# Patient Record
Sex: Male | Born: 1935 | Race: White | Hispanic: No | Smoking: Former smoker
Health system: Southern US, Community
[De-identification: ages and names within clinical notes are randomized; demographics above are authoritative.]

## PROBLEM LIST (undated history)

## (undated) DIAGNOSIS — K922 Gastrointestinal hemorrhage, unspecified: Secondary | ICD-10-CM

## (undated) DIAGNOSIS — H9190 Unspecified hearing loss, unspecified ear: Secondary | ICD-10-CM

## (undated) DIAGNOSIS — I739 Peripheral vascular disease, unspecified: Secondary | ICD-10-CM

## (undated) DIAGNOSIS — N189 Chronic kidney disease, unspecified: Secondary | ICD-10-CM

## (undated) DIAGNOSIS — E119 Type 2 diabetes mellitus without complications: Secondary | ICD-10-CM

## (undated) DIAGNOSIS — C8590 Non-Hodgkin lymphoma, unspecified, unspecified site: Secondary | ICD-10-CM

## (undated) DIAGNOSIS — I499 Cardiac arrhythmia, unspecified: Secondary | ICD-10-CM

## (undated) DIAGNOSIS — I10 Essential (primary) hypertension: Secondary | ICD-10-CM

## (undated) DIAGNOSIS — E781 Pure hyperglyceridemia: Secondary | ICD-10-CM

## (undated) DIAGNOSIS — I4891 Unspecified atrial fibrillation: Secondary | ICD-10-CM

## (undated) DIAGNOSIS — K5090 Crohn's disease, unspecified, without complications: Secondary | ICD-10-CM

## (undated) HISTORY — PX: APPENDECTOMY: SHX54

## (undated) HISTORY — DX: Pure hyperglyceridemia: E78.1

## (undated) HISTORY — PX: TURP VAPORIZATION: SUR1397

---

## 2010-05-09 ENCOUNTER — Ambulatory Visit: Payer: Self-pay | Admitting: Ophthalmology

## 2010-05-20 ENCOUNTER — Ambulatory Visit: Payer: Self-pay | Admitting: Ophthalmology

## 2010-07-03 ENCOUNTER — Ambulatory Visit: Payer: Self-pay | Admitting: Ophthalmology

## 2010-07-08 ENCOUNTER — Ambulatory Visit: Payer: Self-pay | Admitting: Ophthalmology

## 2011-06-24 ENCOUNTER — Ambulatory Visit: Payer: Self-pay | Admitting: Physician Assistant

## 2013-10-10 ENCOUNTER — Ambulatory Visit: Payer: Self-pay | Admitting: Podiatry

## 2013-10-26 ENCOUNTER — Encounter: Payer: Self-pay | Admitting: *Deleted

## 2013-10-31 ENCOUNTER — Ambulatory Visit (INDEPENDENT_AMBULATORY_CARE_PROVIDER_SITE_OTHER): Payer: Medicare Other | Admitting: Podiatry

## 2013-10-31 ENCOUNTER — Encounter: Payer: Self-pay | Admitting: Podiatry

## 2013-10-31 VITALS — BP 136/83 | HR 75 | Resp 22 | Ht 68.0 in | Wt 180.0 lb

## 2013-10-31 DIAGNOSIS — M79609 Pain in unspecified limb: Secondary | ICD-10-CM

## 2013-10-31 DIAGNOSIS — B351 Tinea unguium: Secondary | ICD-10-CM

## 2013-10-31 NOTE — Progress Notes (Signed)
Spencer Acosta presents today with a chief complaint of painful toenails one through 5 bilateral.  Objective: Pulses are strongly palpable bilateral nails are thick yellow dystrophic clinically mycotic and painful on palpation as well as debridement.  Assessment: Pain in limb secondary to onychomycosis 1 through 5 bilateral.  Plan: Debridement of nails 1 through 5 bilateral covered service secondary to pain.

## 2014-01-30 ENCOUNTER — Ambulatory Visit: Payer: Medicare Other | Admitting: Podiatry

## 2014-02-01 ENCOUNTER — Encounter: Payer: Self-pay | Admitting: Podiatry

## 2014-02-01 ENCOUNTER — Ambulatory Visit (INDEPENDENT_AMBULATORY_CARE_PROVIDER_SITE_OTHER): Payer: Medicare Other | Admitting: Podiatry

## 2014-02-01 VITALS — BP 130/81 | HR 79 | Resp 18

## 2014-02-01 DIAGNOSIS — M79609 Pain in unspecified limb: Secondary | ICD-10-CM

## 2014-02-01 DIAGNOSIS — B351 Tinea unguium: Secondary | ICD-10-CM

## 2014-02-01 NOTE — Progress Notes (Signed)
Trim toenails.  Objective: Vital signs are stable alert and oriented x3. Nails are thick yellow dystrophic with mycotic and painful palpation.  Assessment: Pain in limb secondary to onychomycosis 1 through 5 bilateral.  Plan: Debridement of nails 1 through 5 bilateral covered service secondary to pain.

## 2014-03-11 DIAGNOSIS — E781 Pure hyperglyceridemia: Secondary | ICD-10-CM

## 2014-03-11 DIAGNOSIS — I739 Peripheral vascular disease, unspecified: Secondary | ICD-10-CM | POA: Insufficient documentation

## 2014-03-11 DIAGNOSIS — I10 Essential (primary) hypertension: Secondary | ICD-10-CM | POA: Insufficient documentation

## 2014-03-11 DIAGNOSIS — K5010 Crohn's disease of large intestine without complications: Secondary | ICD-10-CM | POA: Insufficient documentation

## 2014-03-11 DIAGNOSIS — E119 Type 2 diabetes mellitus without complications: Secondary | ICD-10-CM | POA: Insufficient documentation

## 2014-03-11 HISTORY — DX: Pure hyperglyceridemia: E78.1

## 2014-05-10 ENCOUNTER — Ambulatory Visit (INDEPENDENT_AMBULATORY_CARE_PROVIDER_SITE_OTHER): Payer: Medicare Other | Admitting: Podiatry

## 2014-05-10 ENCOUNTER — Encounter: Payer: Self-pay | Admitting: Podiatry

## 2014-05-10 DIAGNOSIS — M79609 Pain in unspecified limb: Secondary | ICD-10-CM

## 2014-05-10 DIAGNOSIS — B351 Tinea unguium: Secondary | ICD-10-CM

## 2014-05-10 NOTE — Progress Notes (Signed)
He presents today chief complaint of painful elongated toenails.  Objective: Nails are thick yellow dystrophic onychomycotic and painful palpation.  Assessment: Pain in limb secondary to onychomycosis 1 through 5 bilateral.  Plan: Debridement of nails in thickness and length as a covered service secondary to pain.

## 2014-08-09 ENCOUNTER — Ambulatory Visit (INDEPENDENT_AMBULATORY_CARE_PROVIDER_SITE_OTHER): Payer: Medicare Other | Admitting: Podiatry

## 2014-08-09 DIAGNOSIS — B351 Tinea unguium: Secondary | ICD-10-CM

## 2014-08-09 DIAGNOSIS — M79676 Pain in unspecified toe(s): Secondary | ICD-10-CM

## 2014-08-09 DIAGNOSIS — M79609 Pain in unspecified limb: Secondary | ICD-10-CM

## 2014-08-09 NOTE — Progress Notes (Signed)
She presents today with a chief complaint of painful elongated toenails one through 5 bilateral. ° °Objective: Nails are thick yellow dystrophic with mycotic and painful palpation. ° °Assessment: Pain in limb secondary to onychomycosis 1 through 5 bilateral. ° °Plan: Debridement of nails 1 through 5 bilateral. °

## 2014-10-01 DIAGNOSIS — I700 Atherosclerosis of aorta: Secondary | ICD-10-CM | POA: Insufficient documentation

## 2014-11-06 ENCOUNTER — Ambulatory Visit (INDEPENDENT_AMBULATORY_CARE_PROVIDER_SITE_OTHER): Payer: Medicare Other | Admitting: Podiatry

## 2014-11-06 ENCOUNTER — Ambulatory Visit: Payer: PRIVATE HEALTH INSURANCE | Admitting: Podiatry

## 2014-11-06 DIAGNOSIS — B351 Tinea unguium: Secondary | ICD-10-CM

## 2014-11-06 DIAGNOSIS — M79676 Pain in unspecified toe(s): Secondary | ICD-10-CM

## 2014-11-06 NOTE — Progress Notes (Signed)
She presents today with a chief complaint of painful elongated toenails one through 5 bilateral. ° °Objective: Nails are thick yellow dystrophic with mycotic and painful palpation. ° °Assessment: Pain in limb secondary to onychomycosis 1 through 5 bilateral. ° °Plan: Debridement of nails 1 through 5 bilateral. °

## 2015-02-05 ENCOUNTER — Ambulatory Visit (INDEPENDENT_AMBULATORY_CARE_PROVIDER_SITE_OTHER): Payer: Medicare Other | Admitting: Podiatry

## 2015-02-05 DIAGNOSIS — B351 Tinea unguium: Secondary | ICD-10-CM | POA: Diagnosis not present

## 2015-02-05 DIAGNOSIS — M79676 Pain in unspecified toe(s): Secondary | ICD-10-CM | POA: Diagnosis not present

## 2015-02-05 NOTE — Progress Notes (Signed)
She presents today with a chief complaint of painful elongated toenails one through 5 bilateral.  Objective: Nails are thick yellow dystrophic with mycotic and painful palpation.  Assessment: Pain in limb secondary to onychomycosis 1 through 5 bilateral.  Plan: Debridement of nails 1 through 5 bilateral.

## 2015-06-11 ENCOUNTER — Ambulatory Visit: Payer: Medicare Other

## 2015-06-25 ENCOUNTER — Ambulatory Visit: Payer: Medicare Other

## 2015-07-12 ENCOUNTER — Ambulatory Visit: Payer: Medicare Other

## 2015-07-16 ENCOUNTER — Ambulatory Visit (INDEPENDENT_AMBULATORY_CARE_PROVIDER_SITE_OTHER): Payer: Medicare Other | Admitting: Podiatry

## 2015-07-16 DIAGNOSIS — M79676 Pain in unspecified toe(s): Secondary | ICD-10-CM | POA: Diagnosis not present

## 2015-07-16 DIAGNOSIS — B351 Tinea unguium: Secondary | ICD-10-CM

## 2015-07-16 NOTE — Progress Notes (Signed)
She presents today with a chief complaint of painful elongated toenails one through 5 bilateral.  Objective: Nails are thick yellow dystrophic with mycotic and painful palpation.  Assessment: Pain in limb secondary to onychomycosis 1 through 5 bilateral.  Plan: Debridement of nails 1 through 5 bilateral foot. 3 month follow-up.

## 2015-07-19 DIAGNOSIS — K649 Unspecified hemorrhoids: Secondary | ICD-10-CM | POA: Insufficient documentation

## 2015-10-15 ENCOUNTER — Ambulatory Visit: Payer: Medicare Other | Admitting: Podiatry

## 2015-10-16 ENCOUNTER — Encounter: Payer: Self-pay | Admitting: Sports Medicine

## 2015-10-16 ENCOUNTER — Ambulatory Visit (INDEPENDENT_AMBULATORY_CARE_PROVIDER_SITE_OTHER): Payer: Medicare Other | Admitting: Sports Medicine

## 2015-10-16 DIAGNOSIS — B351 Tinea unguium: Secondary | ICD-10-CM

## 2015-10-16 DIAGNOSIS — M79676 Pain in unspecified toe(s): Secondary | ICD-10-CM

## 2015-10-16 NOTE — Progress Notes (Signed)
Patient ID: Spencer Acosta, male   DOB: [birth year], 81 y.o.   MRN: [id number] Subjective: Spencer Acosta is a 81 y.o. male patient seen today in office with complaint of painful thickened and elongated toenails; unable to trim. Patient denies history of Diabetes, Neuropathy, or Vascular disease. Patient has no other pedal complaints at this time.   There are no active problems to display for this patient.  Current Outpatient Prescriptions on File Prior to Visit  Medication Sig Dispense Refill  . Cholecalciferol (VITAMIN D-1000 MAX ST) 1000 UNITS tablet Take by mouth.    . hydrochlorothiazide (HYDRODIURIL) 12.5 MG tablet Take by mouth.    . mesalamine (ASACOL) 400 MG EC tablet Take 400 mg by mouth 4 (four) times daily.     No current facility-administered medications on file prior to visit.   Allergies  Allergen Reactions  . Sulfa Antibiotics Nausea And Vomiting    Objective: Physical Exam  General: Well developed, nourished, no acute distress, awake, alert and oriented x 3  Vascular: Dorsalis pedis artery 1/4 bilateral, Posterior tibial artery 1/4 bilateral, skin temperature warm to warm proximal to distal bilateral lower extremities, + varicosities, scant pedal hair present bilateral.  Neurological: Gross sensation present via light touch bilateral.   Dermatological: Skin is warm, dry, and supple bilateral, Nails 1-10 are tender, long, thick, and discolored with mild subungal debris, no webspace macerations present bilateral, no open lesions present bilateral, no callus/corns/hyperkeratotic tissue present bilateral. No signs of infection bilateral.  Musculoskeletal: Asymptomatic hammertoe deformities noted bilateral. Muscular strength within normal limits without pain or limitation on range of motion. No pain with calf compression bilateral.  Assessment and Plan:  Problem List Items Addressed This Visit    None    Visit Diagnoses    Dermatophytosis of nail    -  Primary    Pain of  toe, unspecified laterality          -Examined patient.  -Discussed treatment options for painful mycotic nails. -Mechanically debrided and reduced mycotic nails with sterile nail nipper and dremel nail file without incident. -Recommend continue with good supportive shoes daily.  -Patient to return in 3 months for follow up evaluation or sooner if symptoms worsen.  Landis Martins, DPM

## 2015-10-17 ENCOUNTER — Ambulatory Visit: Payer: Medicare Other

## 2016-01-22 ENCOUNTER — Ambulatory Visit (INDEPENDENT_AMBULATORY_CARE_PROVIDER_SITE_OTHER): Payer: Medicare Other | Admitting: Sports Medicine

## 2016-01-22 ENCOUNTER — Encounter: Payer: Self-pay | Admitting: Sports Medicine

## 2016-01-22 DIAGNOSIS — B351 Tinea unguium: Secondary | ICD-10-CM | POA: Diagnosis not present

## 2016-01-22 DIAGNOSIS — M79676 Pain in unspecified toe(s): Secondary | ICD-10-CM

## 2016-01-22 NOTE — Progress Notes (Signed)
Patient ID: RYLEND BATTENFIELD, male   DOB: 14-Jan-1915, 82 y.o.   MRN: [id number]  Subjective: Spencer Acosta is a 82 y.o. male patient seen today in office with complaint of painful thickened and elongated toenails; unable to trim. Patient denies history of Diabetes, Neuropathy, or Vascular disease. Patient has no other pedal complaints at this time.   There are no active problems to display for this patient.  Current Outpatient Prescriptions on File Prior to Visit  Medication Sig Dispense Refill  . Cholecalciferol (VITAMIN D-1000 MAX ST) 1000 UNITS tablet Take by mouth.    . hydrochlorothiazide (HYDRODIURIL) 12.5 MG tablet Take by mouth.    . mesalamine (ASACOL) 400 MG EC tablet Take 400 mg by mouth 4 (four) times daily.     No current facility-administered medications on file prior to visit.   Allergies  Allergen Reactions  . Sulfa Antibiotics Nausea And Vomiting    Objective: Physical Exam  General: Well developed, nourished, no acute distress, awake, alert and oriented x 3  Vascular: Dorsalis pedis artery 1/4 bilateral, Posterior tibial artery 1/4 bilateral, skin temperature warm to warm proximal to distal bilateral lower extremities, + varicosities, scant pedal hair present bilateral.  Neurological: Gross sensation present via light touch bilateral.   Dermatological: Skin is warm, dry, and supple bilateral, Nails 1-10 are tender, long, thick, and discolored with mild subungal debris, no webspace macerations present bilateral, no open lesions present bilateral, no callus/corns/hyperkeratotic tissue present bilateral. No signs of infection bilateral.  Musculoskeletal: Asymptomatic hammertoe deformities noted bilateral. Muscular strength within normal limits without pain or limitation on range of motion. No pain with calf compression bilateral.  Assessment and Plan:  Problem List Items Addressed This Visit    None    Visit Diagnoses    Dermatophytosis of nail    -  Primary    Pain of  toe, unspecified laterality          -Examined patient.  -Discussed treatment options for painful mycotic nails. -Mechanically debrided and reduced mycotic nails with sterile nail nipper and dremel nail file without incident. -Recommend continue with good supportive shoes daily.  -Patient to return in 3 months for follow up evaluation or sooner if symptoms worsen.  Landis Martins, DPM

## 2016-02-19 ENCOUNTER — Encounter: Payer: Self-pay | Admitting: Emergency Medicine

## 2016-02-19 ENCOUNTER — Inpatient Hospital Stay
Admission: EM | Admit: 2016-02-19 | Discharge: 2016-02-22 | DRG: 372 | Disposition: A | Discharge status: Skilled Nursing Facility | Payer: Medicare Other | Attending: Internal Medicine | Admitting: Internal Medicine

## 2016-02-19 ENCOUNTER — Inpatient Hospital Stay: Payer: Medicare Other

## 2016-02-19 ENCOUNTER — Emergency Department: Payer: Medicare Other

## 2016-02-19 DIAGNOSIS — N183 Chronic kidney disease, stage 3 (moderate): Secondary | ICD-10-CM | POA: Diagnosis present

## 2016-02-19 DIAGNOSIS — Z882 Allergy status to sulfonamides status: Secondary | ICD-10-CM | POA: Diagnosis not present

## 2016-02-19 DIAGNOSIS — E785 Hyperlipidemia, unspecified: Secondary | ICD-10-CM | POA: Diagnosis present

## 2016-02-19 DIAGNOSIS — I129 Hypertensive chronic kidney disease with stage 1 through stage 4 chronic kidney disease, or unspecified chronic kidney disease: Secondary | ICD-10-CM | POA: Diagnosis present

## 2016-02-19 DIAGNOSIS — I482 Chronic atrial fibrillation, unspecified: Secondary | ICD-10-CM

## 2016-02-19 DIAGNOSIS — N179 Acute kidney failure, unspecified: Secondary | ICD-10-CM | POA: Diagnosis present

## 2016-02-19 DIAGNOSIS — E1122 Type 2 diabetes mellitus with diabetic chronic kidney disease: Secondary | ICD-10-CM | POA: Diagnosis present

## 2016-02-19 DIAGNOSIS — A047 Enterocolitis due to Clostridium difficile: Secondary | ICD-10-CM | POA: Diagnosis not present

## 2016-02-19 DIAGNOSIS — Z87891 Personal history of nicotine dependence: Secondary | ICD-10-CM | POA: Diagnosis not present

## 2016-02-19 DIAGNOSIS — E876 Hypokalemia: Secondary | ICD-10-CM | POA: Diagnosis present

## 2016-02-19 DIAGNOSIS — I739 Peripheral vascular disease, unspecified: Secondary | ICD-10-CM | POA: Diagnosis present

## 2016-02-19 DIAGNOSIS — E860 Dehydration: Secondary | ICD-10-CM | POA: Diagnosis present

## 2016-02-19 DIAGNOSIS — K922 Gastrointestinal hemorrhage, unspecified: Secondary | ICD-10-CM

## 2016-02-19 DIAGNOSIS — K5730 Diverticulosis of large intestine without perforation or abscess without bleeding: Secondary | ICD-10-CM | POA: Diagnosis present

## 2016-02-19 DIAGNOSIS — Z79899 Other long term (current) drug therapy: Secondary | ICD-10-CM

## 2016-02-19 DIAGNOSIS — K5010 Crohn's disease of large intestine without complications: Secondary | ICD-10-CM | POA: Diagnosis present

## 2016-02-19 DIAGNOSIS — Z66 Do not resuscitate: Secondary | ICD-10-CM | POA: Diagnosis present

## 2016-02-19 DIAGNOSIS — H409 Unspecified glaucoma: Secondary | ICD-10-CM | POA: Diagnosis present

## 2016-02-19 DIAGNOSIS — K529 Noninfective gastroenteritis and colitis, unspecified: Secondary | ICD-10-CM | POA: Diagnosis present

## 2016-02-19 HISTORY — DX: Essential (primary) hypertension: I10

## 2016-02-19 HISTORY — DX: Peripheral vascular disease, unspecified: I73.9

## 2016-02-19 HISTORY — DX: Crohn's disease, unspecified, without complications: K50.90

## 2016-02-19 HISTORY — DX: Unspecified atrial fibrillation: I48.91

## 2016-02-19 HISTORY — DX: Chronic kidney disease, unspecified: N18.9

## 2016-02-19 HISTORY — DX: Type 2 diabetes mellitus without complications: E11.9

## 2016-02-19 LAB — COMPREHENSIVE METABOLIC PANEL
ALBUMIN: 3.1 g/dL — AB (ref 3.5–5.0)
ALK PHOS: 55 U/L (ref 38–126)
ALT: 12 U/L — AB (ref 17–63)
AST: 18 U/L (ref 15–41)
Anion gap: 9 (ref 5–15)
BUN: 40 mg/dL — AB (ref 6–20)
CALCIUM: 7.7 mg/dL — AB (ref 8.9–10.3)
CHLORIDE: 104 mmol/L (ref 101–111)
CO2: 26 mmol/L (ref 22–32)
CREATININE: 1.61 mg/dL — AB (ref 0.61–1.24)
GFR calc non Af Amer: 34 mL/min — ABNORMAL LOW (ref >60)
GFR, EST AFRICAN AMERICAN: 39 mL/min — AB (ref >60)
GLUCOSE: 155 mg/dL — AB (ref 65–99)
Potassium: 2.5 mmol/L — CL (ref 3.5–5.1)
SODIUM: 139 mmol/L (ref 135–145)
Total Bilirubin: 1 mg/dL (ref 0.3–1.2)
Total Protein: 6 g/dL — ABNORMAL LOW (ref 6.5–8.1)

## 2016-02-19 LAB — LIPASE, BLOOD: Lipase: 16 U/L (ref 11–51)

## 2016-02-19 LAB — CBC WITH DIFFERENTIAL/PLATELET
Basophils Absolute: 0 10*3/uL (ref 0–0.1)
Basophils Relative: 0 %
EOS ABS: 0 10*3/uL (ref 0–0.7)
EOS PCT: 0 %
HCT: 36.4 % — ABNORMAL LOW (ref 40.0–52.0)
HEMOGLOBIN: 12.2 g/dL — AB (ref 13.0–18.0)
LYMPHS ABS: 1.1 10*3/uL (ref 1.0–3.6)
LYMPHS PCT: 9 %
MCH: 31.2 pg (ref 26.0–34.0)
MCHC: 33.4 g/dL (ref 32.0–36.0)
MCV: 93.5 fL (ref 80.0–100.0)
MONOS PCT: 10 %
Monocytes Absolute: 1.2 10*3/uL — ABNORMAL HIGH (ref 0.2–1.0)
Neutro Abs: 9.7 10*3/uL — ABNORMAL HIGH (ref 1.4–6.5)
Neutrophils Relative %: 81 %
PLATELETS: 230 10*3/uL (ref 150–440)
RBC: 3.89 MIL/uL — AB (ref 4.40–5.90)
RDW: 14.7 % — ABNORMAL HIGH (ref 11.5–14.5)
WBC: 12.1 10*3/uL — AB (ref 3.8–10.6)

## 2016-02-19 LAB — PROTIME-INR
INR: 1.38
Prothrombin Time: 17.1 seconds — ABNORMAL HIGH (ref 11.4–15.0)

## 2016-02-19 LAB — ABO/RH: ABO/RH(D): A POS

## 2016-02-19 LAB — TYPE AND SCREEN
ABO/RH(D): A POS
ANTIBODY SCREEN: NEGATIVE

## 2016-02-19 LAB — HEMOGLOBIN: Hemoglobin: 11.5 g/dL — ABNORMAL LOW (ref 13.0–18.0)

## 2016-02-19 MED ORDER — ONDANSETRON HCL 4 MG PO TABS: 4.0000 mg | ORAL_TABLET | Freq: Four times a day (QID) | ORAL | Status: DC | PRN

## 2016-02-19 MED ORDER — ACETAMINOPHEN 650 MG RE SUPP: 650.0000 mg | Freq: Four times a day (QID) | RECTAL | Status: DC | PRN

## 2016-02-19 MED ORDER — IOHEXOL 240 MG/ML SOLN
25.0000 mL | INTRAMUSCULAR | Status: AC
  Administered 2016-02-19: 25 mL via ORAL

## 2016-02-19 MED ORDER — SODIUM CHLORIDE 0.9 % IV BOLUS (SEPSIS)
1000.0000 mL | Freq: Once | INTRAVENOUS | Status: AC
  Administered 2016-02-19: 1000 mL via INTRAVENOUS

## 2016-02-19 MED ORDER — ENOXAPARIN SODIUM 30 MG/0.3ML ~~LOC~~ SOLN: 30.0000 mg | SUBCUTANEOUS | Status: DC

## 2016-02-19 MED ORDER — MESALAMINE 400 MG PO CPDR
400.0000 mg | DELAYED_RELEASE_CAPSULE | Freq: Four times a day (QID) | ORAL | Status: DC
  Administered 2016-02-19 – 2016-02-20 (×3): 400 mg via ORAL
  Filled 2016-02-19 (×6): qty 1

## 2016-02-19 MED ORDER — POTASSIUM CHLORIDE IN NACL 20-0.9 MEQ/L-% IV SOLN
INTRAVENOUS | Status: DC
  Administered 2016-02-19 – 2016-02-22 (×4): via INTRAVENOUS
  Filled 2016-02-19 (×7): qty 1000

## 2016-02-19 MED ORDER — POTASSIUM CHLORIDE 10 MEQ/100ML IV SOLN
10.0000 meq | INTRAVENOUS | Status: AC
  Administered 2016-02-19 (×2): 10 meq via INTRAVENOUS
  Filled 2016-02-19 (×4): qty 100

## 2016-02-19 MED ORDER — METOPROLOL TARTRATE 25 MG PO TABS
25.0000 mg | ORAL_TABLET | Freq: Two times a day (BID) | ORAL | Status: DC
  Administered 2016-02-19 – 2016-02-22 (×5): 25 mg via ORAL
  Filled 2016-02-19 (×6): qty 1

## 2016-02-19 MED ORDER — ENOXAPARIN SODIUM 40 MG/0.4ML ~~LOC~~ SOLN: 40.0000 mg | SUBCUTANEOUS | Status: DC

## 2016-02-19 MED ORDER — ACETAMINOPHEN 325 MG PO TABS: 650.0000 mg | ORAL_TABLET | Freq: Four times a day (QID) | ORAL | Status: DC | PRN

## 2016-02-19 MED ORDER — POTASSIUM CHLORIDE CRYS ER 20 MEQ PO TBCR
60.0000 meq | EXTENDED_RELEASE_TABLET | Freq: Once | ORAL | Status: AC
  Administered 2016-02-19: 60 meq via ORAL
  Filled 2016-02-19: qty 3

## 2016-02-19 MED ORDER — ONDANSETRON HCL 4 MG/2ML IJ SOLN: 4.0000 mg | Freq: Four times a day (QID) | INTRAMUSCULAR | Status: DC | PRN

## 2016-02-19 MED ORDER — VITAMIN D 1000 UNITS PO TABS
1000.0000 [IU] | ORAL_TABLET | Freq: Every day | ORAL | Status: DC
  Administered 2016-02-20 – 2016-02-22 (×3): 1000 [IU] via ORAL
  Filled 2016-02-19 (×3): qty 1

## 2016-02-19 MED ORDER — VANCOMYCIN 50 MG/ML ORAL SOLUTION
125.0000 mg | Freq: Four times a day (QID) | ORAL | Status: DC
  Administered 2016-02-19 – 2016-02-20 (×3): 125 mg via ORAL
  Filled 2016-02-19 (×7): qty 2.5

## 2016-02-19 MED ORDER — SODIUM CHLORIDE 0.9% FLUSH
3.0000 mL | Freq: Two times a day (BID) | INTRAVENOUS | Status: DC
  Administered 2016-02-19 – 2016-02-22 (×4): 3 mL via INTRAVENOUS

## 2016-02-19 NOTE — ED Notes (Signed)
Care was transferred to grace at this time.

## 2016-02-19 NOTE — ED Notes (Signed)
Pt here from [facility]; reports he's been having blood in his diarrhea x1 weeks; has been on flagyl for 3 weeks for C. Diff. EMS reports pt CBG 159.

## 2016-02-19 NOTE — ED Notes (Signed)
Pt's friend requesting to be called when pt receives room. Dwyane Luo [phone]

## 2016-02-19 NOTE — H&P (Signed)
Spencer Acosta at [facility] NAME: Spencer Acosta    MR#:  [id number]  DATE OF BIRTH:  12-03-1914  DATE OF ADMISSION:  02/19/2016  PRIMARY CARE PHYSICIAN: Spencer Acosta., MD   REQUESTING/REFERRING PHYSICIAN: Dr. Carrie Mew  CHIEF COMPLAINT:   Chief Complaint  Patient presents with  . Rectal Bleeding    HISTORY OF PRESENT ILLNESS:  Spencer Acosta  is a 82 y.o. male with a known history of hypertension, history of Crohn's colitis, atrial fibrillation not on anticoagulation due to his age, CKD presents from [facility] secondary to weakness and hypokalemia. Patient had been having diarrhea for 3 weeks now, diagnosed with C.diff and started on oral flagyl, symptoms did not improve after a week, so flagyl was continued for 3 weeks now. Patient says he is still having diarrhea with bloody stools. Stools have become much darker in the last couple of days. Decreased intake, denies any nausea or vomiting. Felt weak and dehydrated and went to the clinic today. Due to his fatigue and weakness and dark stools and ongoing diarrhea, he was sent to the emergency room. Labs indicate dehydration and also decreased potassium at 2.5. Stool for guaiac done in the emergency room is positive. Hemoglobin now is stable at 12.2  PAST MEDICAL HISTORY:   Past Medical History  Diagnosis Date  . Hypertension   . Diabetes mellitus without complication ([place])   . Crohn's disease ([place])   . A-fib ([place])   . Hyperlipidemia   . PVD (peripheral vascular disease) ([place])   . CKD (chronic kidney disease)     PAST SURGICAL HISTORY:   Past Surgical History  Procedure Laterality Date  . Appendectomy    . Turp vaporization      SOCIAL HISTORY:   Social History  Substance Use Topics  . Smoking status: Former Research scientist (life sciences)  . Smokeless tobacco: Never Used     Comment: quit 70 years ago  . Alcohol Use: 0.0 oz/week    0 Standard drinks or equivalent per  week     Comment: occasional    FAMILY HISTORY:  No family history on file.  DRUG ALLERGIES:   Allergies  Allergen Reactions  . Sulfa Antibiotics Nausea And Vomiting    REVIEW OF SYSTEMS:   Review of Systems  Constitutional: Positive for malaise/fatigue. Negative for fever, chills and weight loss.  HENT: Positive for hearing loss. Negative for ear discharge, ear pain, nosebleeds and tinnitus.   Eyes: Negative for blurred vision, double vision and photophobia.  Respiratory: Negative for cough, hemoptysis, shortness of breath and wheezing.   Cardiovascular: Negative for chest pain, palpitations, orthopnea and leg swelling.  Gastrointestinal: Positive for diarrhea and blood in stool. Negative for heartburn, nausea, vomiting, abdominal pain, constipation and melena.  Genitourinary: Negative for dysuria, urgency and frequency.  Musculoskeletal: Negative for myalgias, back pain and neck pain.  Skin: Negative for rash.  Neurological: Positive for weakness. Negative for dizziness, tingling, sensory change, speech change, focal weakness and headaches.  Endo/Heme/Allergies: Does not bruise/bleed easily.  Psychiatric/Behavioral: Negative for depression.    MEDICATIONS AT HOME:   Prior to Admission medications   Medication Sig Start Date End Date Taking? Authorizing Provider  cholecalciferol (VITAMIN D) 1000 units tablet Take 1,000 Units by mouth daily.   Yes Historical Provider, MD  hydrochlorothiazide (MICROZIDE) 12.5 MG capsule Take 12.5 mg by mouth daily.   Yes Historical Provider, MD  mesalamine (ASACOL) 400 MG EC tablet Take 400 mg by  mouth 4 (four) times daily.    Historical Provider, MD      VITAL SIGNS:  Blood pressure 119/90, pulse 94, temperature 97.9 F (36.6 C), temperature source Oral, resp. rate 20, height 5\' 8"  (1.727 m), weight 77.111 kg (170 lb), SpO2 98 %.  PHYSICAL EXAMINATION:   Physical Exam  GENERAL:  82 y.o.-year-old elderly patient lying in the bed with  no acute distress.  EYES: Pupils equal, round, reactive to light and accommodation. No scleral icterus. Extraocular muscles intact.  HEENT: Head atraumatic, normocephalic. Oropharynx and nasopharynx clear.  NECK:  Supple, no jugular venous distention. No thyroid enlargement, no tenderness.  LUNGS: Normal breath sounds bilaterally, no wheezing, rales,rhonchi or crepitation. No use of accessory muscles of respiration.  CARDIOVASCULAR: S1, S2 normal. Rapid and irregular. No rubs, or gallops. 3/6 systolic murmur present. ABDOMEN: Soft, nontender, nondistended. Bowel sounds present. No organomegaly or mass.  Guaiac is positive EXTREMITIES: No pedal edema, cyanosis, or clubbing.  NEUROLOGIC: Cranial nerves II through XII are intact. Muscle strength 5/5 in all extremities. Sensation intact. Gait not checked.  PSYCHIATRIC: The patient is alert and oriented x 3.  SKIN: No obvious rash, lesion, or ulcer.   LABORATORY PANEL:   CBC  Recent Labs Lab 02/19/16 1117  WBC 12.1*  HGB 12.2*  HCT 36.4*  PLT 230   ------------------------------------------------------------------------------------------------------------------  Chemistries   Recent Labs Lab 02/19/16 1117  NA 139  K 2.5*  CL 104  CO2 26  GLUCOSE 155*  BUN 40*  CREATININE 1.61*  CALCIUM 7.7*  AST 18  ALT 12*  ALKPHOS 55  BILITOT 1.0   ------------------------------------------------------------------------------------------------------------------  Cardiac Enzymes No results for input(s): TROPONINI in the last 168 hours. ------------------------------------------------------------------------------------------------------------------  RADIOLOGY:  No results found.  EKG:   Orders placed or performed during the hospital encounter of 02/19/16  . ED EKG  . ED EKG    IMPRESSION AND PLAN:   Spencer Acosta  is a 82 y.o. male with a known history of hypertension, history of Crohn's colitis, atrial fibrillation not on  anticoagulation due to his age, CKD presents from [facility] [place] [facility] secondary to weakness and hypokalemia.  #1 diarrhea with melena-started with C. difficile diarrhea about 3 weeks ago. Has been on Flagyl for 3 weeks now. -Recent stool cultures and stool for C. difficile. Started on oral vancomycin until PCR is back. -Also has underlying Crohn's colitis. Bloody diarrhea could be related to that. Hemoglobin is stable. -GI consulted. CT of the abdomen has been ordered. Continue mesalamine for now. -Check hemoglobin every 8 hours. Guaiac is positive -No abdominal pain or nausea and vomiting. Advance diet as tolerated -IV fluids for now.  #2 hypokalemia- due to diarrhea and also on HCTZ - replace potassium and recheck.  #3 atrial fibrillation- rate is elevated- not on any meds at home - HR elevated from dehydration, hypo[place].  - started IV fluids, started on oral metoprolol and also replace potassium -not on anticoagulation due to age Monitor on off unit tele  #4 CKD- baseline cr of 1.1, cr now at 1.6- pre renal from poor intake Monitor, avoid nephrotoxins  #5 hypertension- hold hctz due to hypokalemia On metoprolol now  #6 DVT prophylaxis-on Lovenox   No family locally. Patient is from [facility]. 2 daughters in [place]. He is a DO NOT RESUSCITATE based on his paperwork.   All the records are reviewed and case discussed with ED provider. Management plans discussed with the patient, family and they are  in agreement.  CODE STATUS: DO NOT RESUSCITATE  TOTAL TIME TAKING CARE OF THIS PATIENT: 55 minutes.    Gladstone Lighter M.D on 02/19/2016 at 3:20 PM  Between 7am to 6pm - Pager - [phone]  After 6pm go to [email or web address] - password [email or web address] [facility]  Office  [phone]  CC: Primary care physician; Spencer Acosta., MD

## 2016-02-19 NOTE — ED Provider Notes (Signed)
[facility] Emergency Department Provider Note  ____________________________________________  Time seen: 10:50 AM  I have reviewed the triage vital signs and the nursing notes.   HISTORY  Chief Complaint Rectal Bleeding    HPI Spencer Acosta is a 82 y.o. male sent to the ED due to suspected dehydration. The patient has had C. difficile for the past 3 weeks and has had been on Flagyl for the past 3 weeks without resolution. He still having diarrhea. He is also complaining of shortness of breath with exertion and generalized fatigue and weakness.  He lives in [facility] [place] [facility]. He still drives. He is a DO NOT RESUSCITATE CODE STATUS.  Denies chest pain. No syncope or falls or head injury. Has also noticed bloody stools over the past week. No vomiting     Past Medical History  Diagnosis Date  . Hypertension   . Diabetes mellitus without complication ([place])   . Crohn's disease ([place])      There are no active problems to display for this patient.    Past Surgical History  Procedure Laterality Date  . Appendectomy       Current Outpatient Rx  Name  Route  Sig  Dispense  Refill  . Cholecalciferol (VITAMIN D-1000 MAX ST) 1000 UNITS tablet   Oral   Take by mouth.         . hydrochlorothiazide (HYDRODIURIL) 12.5 MG tablet   Oral   Take by mouth.         . mesalamine (ASACOL) 400 MG EC tablet   Oral   Take 400 mg by mouth 4 (four) times daily.            Allergies Sulfa antibiotics   No family history on file.  Social History Social History  Substance Use Topics  . Smoking status: Former Research scientist (life sciences)  . Smokeless tobacco: Never Used     Comment: quit 70 years ago  . Alcohol Use: Yes    Review of Systems  Constitutional:   No fever or chills. No weight changes Eyes:   No blurry vision or double vision.  ENT:   No sore throat.  Cardiovascular:   No chest pain. Respiratory:   No dyspnea or cough. Gastrointestinal:    Negative for abdominal pain, vomiting And positive diarrhea 3 weeks.  Occasional bloody stool Genitourinary:   Negative for dysuria or difficulty urinating. Musculoskeletal:   Negative for back pain. No joint swelling or pain. Skin:   Negative for rash. Neurological:   Negative for headaches, focal weakness or numbness. Psychiatric:  No anxiety or depression.     10-point ROS otherwise negative.  ____________________________________________   PHYSICAL EXAM:  VITAL SIGNS: ED Triage Vitals  Enc Vitals Group     BP 02/19/16 1108 111/70 mmHg     Pulse Rate 02/19/16 1108 119     Resp 02/19/16 1108 16     Temp 02/19/16 1108 97.9 F (36.6 C)     Temp Source 02/19/16 1108 Oral     SpO2 02/19/16 1108 98 %     Weight 02/19/16 1108 170 lb (77.111 kg)     Height 02/19/16 1108 5\' 8"  (1.727 m)     Head Cir --      Peak Flow --      Pain Score 02/19/16 1108 0     Pain Loc --      Pain Edu? --      Excl. in [place]? --  Vital signs reviewed, nursing assessments reviewed.   Constitutional:   Alert and oriented. Low energy.. Eyes:   No scleral icterus. No conjunctival pallor. PERRL. EOMI ENT   Head:   Normocephalic and atraumatic.   Nose:   No congestion/rhinnorhea. No septal hematoma   Mouth/Throat:   Dry mucous membranes, no pharyngeal erythema. No peritonsillar mass.    Neck:   No stridor. No SubQ emphysema. No meningismus. Hematological/Lymphatic/Immunilogical:   No cervical lymphadenopathy. Cardiovascular:   Irregularly irregular rhythm, heart rate 12/31/1936. Symmetric bilateral radial and DP pulses.  No murmurs.  Respiratory:   Normal respiratory effort without tachypnea nor retractions. Breath sounds are clear and equal bilaterally. No wheezes/rales/rhonchi. Gastrointestinal:   Soft and nontender. Mildly distended. There is no CVA tenderness.  No rebound, rigidity, or guarding. Rectal exam reveals liquid brown stool, Hemoccult positive Genitourinary:    deferred Musculoskeletal:   Nontender with normal range of motion in all extremities. No joint effusions.  No lower extremity tenderness.  No edema. Neurologic:   Normal speech and language.  CN 2-10 normal. Motor grossly intact. No gross focal neurologic deficits are appreciated.  Skin:    Skin is warm, dry and intact. No rash noted.  No petechiae, purpura, or bullae. Psychiatric:   Mood and affect are normal. ____________________________________________    LABS (pertinent positives/negatives) (all labs ordered are listed, but only abnormal results are displayed) Labs Reviewed  COMPREHENSIVE METABOLIC PANEL - Abnormal; Notable for the following:    Potassium 2.5 (*)    Glucose, Bld 155 (*)    BUN 40 (*)    Creatinine, Ser 1.61 (*)    Calcium 7.7 (*)    Total Protein 6.0 (*)    Albumin 3.1 (*)    ALT 12 (*)    GFR calc non Af Amer 34 (*)    GFR calc Af Amer 39 (*)    All other components within normal limits  CBC WITH DIFFERENTIAL/PLATELET - Abnormal; Notable for the following:    WBC 12.1 (*)    RBC 3.89 (*)    Hemoglobin 12.2 (*)    HCT 36.4 (*)    RDW 14.7 (*)    Neutro Abs 9.7 (*)    Monocytes Absolute 1.2 (*)    All other components within normal limits  PROTIME-INR - Abnormal; Notable for the following:    Prothrombin Time 17.1 (*)    All other components within normal limits  LIPASE, BLOOD  TYPE AND SCREEN  ABO/RH   ____________________________________________   EKG  Interpreted by me Atrial fibrillation rate 112, normal axis and intervals. Poor R-wave progression in anterior precordial leads. Normal ST segments and T waves.  ____________________________________________    RADIOLOGY  CT abdomen and pelvis pending  ____________________________________________   PROCEDURES   ____________________________________________   INITIAL IMPRESSION / ASSESSMENT AND PLAN / ED COURSE  Pertinent labs & imaging results that were available during my care of  the patient were reviewed by me and considered in my medical decision making (see chart for details).  Patient presents with age of fibrillation with rapid ventricular response, clinically dehydrated. We'll give IV fluids for this. Also Hemoccult positive with GI bleed and persistent diarrhea in the setting of C. difficile that failing outpatient treatment with Flagyl. Also has a history of Crohn's, and also has hypokalemia on labs with acute renal insufficiency. Baseline BUN and creatinine is 30 over 1.1. Continue IV fluids, admit for further management. Case discussed with the hospitalist at 3:00 PM.  ____________________________________________   FINAL CLINICAL IMPRESSION(S) / ED DIAGNOSES  Final diagnoses:  Dehydration  Acute renal failure, unspecified acute renal failure type (HCC)  Chronic atrial fibrillation (HCC)  Gastrointestinal hemorrhage, unspecified gastritis, unspecified gastrointestinal hemorrhage type  Hypokalemia      Carrie Mew, MD 02/19/16 [phone]

## 2016-02-20 LAB — BASIC METABOLIC PANEL
Anion gap: 5 (ref 5–15)
BUN: 33 mg/dL — ABNORMAL HIGH (ref 6–20)
CALCIUM: 7.5 mg/dL — AB (ref 8.9–10.3)
CO2: 25 mmol/L (ref 22–32)
CREATININE: 1.27 mg/dL — AB (ref 0.61–1.24)
Chloride: 111 mmol/L (ref 101–111)
GFR calc non Af Amer: 45 mL/min — ABNORMAL LOW (ref >60)
GFR, EST AFRICAN AMERICAN: 52 mL/min — AB (ref >60)
Glucose, Bld: 98 mg/dL (ref 65–99)
Potassium: 3.3 mmol/L — ABNORMAL LOW (ref 3.5–5.1)
SODIUM: 141 mmol/L (ref 135–145)

## 2016-02-20 LAB — C DIFFICILE QUICK SCREEN W PCR REFLEX
C DIFFICLE (CDIFF) ANTIGEN: POSITIVE — AB
C Diff toxin: NEGATIVE

## 2016-02-20 LAB — GASTROINTESTINAL PANEL BY PCR, STOOL (REPLACES STOOL CULTURE)
ADENOVIRUS F40/41: NOT DETECTED
ASTROVIRUS: NOT DETECTED
CYCLOSPORA CAYETANENSIS: NOT DETECTED
Campylobacter species: NOT DETECTED
Cryptosporidium: NOT DETECTED
E. COLI O157: NOT DETECTED
ENTAMOEBA HISTOLYTICA: NOT DETECTED
ENTEROAGGREGATIVE E COLI (EAEC): NOT DETECTED
ENTEROPATHOGENIC E COLI (EPEC): NOT DETECTED
ENTEROTOXIGENIC E COLI (ETEC): NOT DETECTED
Giardia lamblia: NOT DETECTED
NOROVIRUS GI/GII: NOT DETECTED
Plesimonas shigelloides: NOT DETECTED
Rotavirus A: NOT DETECTED
SAPOVIRUS (I, II, IV, AND V): NOT DETECTED
Salmonella species: NOT DETECTED
Shiga like toxin producing E coli (STEC): NOT DETECTED
Shigella/Enteroinvasive E coli (EIEC): NOT DETECTED
VIBRIO CHOLERAE: NOT DETECTED
VIBRIO SPECIES: NOT DETECTED
Yersinia enterocolitica: NOT DETECTED

## 2016-02-20 LAB — CBC
HCT: 32.8 % — ABNORMAL LOW (ref 40.0–52.0)
Hemoglobin: 11 g/dL — ABNORMAL LOW (ref 13.0–18.0)
MCH: 31.9 pg (ref 26.0–34.0)
MCHC: 33.5 g/dL (ref 32.0–36.0)
MCV: 95.3 fL (ref 80.0–100.0)
PLATELETS: 194 10*3/uL (ref 150–440)
RBC: 3.44 MIL/uL — AB (ref 4.40–5.90)
RDW: 14.9 % — AB (ref 11.5–14.5)
WBC: 12.4 10*3/uL — AB (ref 3.8–10.6)

## 2016-02-20 LAB — MRSA PCR SCREENING: MRSA BY PCR: NEGATIVE

## 2016-02-20 LAB — HEMOGLOBIN
Hemoglobin: 11.5 g/dL — ABNORMAL LOW (ref 13.0–18.0)
Hemoglobin: 12.1 g/dL — ABNORMAL LOW (ref 13.0–18.0)

## 2016-02-20 LAB — MAGNESIUM: MAGNESIUM: 1.9 mg/dL (ref 1.7–2.4)

## 2016-02-20 LAB — CLOSTRIDIUM DIFFICILE BY PCR: Toxigenic C. Difficile by PCR: POSITIVE — AB

## 2016-02-20 MED ORDER — VANCOMYCIN 50 MG/ML ORAL SOLUTION
250.0000 mg | Freq: Four times a day (QID) | ORAL | Status: DC
  Administered 2016-02-20 – 2016-02-22 (×7): 250 mg via ORAL
  Filled 2016-02-20 (×11): qty 5

## 2016-02-20 MED ORDER — MESALAMINE 400 MG PO CPDR
800.0000 mg | DELAYED_RELEASE_CAPSULE | Freq: Three times a day (TID) | ORAL | Status: DC
  Administered 2016-02-20 – 2016-02-22 (×5): 800 mg via ORAL
  Filled 2016-02-20 (×5): qty 2

## 2016-02-20 MED ORDER — POTASSIUM CHLORIDE CRYS ER 20 MEQ PO TBCR
40.0000 meq | EXTENDED_RELEASE_TABLET | Freq: Once | ORAL | Status: AC
  Administered 2016-02-20: 40 meq via ORAL
  Filled 2016-02-20: qty 2

## 2016-02-20 MED ORDER — ENOXAPARIN SODIUM 40 MG/0.4ML ~~LOC~~ SOLN
40.0000 mg | SUBCUTANEOUS | Status: DC
  Administered 2016-02-20 – 2016-02-21 (×2): 40 mg via SUBCUTANEOUS
  Filled 2016-02-20 (×2): qty 0.4

## 2016-02-20 MED ORDER — RISAQUAD PO CAPS
1.0000 | ORAL_CAPSULE | Freq: Two times a day (BID) | ORAL | Status: DC
  Administered 2016-02-20 – 2016-02-22 (×5): 1 via ORAL
  Filled 2016-02-20 (×5): qty 1

## 2016-02-20 NOTE — Consult Note (Signed)
[facility]ious [facility]     Reason for Consult:Recurrent C diff     Referring Physician: Claria Dice Date of Admission:  02/19/2016   Active Problems:   Colitis   HPI: Spencer Acosta is a 82 y.o. male with hx Crohns, A fib, CKD admitted from [place] with weakness and hypokalemia.  He as having diarrhea for 3 weeks and had + C diff test and started on oral flagyl as otpt but did not improve with one week .  He has been having bloody stools as well.  He was previously on Cipro a few weeks ago for UTI.  C diff test was + 2/20 at [facility]. On admission wbc was 12.4, afebrile. . Started on oral vancomycin  C diff antigen +, toxin EIA neg, PCR is +.  Stool comp PCR neg.  Hgb has been stable. Stool heme + CT scan with mild colonic wall thickening   Past Medical History  Diagnosis Date  . Hypertension   . Diabetes mellitus without complication ([place])   . Crohn's disease ([place])   . A-fib ([place])   . Hyperlipidemia   . PVD (peripheral vascular disease) ([place])   . CKD (chronic kidney disease)    Past Surgical History  Procedure Laterality Date  . Appendectomy    . Turp vaporization     Social History  Substance Use Topics  . Smoking status: Former Research scientist (life sciences)  . Smokeless tobacco: Never Used     Comment: quit 70 years ago  . Alcohol Use: 0.0 oz/week    0 Standard drinks or equivalent per week     Comment: occasional   History reviewed. No pertinent family history.  Allergies:  Allergies  Allergen Reactions  . Sulfa Antibiotics Nausea And Vomiting    Current antibiotics: Antibiotics Given (last 72 hours)    Date/Time Action Medication Dose   02/19/16 2309 Given   vancomycin (VANCOCIN) 50 mg/mL oral solution 125 mg 125 mg   02/20/16 0550 Given   vancomycin (VANCOCIN) 50 mg/mL oral solution 125 mg 125 mg      MEDICATIONS: . acidophilus  1 capsule Oral BID  . cholecalciferol  1,000 Units Oral Daily  . enoxaparin (LOVENOX) injection  30 mg Subcutaneous Q24H  .  Mesalamine  400 mg Oral QID  . metoprolol tartrate  25 mg Oral BID  . sodium chloride flush  3 mL Intravenous Q12H  . vancomycin  125 mg Oral 4 times per day    Review of Systems - 11 systems reviewed and negative per HPI   OBJECTIVE: Temp:  [97.4 F (36.3 C)-98.1 F (36.7 C)] 98 F (36.7 C) (03/22 1045) Pulse Rate:  [90-117] 117 (03/22 1045) Resp:  [16-28] 20 (03/22 1045) BP: (91-124)/(66-94) 115/66 mmHg (03/22 1045) SpO2:  [92 %-97 %] 93 % (03/22 0452) Weight:  [75.07 kg (165 lb 8 oz)] 75.07 kg (165 lb 8 oz) (03/21 2126) Physical Exam  Constitutional: He is oriented to person, place, and time. HOH  HENT: anicteric Mouth/Throat: Oropharynx is clear and moist. No oropharyngeal exudate.  Cardiovascular: Normal rate, regular rhythm and normal heart sounds. Exam reveals no gallop and no friction rub.  No murmur heard.  Pulmonary/Chest: Effort normal and breath sounds normal. No respiratory distress. He has no wheezes.  Abdominal: Soft. Bowel sounds are normal. He exhibits no distension. There is no tenderness.  Lymphadenopathy:  He has no cervical adenopathy.  Neurological: He is alert and oriented to person, place, and time.  Skin: Skin  is warm and dry. No rash noted. No erythema.  Psychiatric: He has a normal mood and affect. His behavior is normal.     LABS: Results for orders placed or performed during the hospital encounter of 02/19/16 (from the past 48 hour(s))  Comprehensive metabolic panel     Status: Abnormal   Collection Time: 02/19/16 11:17 AM  Result Value Ref Range   Sodium 139 135 - 145 mmol/L   Potassium 2.5 (LL) 3.5 - 5.1 mmol/L    Comment: CRITICAL RESULT CALLED TO, READ BACK BY AND VERIFIED WITH KIM CHERRY AT 1209 ON 02/19/16.[name] KitchenMarland KitchenCaribbean Medical Center    Chloride 104 101 - 111 mmol/L   CO2 26 22 - 32 mmol/L   Glucose, Bld 155 (H) 65 - 99 mg/dL   BUN 40 (H) 6 - 20 mg/dL   Creatinine, Ser 1.61 (H) 0.61 - 1.24 mg/dL   Calcium 7.7 (L) 8.9 - 10.3 mg/dL   Total Protein 6.0 (L)  6.5 - 8.1 g/dL   Albumin 3.1 (L) 3.5 - 5.0 g/dL   AST 18 15 - 41 U/L   ALT 12 (L) 17 - 63 U/L   Alkaline Phosphatase 55 38 - 126 U/L   Total Bilirubin 1.0 0.3 - 1.2 mg/dL   GFR calc non Af Amer 34 (L) >60 mL/min   GFR calc Af Amer 39 (L) >60 mL/min    Comment: (NOTE) The eGFR has been calculated using the CKD EPI equation. This calculation has not been validated in all clinical situations. eGFR's persistently <60 mL/min signify possible Chronic Kidney Disease.    Anion gap 9 5 - 15  Lipase, blood     Status: None   Collection Time: 02/19/16 11:17 AM  Result Value Ref Range   Lipase 16 11 - 51 U/L  CBC with Differential     Status: Abnormal   Collection Time: 02/19/16 11:17 AM  Result Value Ref Range   WBC 12.1 (H) 3.8 - 10.6 K/uL   RBC 3.89 (L) 4.40 - 5.90 MIL/uL   Hemoglobin 12.2 (L) 13.0 - 18.0 g/dL   HCT 36.4 (L) 40.0 - 52.0 %   MCV 93.5 80.0 - 100.0 fL   MCH 31.2 26.0 - 34.0 pg   MCHC 33.4 32.0 - 36.0 g/dL   RDW 14.7 (H) 11.5 - 14.5 %   Platelets 230 150 - 440 K/uL   Neutrophils Relative % 81 %   Neutro Abs 9.7 (H) 1.4 - 6.5 K/uL   Lymphocytes Relative 9 %   Lymphs Abs 1.1 1.0 - 3.6 K/uL   Monocytes Relative 10 %   Monocytes Absolute 1.2 (H) 0.2 - 1.0 K/uL   Eosinophils Relative 0 %   Eosinophils Absolute 0.0 0 - 0.7 K/uL   Basophils Relative 0 %   Basophils Absolute 0.0 0 - 0.1 K/uL  Protime-INR     Status: Abnormal   Collection Time: 02/19/16 11:17 AM  Result Value Ref Range   Prothrombin Time 17.1 (H) 11.4 - 15.0 seconds   INR 1.38   Type and screen [facility]     Status: None   Collection Time: 02/19/16 11:17 AM  Result Value Ref Range   ABO/RH(D) A POS    Antibody Screen NEG    Sample Expiration 02/22/2016   ABO/Rh     Status: None   Collection Time: 02/19/16 11:18 AM  Result Value Ref Range   ABO/RH(D) A POS   Hemoglobin     Status: Abnormal   Collection Time:  02/19/16  9:36 PM  Result Value Ref Range   Hemoglobin 11.5 (L)  13.0 - 18.0 g/dL  Gastrointestinal Panel by PCR , Stool     Status: None   Collection Time: 02/19/16 10:46 PM  Result Value Ref Range   Campylobacter species NOT DETECTED NOT DETECTED   Plesimonas shigelloides NOT DETECTED NOT DETECTED   Salmonella species NOT DETECTED NOT DETECTED   Yersinia enterocolitica NOT DETECTED NOT DETECTED   Vibrio species NOT DETECTED NOT DETECTED   Vibrio cholerae NOT DETECTED NOT DETECTED   Enteroaggregative E coli (EAEC) NOT DETECTED NOT DETECTED   Enteropathogenic E coli (EPEC) NOT DETECTED NOT DETECTED   Enterotoxigenic E coli (ETEC) NOT DETECTED NOT DETECTED   Shiga like toxin producing E coli (STEC) NOT DETECTED NOT DETECTED   E. coli O157 NOT DETECTED NOT DETECTED   Shigella/Enteroinvasive E coli (EIEC) NOT DETECTED NOT DETECTED   Cryptosporidium NOT DETECTED NOT DETECTED   Cyclospora cayetanensis NOT DETECTED NOT DETECTED   Entamoeba histolytica NOT DETECTED NOT DETECTED   Giardia lamblia NOT DETECTED NOT DETECTED   Adenovirus F40/41 NOT DETECTED NOT DETECTED   Astrovirus NOT DETECTED NOT DETECTED   Norovirus GI/GII NOT DETECTED NOT DETECTED   Rotavirus A NOT DETECTED NOT DETECTED   Sapovirus (I, II, IV, and V) NOT DETECTED NOT DETECTED  C difficile quick scan w PCR reflex     Status: Abnormal   Collection Time: 02/19/16 10:46 PM  Result Value Ref Range   C Diff antigen POSITIVE (A) NEGATIVE   C Diff toxin NEGATIVE NEGATIVE   C Diff interpretation      Positive for toxigenic C. difficile, active toxin production not detected. Patient has toxigenic C. difficile organisms present in the bowel, but toxin was not detected. The patient may be a carrier or the level of toxin in the sample was below the limit  of detection. This information should be used in conjunction with the patient's clinical history when deciding on possible therapy.     Comment: CRITICAL RESULT CALLED TO, READ BACK BY AND VERIFIED WITH: Olena Mater AT 3614 02/20/16 DV   MRSA  PCR Screening     Status: None   Collection Time: 02/19/16 10:46 PM  Result Value Ref Range   MRSA by PCR NEGATIVE NEGATIVE    Comment:        The GeneXpert MRSA Assay (FDA approved for NASAL specimens only), is one component of a comprehensive MRSA colonization surveillance program. It is not intended to diagnose MRSA infection nor to guide or monitor treatment for MRSA infections.   Clostridium Difficile by PCR     Status: Abnormal   Collection Time: 02/19/16 10:46 PM  Result Value Ref Range   Toxigenic C Difficile by pcr POSITIVE (A) NEGATIVE    Comment: CRITICAL RESULT CALLED TO, READ BACK BY AND VERIFIED WITH: [facility] FUENTES AT 4315 02/20/16 DV   Hemoglobin     Status: Abnormal   Collection Time: 02/20/16 12:20 AM  Result Value Ref Range   Hemoglobin 11.5 (L) 13.0 - 18.0 g/dL  Basic metabolic panel     Status: Abnormal   Collection Time: 02/20/16  4:46 AM  Result Value Ref Range   Sodium 141 135 - 145 mmol/L   Potassium 3.3 (L) 3.5 - 5.1 mmol/L   Chloride 111 101 - 111 mmol/L   CO2 25 22 - 32 mmol/L   Glucose, Bld 98 65 - 99 mg/dL   BUN 33 (H) 6 - 20 mg/dL  Creatinine, Ser 1.27 (H) 0.61 - 1.24 mg/dL   Calcium 7.5 (L) 8.9 - 10.3 mg/dL   GFR calc non Af Amer 45 (L) >60 mL/min   GFR calc Af Amer 52 (L) >60 mL/min    Comment: (NOTE) The eGFR has been calculated using the CKD EPI equation. This calculation has not been validated in all clinical situations. eGFR's persistently <60 mL/min signify possible Chronic Kidney Disease.    Anion gap 5 5 - 15  CBC     Status: Abnormal   Collection Time: 02/20/16  4:46 AM  Result Value Ref Range   WBC 12.4 (H) 3.8 - 10.6 K/uL   RBC 3.44 (L) 4.40 - 5.90 MIL/uL   Hemoglobin 11.0 (L) 13.0 - 18.0 g/dL   HCT 32.8 (L) 40.0 - 52.0 %   MCV 95.3 80.0 - 100.0 fL   MCH 31.9 26.0 - 34.0 pg   MCHC 33.5 32.0 - 36.0 g/dL   RDW 14.9 (H) 11.5 - 14.5 %   Platelets 194 150 - 440 K/uL  Magnesium     Status: None   Collection Time: 02/20/16   4:46 AM  Result Value Ref Range   Magnesium 1.9 1.7 - 2.4 mg/dL   No components found for: ESR, C REACTIVE PROTEIN MICRO: Recent Results (from the past 720 hour(s))  Gastrointestinal Panel by PCR , Stool     Status: None   Collection Time: 02/19/16 10:46 PM  Result Value Ref Range Status   Campylobacter species NOT DETECTED NOT DETECTED Final   Plesimonas shigelloides NOT DETECTED NOT DETECTED Final   Salmonella species NOT DETECTED NOT DETECTED Final   Yersinia enterocolitica NOT DETECTED NOT DETECTED Final   Vibrio species NOT DETECTED NOT DETECTED Final   Vibrio cholerae NOT DETECTED NOT DETECTED Final   Enteroaggregative E coli (EAEC) NOT DETECTED NOT DETECTED Final   Enteropathogenic E coli (EPEC) NOT DETECTED NOT DETECTED Final   Enterotoxigenic E coli (ETEC) NOT DETECTED NOT DETECTED Final   Shiga like toxin producing E coli (STEC) NOT DETECTED NOT DETECTED Final   E. coli O157 NOT DETECTED NOT DETECTED Final   Shigella/Enteroinvasive E coli (EIEC) NOT DETECTED NOT DETECTED Final   Cryptosporidium NOT DETECTED NOT DETECTED Final   Cyclospora cayetanensis NOT DETECTED NOT DETECTED Final   Entamoeba histolytica NOT DETECTED NOT DETECTED Final   Giardia lamblia NOT DETECTED NOT DETECTED Final   Adenovirus F40/41 NOT DETECTED NOT DETECTED Final   Astrovirus NOT DETECTED NOT DETECTED Final   Norovirus GI/GII NOT DETECTED NOT DETECTED Final   Rotavirus A NOT DETECTED NOT DETECTED Final   Sapovirus (I, II, IV, and V) NOT DETECTED NOT DETECTED Final  C difficile quick scan w PCR reflex     Status: Abnormal   Collection Time: 02/19/16 10:46 PM  Result Value Ref Range Status   C Diff antigen POSITIVE (A) NEGATIVE Final   C Diff toxin NEGATIVE NEGATIVE Final   C Diff interpretation   Final    Positive for toxigenic C. difficile, active toxin production not detected. Patient has toxigenic C. difficile organisms present in the bowel, but toxin was not detected. The patient may be a  carrier or the level of toxin in the sample was below the limit  of detection. This information should be used in conjunction with the patient's clinical history when deciding on possible therapy.     Comment: CRITICAL RESULT CALLED TO, READ BACK BY AND VERIFIED WITH: [facility] FUENTES AT 7282 02/20/16 DV  MRSA PCR Screening     Status: None   Collection Time: 02/19/16 10:46 PM  Result Value Ref Range Status   MRSA by PCR NEGATIVE NEGATIVE Final    Comment:        The GeneXpert MRSA Assay (FDA approved for NASAL specimens only), is one component of a comprehensive MRSA colonization surveillance program. It is not intended to diagnose MRSA infection nor to guide or monitor treatment for MRSA infections.   Clostridium Difficile by PCR     Status: Abnormal   Collection Time: 02/19/16 10:46 PM  Result Value Ref Range Status   Toxigenic C Difficile by pcr POSITIVE (A) NEGATIVE Final    Comment: CRITICAL RESULT CALLED TO, READ BACK BY AND VERIFIED WITH: [facility] FUENTES AT 2426 02/20/16 DV     IMAGING: Ct Abdomen Pelvis Wo Contrast  02/19/2016  CLINICAL DATA:  Diarrhea. Positive for Clostridium difficile. Generalized abdominal pain with mild distension. EXAM: CT ABDOMEN AND PELVIS WITHOUT CONTRAST TECHNIQUE: Multidetector CT imaging of the abdomen and pelvis was performed following the standard protocol without IV contrast. COMPARISON:  None. FINDINGS: Mild dependent atelectasis is noted in the lung bases. The heart is incompletely visualized but appears mildly enlarged. Coronary artery calcification is partially visualized. There is no pleural effusion. A 7 mm hypodensity in the posterior right hepatic lobe is too small to fully characterize. Multiple small layering stones are present in the gallbladder. There is no biliary dilatation. The spleen and adrenal glands are unremarkable. There is an approximately 1.5 cm fluid attenuation lesion in the proximal pancreatic body. No pancreatic ductal  dilatation is seen. Multiple hypoattenuating lesions are present in both kidneys measuring up to 2.2 cm on the right and 2.9 cm on the left, likely cysts but incompletely evaluated on this unenhanced study. A punctate calcification is noted adjacent to the largest lesion on the left. There is a 6 mm hyperattenuating lesion in the posterior upper pole of the left kidney which may represent a proteinaceous or hemorrhagic cyst though is also incompletely evaluated. There is no hydronephrosis. Oral contrast is present in nondilated loops of small and large bowel to the level of the hepatic flexure. There is no evidence of bowel obstruction. There is mild wall thickening involving the cecum and ascending colon. There may also be mild rectosigmoid wall thickening, though evaluation is partially limited by underdistention. Proximal sigmoid colon diverticulosis is noted without frank inflammatory change to clearly indicate acute diverticulitis. Prior appendectomy. There is no evidence of intraperitoneal free air. No free fluid or loculated fluid collection is identified. The bladder is unremarkable. The prostate is small. No enlarged lymph nodes are identified. There are small fat containing inguinal hernias bilaterally. Advanced atherosclerotic calcification is noted of the abdominal aorta and its major branch vessels. Thoracolumbar spondylosis is noted. IMPRESSION: 1. Mild colonic wall thickening, most notably proximally and compatible with history of C. difficile colitis. No evidence of bowel obstruction, perforation, or abscess. 2. Sigmoid colon diverticulosis. 3. Cholelithiasis. 4. Bilateral renal lesions as above, most likely cysts but incompletely evaluated. 5. 1.5 cm pancreatic body lesion without aggressive features. If follow-up/further characterization is clinically desired, then abdominal MRI (preferably without and with IV contrast) could be performed in 1 year. Electronically Signed   By: Logan Bores M.D.    On: 02/19/2016 16:44    Assessment: Spencer Acosta is a 82 y.o. male with hx Crohns, A fib, CKD admitted from [place] with weakness and hypokalemia.  He as having diarrhea for  3 weeks and had + C diff test and started on oral flagyl as otpt but did not improve with one week .  He has been having bloody stools as well.  He was previously on Cipro a few weeks ago for UTI.  C diff test was + 2/20 at [facility]. On admission wbc was 12.4, afebrile. . Started on oral vancomycin  C diff antigen +, toxin EIA neg, PCR is +.  Stool comp PCR neg.  Hgb has been stable. Stool heme + CT scan with mild colonic wall thickening  Recommendations COntinue oral vancomycin for a 14 day course Avoid other abx if possible He should be seen by Dr Tedra Coupe in GI as otpt if diarrhea persists as may not be related to  C diff but to flare of his Crohns colitis.  Thank you very much for allowing me to participate in the care of this patient. Please call with questions.   Cheral Marker. Ola Spurr, MD

## 2016-02-20 NOTE — Progress Notes (Signed)
Pharmacy note - anticoagulation  Patient with orders for enoxaparin 30mg  SQ Q24H for VTE prophylaxis.  Estimated Creatinine Clearance: 31.4 mL/min (by C-G formula based on Cr of 1.27).  Dose previously adjusted for CrCl < 19ml/min. Renal function improved, will resume original dose of enoxaparin 40mg  SQ Q24H per anticoagulation policy.  Rexene Edison, PharmD Clinical Pharmacist   02/20/2016 1:25 PM

## 2016-02-20 NOTE — Consult Note (Signed)
  Pt seen and examined. Please Rushie Chestnut' notes. Hx of Crohn's colitis in the past. Recalls being on medicine about 15 years ago. Does not remember the name of medicine. Now has persistent c.diff despite 3 weeks of flagyl. CT shows evidence of colitis. Unclear how much of the symptoms are related to c.diff or Crohn's dz. Recommend vancomycin 250mg  qid x 14 days plus asacol 800mg  tid x 1 month. Current dose of asacol inadequate. Will follow. Thanks.

## 2016-02-20 NOTE — Evaluation (Signed)
Physical Therapy Evaluation Patient Details Name: Spencer Acosta MRN: [id number] DOB: [birth year] Today's Date: 02/20/2016   History of Present Illness  Pt is a very pleasantly confused 82 y/o male admitted with weakness and hypokalemia. He was found to have colitis and recurrent bout of C.diff.   Clinical Impression  Patient requires minimal assistance to transfer from supine to sit, unclear if he was confused with technique or has true weakness. Otherwise he was able to perform at or above expectations of what his baseline is. He was able to ambulate with RW faster than any individual his age this Pryor Curia has seen, no balance deficits observed. He does not appear to need any PT past the acute setting and is safe to return to his previous living establishment.     Follow Up Recommendations No PT follow up    Equipment Recommendations       Recommendations for Other Services       Precautions / Restrictions Precautions Precautions: Fall Restrictions Weight Bearing Restrictions: No      Mobility  Bed Mobility Overal bed mobility: Needs Assistance Bed Mobility: Supine to Sit     Supine to sit: Min assist     General bed mobility comments: Patient required min A to bring trunk off the bed surface. Unclear if due to some confusion or true need.   Transfers Overall transfer level: Needs assistance Equipment used: Rolling walker (2 wheeled) Transfers: Sit to/from Stand Sit to Stand: Supervision         General transfer comment: No balance deficits noted with RW.   Ambulation/Gait Ambulation/Gait assistance: Modified independent (Device/Increase time) Ambulation Distance (Feet): 200 Feet Assistive device: Rolling walker (2 wheeled) Gait Pattern/deviations: WFL(Within Functional Limits)   Gait velocity interpretation: at or above normal speed for age/gender General Gait Details: Patient ambulates at higher speed than anyone his age this Pryor Curia has seen. No balance deficits  noted with RW.   Stairs            Wheelchair Mobility    Modified Rankin (Stroke Patients Only)       Balance Overall balance assessment: No apparent balance deficits (not formally assessed) (With RW)                                           Pertinent Vitals/Pain Pain Assessment: No/denies pain (Patient does not appear to be in pain during this session)    Home Living Family/patient expects to be discharged to:: Other (Comment) (Independent living facility. )                      Prior Function Level of Independence: Independent with assistive device(s)         Comments: Patient is a poor historian, but it appears he has been independent with a RW recently, was also using a cane of late.      Hand Dominance        Extremity/Trunk Assessment   Upper Extremity Assessment: Overall WFL for tasks assessed           Lower Extremity Assessment: Overall WFL for tasks assessed         Communication   Communication: HOH  Cognition Arousal/Alertness: Awake/alert Behavior During Therapy: WFL for tasks assessed/performed Overall Cognitive Status: History of cognitive impairments - at baseline (Pt is confused, but quite pleasant. Repeats questions multiple times, however  he seems generally oriented to self. )                      General Comments      Exercises        Assessment/Plan    PT Assessment Patient needs continued PT services  PT Diagnosis Generalized weakness   PT Problem List Decreased strength;Decreased mobility  PT Treatment Interventions DME instruction;Gait training;Therapeutic exercise;Therapeutic activities;Balance training   PT Goals (Current goals can be found in the Care Plan section) Acute Rehab PT Goals Patient Stated Goal: To return home  PT Goal Formulation: With patient Time For Goal Achievement: 03/05/16 Potential to Achieve Goals: Good    Frequency Min 2X/week   Barriers to  discharge        Co-evaluation               End of Session Equipment Utilized During Treatment: Gait belt Activity Tolerance: Patient tolerated treatment well Patient left: in chair;with chair alarm set;with call bell/phone within reach Nurse Communication: Mobility status         Time: [id number] PT Time Calculation (min) (ACUTE ONLY): 15 min   Charges:   PT Evaluation $PT Eval Moderate Complexity: 1 Procedure     PT G Codes:       Kerman Passey, PT, DPT    02/20/2016, 2:24 PM

## 2016-02-20 NOTE — Plan of Care (Signed)
Problem: Education: Goal: Knowledge of [facility] Education information/materials will improve Outcome: Progressing Pt likes to be called Mr Puente    Past Medical History   Diagnosis  Date   .  Hypertension     .  Diabetes mellitus without complication ([place])     .  Crohn's disease ([place])     .  A-fib ([place])     .  Hyperlipidemia     .  PVD (peripheral vascular disease) ([place])     .  CKD (chronic kidney disease)            Pt is well controlled with home medications.

## 2016-02-20 NOTE — Plan of Care (Signed)
Problem: Activity: Goal: Risk for activity intolerance will decrease Outcome: Progressing P.t. Saw chair tol well  Problem: Bowel/Gastric: Goal: Will not experience complications related to bowel motility Outcome: Progressing 1 large  Loose stool this pm. Dr fitzgerald saw  Today/ cont on vanc po.

## 2016-02-20 NOTE — Consult Note (Signed)
GI Inpatient Consult Note  Reason for Consult: Colitis/Rectal Bleeding    Attending Requesting Consult: Dr. Tressia Miners   History of Present Illness: Spencer Acosta is a 82 y.o. male seen for evaluation of colitis and rectal bleeding at the request of Dr. Tressia Miners. PMhx of HTN, DM, Crohn's dx, atrial fib, CKD. He was diagnosed w/ C. Diff 3 weeks ago. He has been on Flagyl for 3 weeks. On admission found to have CKD w/ Cr 1.6, K+ 2.5, WBC 12.1, Hgb 12.2.  He is a fair historian but unable to recall specific details. He reports some chronic intermittent diarrhea including nocturnal stooling. Several weeks ago developed worsening diarrhea with multiple episodes of nocturnal stooling and BM during day approximately every 1 hour. He started on Flagyl and seems like stool frequncy improved somewhat. After a few days developed worsening diarrhea again. Mild lower abd pain that resolves w/ defecation. He reports blood in stools. He denies any nausea. Eating slightly less due to post prandial urgency.   He feels his weight is stable. Denies GERD, dysphagia, n/v. States he ate a big breakfast and lunch today. Unsure if taking Asacol 400mg  QID on med list.     Last Colonoscopy: approximately 15 years ago-unsure of findings.     Past Medical History:  Past Medical History  Diagnosis Date  . Hypertension   . Diabetes mellitus without complication ([place])   . Crohn's disease ([place])   . A-fib ([place])   . Hyperlipidemia   . PVD (peripheral vascular disease) ([place])   . CKD (chronic kidney disease)     Problem List: Patient Active Problem List   Diagnosis Date Noted  . Colitis 02/19/2016    Past Surgical History: Past Surgical History  Procedure Laterality Date  . Appendectomy    . Turp vaporization      Allergies: Allergies  Allergen Reactions  . Sulfa Antibiotics Nausea And Vomiting    Home Medications: Prescriptions prior to admission  Medication Sig Dispense Refill Last Dose  .  cholecalciferol (VITAMIN D) 1000 units tablet Take 1,000 Units by mouth daily.   unknown at unknown   . hydrochlorothiazide (MICROZIDE) 12.5 MG capsule Take 12.5 mg by mouth daily.   unknown at unknown    . latanoprost (XALATAN) 0.005 % ophthalmic solution Place 1 drop into both eyes at bedtime.   unknown at unknown    Home medication reconciliation was completed with the patient.   Scheduled Inpatient Medications:   . acidophilus  1 capsule Oral BID  . cholecalciferol  1,000 Units Oral Daily  . enoxaparin (LOVENOX) injection  40 mg Subcutaneous Q24H  . Mesalamine  400 mg Oral QID  . metoprolol tartrate  25 mg Oral BID  . sodium chloride flush  3 mL Intravenous Q12H  . vancomycin  125 mg Oral 4 times per day    Continuous Inpatient Infusions:   . 0.9 % NaCl with KCl 20 mEq / L 75 mL/hr at 02/19/16 2344    PRN Inpatient Medications:  acetaminophen **OR** acetaminophen, ondansetron **OR** ondansetron (ZOFRAN) IV  Family History: family history is not on file.  Pt denies any family history of colon polyps, CRC, IBD.   Social History:   reports that he has quit smoking. He has never used smokeless tobacco. He reports that he drinks alcohol. He reports that he does not use illicit drugs.     Review of Systems: Constitutional: Weight is stable.  Eyes: No changes in vision. ENT: No oral lesions, sore throat.  GI: see HPI.  Heme/Lymph: No easy bruising.  CV: No chest pain.  GU: No hematuria.  Integumentary: No rashes.  Neuro: No headaches.  Psych: No depression/anxiety.  Endocrine: No heat/cold intolerance.  Allergic/Immunologic: No urticaria.  Resp: No cough, SOB.  Musculoskeletal: No joint swelling.    Physical Examination: BP 115/66 mmHg  Pulse 117  Temp(Src) 98 F (36.7 C) (Oral)  Resp 20  Ht 5\' 8"  (1.727 m)  Wt 165 lb 8 oz (75.07 kg)  BMI 25.17 kg/m2  SpO2 93%  Gen: NAD, alert and oriented x 4. Resting in chair. Extremely hard of hearing.  HEENT: PEERLA,  EOMI, Neck: supple, no JVD or thyromegaly Chest: CTA bilaterally, no wheezes, crackles, or other adventitious sounds CV: irregular rhythm, no m/g/c/r Abd: soft, NT, ND, +BS in all four quadrants; no HSM, guarding, ridigity, or rebound tenderness Rectal--brown stool w/ slight red streaking observed.  Ext: stands w/ assistance. SCDs in place.  Skin: no rash or lesions noted Lymph: no LAD  Data: Lab Results  Component Value Date   WBC 12.4* 02/20/2016   HGB 11.0* 02/20/2016   HCT 32.8* 02/20/2016   MCV 95.3 02/20/2016   PLT 194 02/20/2016    Recent Labs Lab 02/19/16 2136 02/20/16 0020 02/20/16 0446  HGB 11.5* 11.5* 11.0*   Lab Results  Component Value Date   NA 141 02/20/2016   K 3.3* 02/20/2016   CL 111 02/20/2016   CO2 25 02/20/2016   BUN 33* 02/20/2016   CREATININE 1.27* 02/20/2016   Lab Results  Component Value Date   ALT 12* 02/19/2016   AST 18 02/19/2016   ALKPHOS 55 02/19/2016   BILITOT 1.0 02/19/2016    Recent Labs Lab 02/19/16 1117  INR 1.38    CT a/p IMPRESSION: 1. Mild colonic wall thickening, most notably proximally and compatible with history of C. difficile colitis. No evidence of bowel obstruction, perforation, or abscess. 2. Sigmoid colon diverticulosis. 3. Cholelithiasis. 4. Bilateral renal lesions as above, most likely cysts but incompletely evaluated. 5. 1.5 cm pancreatic body lesion without aggressive features. If follow-up/further characterization is clinically desired, then abdominal MRI (preferably without and with IV contrast) could be performed in 1 year.  Assessment/Plan: Spencer Acosta is a 82 y.o. male admitted for diarrhea. PMHx of Crohn's disease and C. Diff positive.   1. Diarrhea - unclear if more caused by C. Diff or by Crohn's disease. Improved slightly with flagyl before worsening again. Will switch from Flagyl to Vancomycin 250mg  QID. Will also increase his mesalamine to appropriate dosing of Asacol 800mg  TID. Given his  advanced age and co-morbidities endoscopic evaluation not planned.    Recommendations:  1. Increase vancomycin from 125mg  to 250mg  QID.  2. Increase asacol to 800mg  TID.  3. Supportive care.    *Case discussed with Dr. Candace Cruise   Thank you for the consult. Please call with questions or concerns.  Ronney Asters, PA-C [place]

## 2016-02-20 NOTE — Progress Notes (Signed)
[facility] [place] [facility] NAME: Spencer Acosta    MR#:  [id number]  DATE OF BIRTH:  22-Oct-1915  SUBJECTIVE:  CHIEF COMPLAINT:   Chief Complaint  Patient presents with  . Rectal Bleeding   - Pleasantly confused, talkative. Still having significant diarrhea. C.diff is positive - able to tolerate diet, no abdominal pain, nausea or vomiting  REVIEW OF SYSTEMS:  Review of Systems  Constitutional: Negative for fever and chills.  HENT: Positive for hearing loss. Negative for ear discharge and nosebleeds.   Respiratory: Negative for cough, shortness of breath and wheezing.   Cardiovascular: Negative for chest pain, palpitations and leg swelling.  Gastrointestinal: Positive for diarrhea. Negative for nausea, vomiting, abdominal pain and constipation.  Genitourinary: Negative for dysuria and urgency.  Musculoskeletal: Negative for myalgias.  Neurological: Positive for weakness. Negative for dizziness, sensory change, speech change, seizures and headaches.  Psychiatric/Behavioral: Negative for depression.    DRUG ALLERGIES:   Allergies  Allergen Reactions  . Sulfa Antibiotics Nausea And Vomiting    VITALS:  Blood pressure 102/66, pulse 105, temperature 97.4 F (36.3 C), temperature source Oral, resp. rate 18, height 5\' 8"  (1.727 m), weight 75.07 kg (165 lb 8 oz), SpO2 93 %.  PHYSICAL EXAMINATION:  Physical Exam  GENERAL: 82 y.o.-year-old elderly patient lying in the bed with no acute distress.  EYES: Pupils equal, round, reactive to light and accommodation. No scleral icterus. Extraocular muscles intact.  HEENT: Head atraumatic, normocephalic. Oropharynx and nasopharynx clear.  NECK: Supple, no jugular venous distention. No thyroid enlargement, no tenderness.  LUNGS: Normal breath sounds bilaterally, no wheezing, rales,rhonchi or crepitation. No use of accessory muscles of respiration.  CARDIOVASCULAR: S1, S2 normal. Rapid and  irregular. No rubs, or gallops. 3/6 systolic murmur present. ABDOMEN: Soft, nontender, nondistended. Bowel sounds present. No organomegaly or mass.  Guaiac is positive EXTREMITIES: No pedal edema, cyanosis, or clubbing.  NEUROLOGIC: Cranial nerves II through XII are intact. Muscle strength 5/5 in all extremities. Sensation intact. Gait not checked.  PSYCHIATRIC: The patient is alert and oriented x 3.  SKIN: No obvious rash, lesion, or ulcer.     LABORATORY PANEL:   CBC  Recent Labs Lab 02/20/16 0446  WBC 12.4*  HGB 11.0*  HCT 32.8*  PLT 194   ------------------------------------------------------------------------------------------------------------------  Chemistries   Recent Labs Lab 02/19/16 1117 02/20/16 0446  NA 139 141  K 2.5* 3.3*  CL 104 111  CO2 26 25  GLUCOSE 155* 98  BUN 40* 33*  CREATININE 1.61* 1.27*  CALCIUM 7.7* 7.5*  AST 18  --   ALT 12*  --   ALKPHOS 55  --   BILITOT 1.0  --    ------------------------------------------------------------------------------------------------------------------  Cardiac Enzymes No results for input(s): TROPONINI in the last 168 hours. ------------------------------------------------------------------------------------------------------------------  RADIOLOGY:  Ct Abdomen Pelvis Wo Contrast  02/19/2016  CLINICAL DATA:  Diarrhea. Positive for Clostridium difficile. Generalized abdominal pain with mild distension. EXAM: CT ABDOMEN AND PELVIS WITHOUT CONTRAST TECHNIQUE: Multidetector CT imaging of the abdomen and pelvis was performed following the standard protocol without IV contrast. COMPARISON:  None. FINDINGS: Mild dependent atelectasis is noted in the lung bases. The heart is incompletely visualized but appears mildly enlarged. Coronary artery calcification is partially visualized. There is no pleural effusion. A 7 mm hypodensity in the posterior right hepatic lobe is too small to fully characterize. Multiple small  layering stones are present in the gallbladder. There is no biliary dilatation. The spleen and adrenal  glands are unremarkable. There is an approximately 1.5 cm fluid attenuation lesion in the proximal pancreatic body. No pancreatic ductal dilatation is seen. Multiple hypoattenuating lesions are present in both kidneys measuring up to 2.2 cm on the right and 2.9 cm on the left, likely cysts but incompletely evaluated on this unenhanced study. A punctate calcification is noted adjacent to the largest lesion on the left. There is a 6 mm hyperattenuating lesion in the posterior upper pole of the left kidney which may represent a proteinaceous or hemorrhagic cyst though is also incompletely evaluated. There is no hydronephrosis. Oral contrast is present in nondilated loops of small and large bowel to the level of the hepatic flexure. There is no evidence of bowel obstruction. There is mild wall thickening involving the cecum and ascending colon. There may also be mild rectosigmoid wall thickening, though evaluation is partially limited by underdistention. Proximal sigmoid colon diverticulosis is noted without frank inflammatory change to clearly indicate acute diverticulitis. Prior appendectomy. There is no evidence of intraperitoneal free air. No free fluid or loculated fluid collection is identified. The bladder is unremarkable. The prostate is small. No enlarged lymph nodes are identified. There are small fat containing inguinal hernias bilaterally. Advanced atherosclerotic calcification is noted of the abdominal aorta and its major branch vessels. Thoracolumbar spondylosis is noted. IMPRESSION: 1. Mild colonic wall thickening, most notably proximally and compatible with history of C. difficile colitis. No evidence of bowel obstruction, perforation, or abscess. 2. Sigmoid colon diverticulosis. 3. Cholelithiasis. 4. Bilateral renal lesions as above, most likely cysts but incompletely evaluated. 5. 1.5 cm pancreatic  body lesion without aggressive features. If follow-up/further characterization is clinically desired, then abdominal MRI (preferably without and with IV contrast) could be performed in 1 year. Electronically Signed   By: Logan Bores M.D.   On: 02/19/2016 16:44    EKG:   Orders placed or performed during the hospital encounter of 02/19/16  . ED EKG  . ED EKG    ASSESSMENT AND PLAN:   Chelsey Alix is a 82 y.o. male with a known history of hypertension, history of Crohn's colitis, atrial fibrillation not on anticoagulation due to his age, CKD presents from [facility] [place] [facility] secondary to weakness and hypokalemia.  #1 C. difficile colitis with diarrhea with melena-started with C. difficile diarrhea about 3 weeks ago. Has been on Flagyl for 3 weeks now. -Repeat stool studies again positive for C. difficile. Started on oral vancomycin and probiotics -GI and ID consults requested -Also has underlying Crohn's colitis. Bloody diarrhea could be related to that. Hemoglobin is stable. - CT of the abdomen showing colitis, no evidence of obstruction, perforation or abscess. Sigmoid colon diverticulosis noted.. Continue mesalamine for now -No abdominal pain or nausea and vomiting. Advance diet as tolerated -IV fluids for now.  #2 hypokalemia- due to diarrhea and also on HCTZ - replace potassium and recheck.  #3 atrial fibrillation- rate is elevated- not on any meds at home - HR elevated from dehydration, hypoaklemia.  - on oral metoprolol and also replace potassium -not on anticoagulation due to age -Several PVCs on the monitor.  check magnesium level   #4 CKD- baseline cr of 1.1, cr on admission was 1.6- pre renal from poor intake -Improving with IV fluids Monitor, avoid nephrotoxins  #5 hypertension- hold hctz due to hypokalemia On metoprolol now  #6 DVT prophylaxis-on Lovenox   No family locally. Patient is from [facility] [place] [facility]. 2 daughters in [place].  He is a  DO NOT RESUSCITATE based on his paperwork. Physical therapy consulted     All the records are reviewed and case discussed with Care Management/Social Workerr. Management plans discussed with the patient, family and they are in agreement.  CODE STATUS: Full Code  TOTAL TIME TAKING CARE OF THIS PATIENT: 36  minutes.   POSSIBLE D/C IN 2 DAYS, DEPENDING ON CLINICAL CONDITION.   Gladstone Lighter M.D on 02/20/2016 at 9:44 AM  Between 7am to 6pm - Pager - [phone]  After 6pm go to [email or web address] - password [email or web address] [facility]  Office  [phone]  CC: Primary care physician; Kirk Ruths., MD

## 2016-02-21 LAB — BASIC METABOLIC PANEL
ANION GAP: 2 — AB (ref 5–15)
BUN: 25 mg/dL — ABNORMAL HIGH (ref 6–20)
CALCIUM: 7.3 mg/dL — AB (ref 8.9–10.3)
CHLORIDE: 112 mmol/L — AB (ref 101–111)
CO2: 26 mmol/L (ref 22–32)
Creatinine, Ser: 1.15 mg/dL (ref 0.61–1.24)
GFR calc non Af Amer: 51 mL/min — ABNORMAL LOW (ref >60)
GFR, EST AFRICAN AMERICAN: 59 mL/min — AB (ref >60)
Glucose, Bld: 128 mg/dL — ABNORMAL HIGH (ref 65–99)
POTASSIUM: 4 mmol/L (ref 3.5–5.1)
Sodium: 140 mmol/L (ref 135–145)

## 2016-02-21 LAB — MAGNESIUM: Magnesium: 1.8 mg/dL (ref 1.7–2.4)

## 2016-02-21 MED ORDER — MAGNESIUM SULFATE 2 GM/50ML IV SOLN
2.0000 g | Freq: Once | INTRAVENOUS | Status: AC
  Administered 2016-02-21: 2 g via INTRAVENOUS
  Filled 2016-02-21: qty 50

## 2016-02-21 NOTE — Progress Notes (Signed)
Patient ID: Spencer Acosta, male   DOB: 1915/05/11, 82 y.o.   MRN: [id number] [facility] PROGRESS NOTE  JAQUAVIS SMEBY [id number] DOB: 01/15/1915 DOA: 02/19/2016 PCP: Kirk Ruths., MD  HPI/Subjective: Patient seen this morning. He said he had an episode of diarrhea in the morning. An episode last night. He is feeling a little bit better since coming into the hospital.  Objective: Filed Vitals:   02/21/16 1000 02/21/16 1351  BP: 111/64 101/57  Pulse: 98 87  Temp: 97.5 F (36.4 C) 97.9 F (36.6 C)  Resp: 18 20    Filed Weights   02/19/16 1108 02/19/16 2126  Weight: 77.111 kg (170 lb) 75.07 kg (165 lb 8 oz)    ROS: Review of Systems  Constitutional: Negative for fever and chills.  Eyes: Negative for blurred vision.  Respiratory: Negative for cough and shortness of breath.   Cardiovascular: Negative for chest pain.  Gastrointestinal: Positive for diarrhea. Negative for nausea, vomiting, abdominal pain and constipation.  Genitourinary: Negative for dysuria.  Musculoskeletal: Negative for joint pain.  Neurological: Negative for dizziness and headaches.   Exam: Physical Exam  Constitutional: He is oriented to person, place, and time.  HENT:  Nose: No mucosal edema.  Mouth/Throat: No oropharyngeal exudate or posterior oropharyngeal edema.  Eyes: Conjunctivae, EOM and lids are normal. Pupils are equal, round, and reactive to light.  Neck: No JVD present. Carotid bruit is not present. No edema present. No thyroid mass and no thyromegaly present.  Cardiovascular: S1 normal and S2 normal.  Exam reveals no gallop.   Murmur heard.  Systolic murmur is present with a grade of 2/6  Pulses:      Dorsalis pedis pulses are 2+ on the right side, and 2+ on the left side.  Respiratory: No respiratory distress. He has no wheezes. He has no rhonchi. He has no rales.  GI: Soft. Bowel sounds are normal. There is no tenderness.  Musculoskeletal:       Right ankle: He  exhibits swelling.       Left ankle: He exhibits swelling.  Lymphadenopathy:    He has no cervical adenopathy.  Neurological: He is alert and oriented to person, place, and time. No cranial nerve deficit.  Skin: Skin is warm. No rash noted. Nails show no clubbing.  Psychiatric: He has a normal mood and affect.      Data Reviewed: Basic Metabolic Panel:  Recent Labs Lab 02/19/16 1117 02/20/16 0446 02/21/16 0442  NA 139 141 140  K 2.5* 3.3* 4.0  CL 104 111 112*  CO2 26 25 26   GLUCOSE 155* 98 128*  BUN 40* 33* 25*  CREATININE 1.61* 1.27* 1.15  CALCIUM 7.7* 7.5* 7.3*  MG  --  1.9 1.8   Liver Function Tests:  Recent Labs Lab 02/19/16 1117  AST 18  ALT 12*  ALKPHOS 55  BILITOT 1.0  PROT 6.0*  ALBUMIN 3.1*    Recent Labs Lab 02/19/16 1117  LIPASE 16   CBC:  Recent Labs Lab 02/19/16 1117 02/19/16 2136 02/20/16 0020 02/20/16 0446 02/20/16 1543  WBC 12.1*  --   --  12.4*  --   NEUTROABS 9.7*  --   --   --   --   HGB 12.2* 11.5* 11.5* 11.0* 12.1*  HCT 36.4*  --   --  32.8*  --   MCV 93.5  --   --  95.3  --   PLT 230  --   --  194  --  Recent Results (from the past 240 hour(s))  Gastrointestinal Panel by PCR , Stool     Status: None   Collection Time: 02/19/16 10:46 PM  Result Value Ref Range Status   Campylobacter species NOT DETECTED NOT DETECTED Final   Plesimonas shigelloides NOT DETECTED NOT DETECTED Final   Salmonella species NOT DETECTED NOT DETECTED Final   Yersinia enterocolitica NOT DETECTED NOT DETECTED Final   Vibrio species NOT DETECTED NOT DETECTED Final   Vibrio cholerae NOT DETECTED NOT DETECTED Final   Enteroaggregative E coli (EAEC) NOT DETECTED NOT DETECTED Final   Enteropathogenic E coli (EPEC) NOT DETECTED NOT DETECTED Final   Enterotoxigenic E coli (ETEC) NOT DETECTED NOT DETECTED Final   Shiga like toxin producing E coli (STEC) NOT DETECTED NOT DETECTED Final   E. coli O157 NOT DETECTED NOT DETECTED Final    Shigella/Enteroinvasive E coli (EIEC) NOT DETECTED NOT DETECTED Final   Cryptosporidium NOT DETECTED NOT DETECTED Final   Cyclospora cayetanensis NOT DETECTED NOT DETECTED Final   Entamoeba histolytica NOT DETECTED NOT DETECTED Final   Giardia lamblia NOT DETECTED NOT DETECTED Final   Adenovirus F40/41 NOT DETECTED NOT DETECTED Final   Astrovirus NOT DETECTED NOT DETECTED Final   Norovirus GI/GII NOT DETECTED NOT DETECTED Final   Rotavirus A NOT DETECTED NOT DETECTED Final   Sapovirus (I, II, IV, and V) NOT DETECTED NOT DETECTED Final  C difficile quick scan w PCR reflex     Status: Abnormal   Collection Time: 02/19/16 10:46 PM  Result Value Ref Range Status   C Diff antigen POSITIVE (A) NEGATIVE Final   C Diff toxin NEGATIVE NEGATIVE Final   C Diff interpretation   Final    Positive for toxigenic C. difficile, active toxin production not detected. Patient has toxigenic C. difficile organisms present in the bowel, but toxin was not detected. The patient may be a carrier or the level of toxin in the sample was below the limit  of detection. This information should be used in conjunction with the patient's clinical history when deciding on possible therapy.     Comment: CRITICAL RESULT CALLED TO, READ BACK BY AND VERIFIED WITH: Olena Mater AT [id number] 02/20/16 DV   MRSA PCR Screening     Status: None   Collection Time: 02/19/16 10:46 PM  Result Value Ref Range Status   MRSA by PCR NEGATIVE NEGATIVE Final    Comment:        The GeneXpert MRSA Assay (FDA approved for NASAL specimens only), is one component of a comprehensive MRSA colonization surveillance program. It is not intended to diagnose MRSA infection nor to guide or monitor treatment for MRSA infections.   Clostridium Difficile by PCR     Status: Abnormal   Collection Time: 02/19/16 10:46 PM  Result Value Ref Range Status   Toxigenic C Difficile by pcr POSITIVE (A) NEGATIVE Final    Comment: CRITICAL RESULT CALLED TO, READ  BACK BY AND VERIFIED WITH: [facility] FUENTES AT [id number] 02/20/16 DV      Studies: Ct Abdomen Pelvis Wo Contrast  02/19/2016  CLINICAL DATA:  Diarrhea. Positive for Clostridium difficile. Generalized abdominal pain with mild distension. EXAM: CT ABDOMEN AND PELVIS WITHOUT CONTRAST TECHNIQUE: Multidetector CT imaging of the abdomen and pelvis was performed following the standard protocol without IV contrast. COMPARISON:  None. FINDINGS: Mild dependent atelectasis is noted in the lung bases. The heart is incompletely visualized but appears mildly enlarged. Coronary artery calcification is partially visualized. There is no pleural  effusion. A 7 mm hypodensity in the posterior right hepatic lobe is too small to fully characterize. Multiple small layering stones are present in the gallbladder. There is no biliary dilatation. The spleen and adrenal glands are unremarkable. There is an approximately 1.5 cm fluid attenuation lesion in the proximal pancreatic body. No pancreatic ductal dilatation is seen. Multiple hypoattenuating lesions are present in both kidneys measuring up to 2.2 cm on the right and 2.9 cm on the left, likely cysts but incompletely evaluated on this unenhanced study. A punctate calcification is noted adjacent to the largest lesion on the left. There is a 6 mm hyperattenuating lesion in the posterior upper pole of the left kidney which may represent a proteinaceous or hemorrhagic cyst though is also incompletely evaluated. There is no hydronephrosis. Oral contrast is present in nondilated loops of small and large bowel to the level of the hepatic flexure. There is no evidence of bowel obstruction. There is mild wall thickening involving the cecum and ascending colon. There may also be mild rectosigmoid wall thickening, though evaluation is partially limited by underdistention. Proximal sigmoid colon diverticulosis is noted without frank inflammatory change to clearly indicate acute diverticulitis. Prior  appendectomy. There is no evidence of intraperitoneal free air. No free fluid or loculated fluid collection is identified. The bladder is unremarkable. The prostate is small. No enlarged lymph nodes are identified. There are small fat containing inguinal hernias bilaterally. Advanced atherosclerotic calcification is noted of the abdominal aorta and its major branch vessels. Thoracolumbar spondylosis is noted. IMPRESSION: 1. Mild colonic wall thickening, most notably proximally and compatible with history of C. difficile colitis. No evidence of bowel obstruction, perforation, or abscess. 2. Sigmoid colon diverticulosis. 3. Cholelithiasis. 4. Bilateral renal lesions as above, most likely cysts but incompletely evaluated. 5. 1.5 cm pancreatic body lesion without aggressive features. If follow-up/further characterization is clinically desired, then abdominal MRI (preferably without and with IV contrast) could be performed in 1 year. Electronically Signed   By: Logan Bores M.D.   On: 02/19/2016 16:44    Scheduled Meds: . acidophilus  1 capsule Oral BID  . cholecalciferol  1,000 Units Oral Daily  . enoxaparin (LOVENOX) injection  40 mg Subcutaneous Q24H  . magnesium sulfate 1 - 4 g bolus IVPB  2 g Intravenous Once  . Mesalamine  800 mg Oral TID  . metoprolol tartrate  25 mg Oral BID  . sodium chloride flush  3 mL Intravenous Q12H  . vancomycin  250 mg Oral 4 times per day   Continuous Infusions: . 0.9 % NaCl with KCl 20 mEq / L 30 mL/hr at 02/21/16 1422    Assessment/Plan:  1. Clostridium difficile colitis. Continue oral vancomycin.  I spoke with Education officer, museum and Transport planner to make sure that he can get his oral vancomycin at [facility]. Patient needs a 3 night stay at least in the hospital prior to going back to facility. 2. Hypokalemia and hypomagnesemia replace magnesium IV. Potassium replaced and IV fluids. Rate decreased on the fluids 3. Hypertension and tachycardia. History of atrial  fibrillation. Continue metoprolol 4. Crohn's disease continue mesalamine 5. Chronic kidney disease stage III  Code Status:     Potentially back to facilityCode Status Orders        Start     Ordered   02/19/16 2129  Do not attempt resuscitation (DNR)   Continuous    Question Answer Comment  In the e25 minutesvent o exhibits putting it where I wantf cardiac or  respiratory ARREST Do not call a "code blue"   In the event of cardiac or respiratory ARREST Do not perform Intubation, CPR, defibrillation or ACLS   In the event of cardiac or respiratory ARREST Use medication by any route, position, wound care, and other measures to relive pain and suffering. May use oxygen, suction and manual treatment of airway obstruction as needed for comfort.      02/19/16 2128    Code Status History    Date Active Date Inactive Code Status Order ID Comments User Context   This patient has a current code status but no historical code status.    Advance Directive Documentation        Most Recent Value   Type of Advance Directive  Healthcare Power of [place], Living will [Shan Zacharee Illes (daughter)]   Pre-existing out of facility DNR order (yellow form or pink MOST form)     "MOST" Form in Place?       Disposition Plan: Back to [facility] tomorrow skilled   Antibiotics:  Oral vancomycin  Time spent: 25 minutes  Loletha Grayer  [facility]

## 2016-02-21 NOTE — Consult Note (Signed)
  GI Inpatient Follow-up Note  Patient Identification: Spencer Acosta is a 82 y.o. male  With hx of Crohn's and positive c.diff Ag though neg for toxin. Subjective: Had 4 soft/loose BM's yesterday. 2 BM's so far today. Some abdominal cramping before each BM. Scheduled Inpatient Medications:  . acidophilus  1 capsule Oral BID  . cholecalciferol  1,000 Units Oral Daily  . enoxaparin (LOVENOX) injection  40 mg Subcutaneous Q24H  . Mesalamine  800 mg Oral TID  . metoprolol tartrate  25 mg Oral BID  . sodium chloride flush  3 mL Intravenous Q12H  . vancomycin  250 mg Oral 4 times per day    Continuous Inpatient Infusions:   . 0.9 % NaCl with KCl 20 mEq / L 30 mL/hr at 02/21/16 0837    PRN Inpatient Medications:  acetaminophen **OR** acetaminophen, ondansetron **OR** ondansetron (ZOFRAN) IV  Review of Systems: Constitutional: Weight is stable.  Eyes: No changes in vision. ENT: No oral lesions, sore throat.  GI: see HPI.  Heme/Lymph: No easy bruising.  CV: No chest pain.  GU: No hematuria.  Integumentary: No rashes.  Neuro: No headaches.  Psych: No depression/anxiety.  Endocrine: No heat/cold intolerance.  Allergic/Immunologic: No urticaria.  Resp: No cough, SOB.  Musculoskeletal: No joint swelling.    Physical Examination: BP 111/64 mmHg  Pulse 98  Temp(Src) 97.5 F (36.4 C) (Oral)  Resp 18  Ht 5\' 8"  (1.727 m)  Wt 75.07 kg (165 lb 8 oz)  BMI 25.17 kg/m2  SpO2 100% Gen: NAD, alert and oriented x 4 HEENT: PEERLA, EOMI, Neck: supple, no JVD or thyromegaly Chest: CTA bilaterally, no wheezes, crackles, or other adventitious sounds CV: RRR, no m/g/c/r Abd: soft, NT, ND, +BS in all four quadrants; no HSM, guarding, ridigity, or rebound tenderness Ext: no edema, well perfused with 2+ pulses, Skin: no rash or lesions noted Lymph: no LAD  Data: Lab Results  Component Value Date   WBC 12.4* 02/20/2016   HGB 12.1* 02/20/2016   HCT 32.8* 02/20/2016   MCV 95.3 02/20/2016    PLT 194 02/20/2016    Recent Labs Lab 02/20/16 0020 02/20/16 0446 02/20/16 1543  HGB 11.5* 11.0* 12.1*   Lab Results  Component Value Date   NA 140 02/21/2016   K 4.0 02/21/2016   CL 112* 02/21/2016   CO2 26 02/21/2016   BUN 25* 02/21/2016   CREATININE 1.15 02/21/2016   Lab Results  Component Value Date   ALT 12* 02/19/2016   AST 18 02/19/2016   ALKPHOS 55 02/19/2016   BILITOT 1.0 02/19/2016    Recent Labs Lab 02/19/16 1117  INR 1.38   Assessment/Plan: Spencer Acosta is a 81 y.o. male with persistent diarrhea.  Recommendations: Continue vancomycin and asacol for now. Thanks. Please call with questions or concerns.  Demerius Podolak, Lupita Dawn, MD

## 2016-02-21 NOTE — Clinical Social Work Placement (Signed)
   CLINICAL SOCIAL WORK PLACEMENT  NOTE  Date:  02/21/2016  Patient Details  Name: Spencer Acosta MRN: [id number] Date of Birth: 1915-08-08  Clinical Social Work is seeking post-discharge placement for this patient at the [place] level of care (*CSW will initial, date and re-position this form in  chart as items are completed):  Yes   Patient/family provided with [place] Work Department's list of facilities offering this level of care within the geographic area requested by the patient (or if unable, by the patient's family).  Yes   Patient/family informed of their freedom to choose among providers that offer the needed level of care, that participate in Medicare, Medicaid or managed care program needed by the patient, have an available bed and are willing to accept the patient.  Yes   Patient/family informed of [facility]'s ownership interest in [facility] and [facility], as well as of the fact that they are under no obligation to receive care at these facilities.  PASRR submitted to EDS on 02/21/16     PASRR number received on 02/21/16     Existing PASRR number confirmed on       FL2 transmitted to all facilities in geographic area requested by pt/family on 02/21/16     FL2 transmitted to all facilities within larger geographic area on       Patient informed that his/her managed care company has contracts with or will negotiate with certain facilities, including the following:        Yes   Patient/family informed of bed offers received.  Patient chooses bed at [facility]     Physician recommends and patient chooses bed at  [facility])    Patient to be transferred to   on  .  Patient to be transferred to facility by       Patient family notified on   of transfer.  Name of family member notified:        PHYSICIAN       Additional Comment:    _______________________________________________ Darden Dates, LCSW 02/21/2016, 1:59  PM

## 2016-02-21 NOTE — Care Management Important Message (Signed)
Important Message  Patient Details  Name: Spencer Acosta MRN: [id number] Date of Birth: Oct 10, 1915   Medicare Important Message Given:  Yes    Shelbie Ammons, RN 02/21/2016, 8:03 AM

## 2016-02-21 NOTE — Progress Notes (Signed)
[place] INFECTIOUS DISEASE PROGRESS NOTE Date of Admission:  02/19/2016     ID: Spencer Acosta is a 82 y.o. male with Diarhea, crohns and C diff  Active Problems:   Colitis   Subjective:no fevers, abd pain.  5 bm listed yesterday, 2 today. Not large but loose  ROS  Eleven systems are reviewed and negative except per hpi  Medications:  Antibiotics Given (last 72 hours)    Date/Time Action Medication Dose   02/19/16 2309 Given   vancomycin (VANCOCIN) 50 mg/mL oral solution 125 mg 125 mg   02/20/16 0550 Given   vancomycin (VANCOCIN) 50 mg/mL oral solution 125 mg 125 mg   02/20/16 1200 Given   vancomycin (VANCOCIN) 50 mg/mL oral solution 125 mg 125 mg   02/20/16 1858 Given   vancomycin (VANCOCIN) 50 mg/mL oral solution 250 mg 250 mg   02/21/16 0059 Given   vancomycin (VANCOCIN) 50 mg/mL oral solution 250 mg 250 mg   02/21/16 0626 Given   vancomycin (VANCOCIN) 50 mg/mL oral solution 250 mg 250 mg     . acidophilus  1 capsule Oral BID  . cholecalciferol  1,000 Units Oral Daily  . enoxaparin (LOVENOX) injection  40 mg Subcutaneous Q24H  . Mesalamine  800 mg Oral TID  . metoprolol tartrate  25 mg Oral BID  . sodium chloride flush  3 mL Intravenous Q12H  . vancomycin  250 mg Oral 4 times per day    Objective: Vital signs in last 24 hours: Temp:  [97.5 F (36.4 C)-98.6 F (37 C)] 97.9 F (36.6 C) (03/23 1351) Pulse Rate:  [87-111] 87 (03/23 1351) Resp:  [18-20] 20 (03/23 1351) BP: (101-121)/(57-79) 101/57 mmHg (03/23 1351) SpO2:  [94 %-100 %] 100 % (03/23 1351) Constitutional: He is oriented to person, place, and time. HOH  HENT: anicteric Mouth/Throat: Oropharynx is clear and moist. No oropharyngeal exudate.  Cardiovascular: Normal rate, regular rhythm and normal heart sounds. Exam reveals no gallop and no friction rub.  No murmur heard.  Pulmonary/Chest: Effort normal and breath sounds normal. No respiratory distress. He has no wheezes.  Abdominal: Soft.  Bowel sounds are normal. He exhibits no distension. There is no tenderness.  Lymphadenopathy:  He has no cervical adenopathy.  Neurological: He is alert and oriented to person, place, and time.  Skin: Skin is warm and dry. No rash noted. No erythema.  Psychiatric: He has a normal mood and affect. His behavior is normal.   Lab Results  Recent Labs  02/19/16 1117  02/20/16 0446 02/20/16 1543 02/21/16 0442  WBC 12.1*  --  12.4*  --   --   HGB 12.2*  < > 11.0* 12.1*  --   HCT 36.4*  --  32.8*  --   --   NA 139  --  141  --  140  K 2.5*  --  3.3*  --  4.0  CL 104  --  111  --  112*  CO2 26  --  25  --  26  BUN 40*  --  33*  --  25*  CREATININE 1.61*  --  1.27*  --  1.15  < > = values in this interval not displayed.  Microbiology: Results for orders placed or performed during the hospital encounter of 02/19/16  Gastrointestinal Panel by PCR , Stool     Status: None   Collection Time: 02/19/16 10:46 PM  Result Value Ref Range Status   Campylobacter species NOT DETECTED NOT DETECTED  Final   Plesimonas shigelloides NOT DETECTED NOT DETECTED Final   Salmonella species NOT DETECTED NOT DETECTED Final   Yersinia enterocolitica NOT DETECTED NOT DETECTED Final   Vibrio species NOT DETECTED NOT DETECTED Final   Vibrio cholerae NOT DETECTED NOT DETECTED Final   Enteroaggregative E coli (EAEC) NOT DETECTED NOT DETECTED Final   Enteropathogenic E coli (EPEC) NOT DETECTED NOT DETECTED Final   Enterotoxigenic E coli (ETEC) NOT DETECTED NOT DETECTED Final   Shiga like toxin producing E coli (STEC) NOT DETECTED NOT DETECTED Final   E. coli O157 NOT DETECTED NOT DETECTED Final   Shigella/Enteroinvasive E coli (EIEC) NOT DETECTED NOT DETECTED Final   Cryptosporidium NOT DETECTED NOT DETECTED Final   Cyclospora cayetanensis NOT DETECTED NOT DETECTED Final   Entamoeba histolytica NOT DETECTED NOT DETECTED Final   Giardia lamblia NOT DETECTED NOT DETECTED Final   Adenovirus F40/41 NOT DETECTED  NOT DETECTED Final   Astrovirus NOT DETECTED NOT DETECTED Final   Norovirus GI/GII NOT DETECTED NOT DETECTED Final   Rotavirus A NOT DETECTED NOT DETECTED Final   Sapovirus (I, II, IV, and V) NOT DETECTED NOT DETECTED Final  C difficile quick scan w PCR reflex     Status: Abnormal   Collection Time: 02/19/16 10:46 PM  Result Value Ref Range Status   C Diff antigen POSITIVE (A) NEGATIVE Final   C Diff toxin NEGATIVE NEGATIVE Final   C Diff interpretation   Final    Positive for toxigenic C. difficile, active toxin production not detected. Patient has toxigenic C. difficile organisms present in the bowel, but toxin was not detected. The patient may be a carrier or the level of toxin in the sample was below the limit  of detection. This information should be used in conjunction with the patient's clinical history when deciding on possible therapy.     Comment: CRITICAL RESULT CALLED TO, READ BACK BY AND VERIFIED WITH: Olena Mater AT [id number] 02/20/16 DV   MRSA PCR Screening     Status: None   Collection Time: 02/19/16 10:46 PM  Result Value Ref Range Status   MRSA by PCR NEGATIVE NEGATIVE Final    Comment:        The GeneXpert MRSA Assay (FDA approved for NASAL specimens only), is one component of a comprehensive MRSA colonization surveillance program. It is not intended to diagnose MRSA infection nor to guide or monitor treatment for MRSA infections.   Clostridium Difficile by PCR     Status: Abnormal   Collection Time: 02/19/16 10:46 PM  Result Value Ref Range Status   Toxigenic C Difficile by pcr POSITIVE (A) NEGATIVE Final    Comment: CRITICAL RESULT CALLED TO, READ BACK BY AND VERIFIED WITH: [facility] FUENTES AT [id number] 02/20/16 DV     Studies/Results: Ct Abdomen Pelvis Wo Contrast  02/19/2016  CLINICAL DATA:  Diarrhea. Positive for Clostridium difficile. Generalized abdominal pain with mild distension. EXAM: CT ABDOMEN AND PELVIS WITHOUT CONTRAST TECHNIQUE: Multidetector CT  imaging of the abdomen and pelvis was performed following the standard protocol without IV contrast. COMPARISON:  None. FINDINGS: Mild dependent atelectasis is noted in the lung bases. The heart is incompletely visualized but appears mildly enlarged. Coronary artery calcification is partially visualized. There is no pleural effusion. A 7 mm hypodensity in the posterior right hepatic lobe is too small to fully characterize. Multiple small layering stones are present in the gallbladder. There is no biliary dilatation. The spleen and adrenal glands are unremarkable. There is an approximately  1.5 cm fluid attenuation lesion in the proximal pancreatic body. No pancreatic ductal dilatation is seen. Multiple hypoattenuating lesions are present in both kidneys measuring up to 2.2 cm on the right and 2.9 cm on the left, likely cysts but incompletely evaluated on this unenhanced study. A punctate calcification is noted adjacent to the largest lesion on the left. There is a 6 mm hyperattenuating lesion in the posterior upper pole of the left kidney which may represent a proteinaceous or hemorrhagic cyst though is also incompletely evaluated. There is no hydronephrosis. Oral contrast is present in nondilated loops of small and large bowel to the level of the hepatic flexure. There is no evidence of bowel obstruction. There is mild wall thickening involving the cecum and ascending colon. There may also be mild rectosigmoid wall thickening, though evaluation is partially limited by underdistention. Proximal sigmoid colon diverticulosis is noted without frank inflammatory change to clearly indicate acute diverticulitis. Prior appendectomy. There is no evidence of intraperitoneal free air. No free fluid or loculated fluid collection is identified. The bladder is unremarkable. The prostate is small. No enlarged lymph nodes are identified. There are small fat containing inguinal hernias bilaterally. Advanced atherosclerotic  calcification is noted of the abdominal aorta and its major branch vessels. Thoracolumbar spondylosis is noted. IMPRESSION: 1. Mild colonic wall thickening, most notably proximally and compatible with history of C. difficile colitis. No evidence of bowel obstruction, perforation, or abscess. 2. Sigmoid colon diverticulosis. 3. Cholelithiasis. 4. Bilateral renal lesions as above, most likely cysts but incompletely evaluated. 5. 1.5 cm pancreatic body lesion without aggressive features. If follow-up/further characterization is clinically desired, then abdominal MRI (preferably without and with IV contrast) could be performed in 1 year. Electronically Signed   By: Logan Bores M.D.   On: 02/19/2016 16:44    Assessment/Plan: Spencer Acosta is a 82 y.o. male with hx Crohns, A fib, CKD admitted from [facility] with weakness and hypokalemia. He as having diarrhea for 3 weeks and had + C diff test and started on oral flagyl as otpt but did not improve with one week . He has been having bloody stools as well. He was previously on Cipro a few weeks ago for UTI.  C diff test was + 2/20 at [facility]. On admission wbc was 12.4, afebrile. . Started on oral vancomycin  C diff antigen +, toxin EIA neg, PCR is +. Stool comp PCR neg. Hgb has been stable. Stool heme + CT scan with mild colonic wall thickening  Recommendations COntinue oral vancomycin for a 14 day course Avoid other abx if possible Continue asacol per GI and fu GI No need to fu with me unless GI feels it is necessary after they see him in fu  Thank you very much for the consult. Will follow with you.  [place], [place]   02/21/2016, 2:16 PM

## 2016-02-21 NOTE — Care Management (Signed)
Admitted to [facility] with the diagnosis of Colitis. A resident of [facility] since 12/05/2011. Daughter is [name], lives in [place]. [phone]). Last seen Dr. Frazier Richards 01/29/16. Takes care of all basic activities of daily living himself.  C-Diff + Vancomycin IV.  Physical therapy evaluation completed. No follow-up therapy needs. Shelbie Ammons RN MSN CCM Care Management [phone]

## 2016-02-21 NOTE — NC FL2 (Signed)
[place] LEVEL OF CARE SCREENING TOOL     IDENTIFICATION  Patient Name: Spencer Acosta Birthdate: 1914/12/09 Sex: male Admission Date (Current Location): 02/19/2016  [place] and [place] Number:  Engineering geologist and Address:  [facility], [address] [place], [place] [address]      Provider Number: [id number]  Attending Physician Name and Address:  Loletha Grayer, MD  Relative Name and Phone Number:       Current Level of Care: Hospital Recommended Level of Care: [place] Prior Approval Number:    Date Approved/Denied:   PASRR Number: [id number]  Discharge Plan: SNF    Current Diagnoses: Patient Active Problem List   Diagnosis Date Noted  . Colitis 02/19/2016    Orientation RESPIRATION BLADDER Height & Weight     Self, Time, Situation, Place  Normal Continent Weight: 165 lb 8 oz (75.07 kg) Height:  5\' 8"  (172.7 cm)  BEHAVIORAL SYMPTOMS/MOOD NEUROLOGICAL BOWEL NUTRITION STATUS      Continent Diet (Heart Healthy, Thin Liquids)  AMBULATORY STATUS COMMUNICATION OF NEEDS Skin   Supervision Verbally Normal                       Personal Care Assistance Level of Assistance  Bathing, Feeding, Dressing Bathing Assistance: Independent Feeding assistance: Independent Dressing Assistance: Independent     Functional Limitations Info  Sight, Speech, Hearing Sight Info: Adequate Hearing Info: Adequate Speech Info: Adequate    SPECIAL CARE FACTORS FREQUENCY                       Contractures      Additional Factors Info  Code Status, Allergies Code Status Info: DNR Allergies Info: Sulfa Antibiotics           Current Medications (02/21/2016):  This is the current hospital active medication list Current Facility-Administered Medications  Medication Dose Route Frequency Provider Last Rate Last Dose  . 0.9 % NaCl with KCl 20 mEq/ L  infusion   Intravenous Continuous Loletha Grayer, MD  30 mL/hr at 02/21/16 [phone]    . acetaminophen (TYLENOL) tablet 650 mg  650 mg Oral Q6H PRN Gladstone Lighter, MD       Or  . acetaminophen (TYLENOL) suppository 650 mg  650 mg Rectal Q6H PRN Gladstone Lighter, MD      . acidophilus (RISAQUAD) capsule 1 capsule  1 capsule Oral BID Gladstone Lighter, MD   1 capsule at 02/21/16 [id number]  . cholecalciferol (VITAMIN D) tablet 1,000 Units  1,000 Units Oral Daily Gladstone Lighter, MD   1,000 Units at 02/21/16 [phone]  . enoxaparin (LOVENOX) injection 40 mg  40 mg Subcutaneous Q24H Gladstone Lighter, MD   40 mg at 02/20/16 2129  . Mesalamine (ASACOL) DR capsule 800 mg  800 mg Oral TID Ronney Asters, PA-C   800 mg at 02/21/16 [id number]  . metoprolol tartrate (LOPRESSOR) tablet 25 mg  25 mg Oral BID Gladstone Lighter, MD   25 mg at 02/21/16 [id number]  . ondansetron (ZOFRAN) tablet 4 mg  4 mg Oral Q6H PRN Gladstone Lighter, MD       Or  . ondansetron (ZOFRAN) injection 4 mg  4 mg Intravenous Q6H PRN Gladstone Lighter, MD      . sodium chloride flush (NS) 0.9 % injection 3 mL  3 mL Intravenous Q12H Gladstone Lighter, MD   3 mL at 02/21/16 [id number]  . vancomycin (VANCOCIN) 50 mg/mL oral solution 250  mg  250 mg Oral 4 times per day Ronney Asters, PA-C   250 mg at 02/21/16 [id number]     Discharge Medications: Please see discharge summary for a list of discharge medications.  Relevant Imaging Results:  Relevant Lab Results:   Additional Information SSN:  [id number]  Pt is on Enteric Precautions (Contact).   Darden Dates, LCSW

## 2016-02-21 NOTE — Clinical Social Work Note (Signed)
Clinical Social Work Assessment  Patient Details  Name: Spencer Acosta MRN: [id number] Date of Birth: 1915/05/22  Date of referral:   02/21/2016               Reason for consult:  Facility Placement                Permission sought to share information with:  Family Supports Permission granted to share information::  Yes, Verbal Permission Granted  Name::     Snyder,[name]   Housing/Transportation Living arrangements for the past 2 months:  [facility]) Source of Information:  Patient Patient Interpreter Needed:  None Criminal Activity/Legal Involvement Pertinent to Current Situation/Hospitalization:  No - Comment as needed Significant Relationships:  Adult Children Lives with:  Self Do you feel safe going back to the place where you live?  Yes Need for family participation in patient care:  No (Coment)  Care giving concerns:  Per RN at [facility], pt needs a higher level of care prior to returning home.   Social Worker assessment / plan:  CSW met with pt to address consult for SNF. CSW introduced herself and explained role of social work. CSW also explained process of discharging to SNF. Pt lives at the [facility]. Per RN at facility, they have concerns about compliance with medications. Pt will be on oral vanc for 10 days. [facility] is trying to secure the medication. CSW sent referral to facility and they have extended a bed offer. CSW will continue to follow.   Employment status:  Retired Forensic scientist:  Medicare PT Recommendations:  No Follow Up Information / Referral to community resources:  [place]  Patient/Family's Response to care:  Pt was appreciative of CSW support. Pt declined to have CSW call his family as he has been speaking to them.   Patient/Family's Understanding of and Emotional Response to Diagnosis, Current Treatment, and Prognosis:  Pt is open to any  recommendations for placement.   Emotional Assessment Appearance:  Appears younger than stated age Attitude/Demeanor/Rapport:  Other (Appropriate) Affect (typically observed):  Pleasant Orientation:  Oriented to Self, Oriented to Place, Oriented to  Time, Oriented to Situation Alcohol / Substance use:  Never Used Psych involvement (Current and /or in the community):  No (Comment)  Discharge Needs  Concerns to be addressed:  Adjustment to Illness Readmission within the last 30 days:  Yes Current discharge risk:  Chronically ill Barriers to Discharge:  Continued Medical Work up   [employer], LCSW 02/21/2016, 2:04 PM

## 2016-02-22 MED ORDER — VANCOMYCIN 50 MG/ML ORAL SOLUTION: 250.0000 mg | Freq: Four times a day (QID) | ORAL | Status: DC

## 2016-02-22 MED ORDER — METOPROLOL TARTRATE 25 MG PO TABS: 25.0000 mg | ORAL_TABLET | Freq: Two times a day (BID) | ORAL | Status: AC

## 2016-02-22 MED ORDER — RISAQUAD PO CAPS: 1.0000 | ORAL_CAPSULE | Freq: Two times a day (BID) | ORAL | Status: AC

## 2016-02-22 MED ORDER — MESALAMINE 400 MG PO CPDR: 800.0000 mg | DELAYED_RELEASE_CAPSULE | Freq: Three times a day (TID) | ORAL | Status: AC

## 2016-02-22 NOTE — Progress Notes (Signed)
MD making rounds. Discharge orders received to transfer to [facility]. IV discontinued. Discharge paperwork facilitated by Johnnye Lana. Report called to [place], LPN at [facility]. No unanswered questions. Called EMS for transport. Awaiting EMS.

## 2016-02-22 NOTE — Discharge Summary (Signed)
Spencer Acosta NAME: Spencer Acosta    MR#:  [id number]  DATE OF BIRTH:  November 17, 1915  DATE OF ADMISSION:  02/19/2016 ADMITTING PHYSICIAN: Spencer Lighter, MD  DATE OF DISCHARGE: 02/22/2016  PRIMARY CARE PHYSICIAN: Spencer Acosta., MD    ADMISSION DIAGNOSIS:  Dehydration [E86.0] Hypokalemia [E87.6] Chronic atrial fibrillation ([place]) [I48.2] Acute renal failure, unspecified acute renal failure type ([place]) [N17.9] Gastrointestinal hemorrhage, unspecified gastritis, unspecified gastrointestinal hemorrhage type [K92.2]  DISCHARGE DIAGNOSIS:  Active Problems:   Colitis   SECONDARY DIAGNOSIS:   Past Medical History  Diagnosis Date  . Hypertension   . Diabetes mellitus without complication ([place])   . Crohn's disease ([place])   . A-fib ([place])   . Hyperlipidemia   . PVD (peripheral vascular disease) ([place])   . CKD (chronic kidney disease)     HOSPITAL COURSE:   1. Clostridium difficile colitis. Patient failed outpatient treatment with Flagyl. Was having a lot of diarrhea when coming in. Patient was switched over to oral vancomycin. Patient's diarrhea has slowed down. He is stable to get out to rehabilitation today. Here in the hospital, we only have oral vancomycin solution. I was unable to write a computerized prescription for pill form. I wrote a hand prescription for the pill form of 250 mg every 6 hours for 11 more days. 2. Atrial fibrillation. Metoprolol prescribed instead of hydrochlorothiazide. I'm hesitant on anticoagulation and a 82 year old patient that came in initially with rectal bleeding. 3. Crohn's colitis on Asacol 4. Hypokalemia and hypomagnesemia. Electrolytes replaced during the hospital course. 5. Acute renal failure on chronic kidney disease stage III. Creatinine has improved to 1.15. Creatinine upon admission was 1.61. 6. Type 2 diabetes without complication diet controlled 7. Essential hypertension stable  on metoprolol 8. Glaucoma unspecified on latanoprost 9. Rectal bleeding with C. difficile colitis. Hemoglobin stable  DISCHARGE CONDITIONS:   Satisfactory  CONSULTS OBTAINED:  Treatment Team:  Spencer Prows, MD Spencer Luster, MD  DRUG ALLERGIES:   Allergies  Allergen Reactions  . Sulfa Antibiotics Nausea And Vomiting    DISCHARGE MEDICATIONS:   Current Discharge Medication List    START taking these medications   Details  acidophilus (RISAQUAD) CAPS capsule Take 1 capsule by mouth 2 (two) times daily. Qty: 60 capsule, Refills: 0    Mesalamine (ASACOL) 400 MG CPDR DR capsule Take 2 capsules (800 mg total) by mouth 3 (three) times daily. Qty: 180 capsule, Refills: 0    metoprolol tartrate (LOPRESSOR) 25 MG tablet Take 1 tablet (25 mg total) by mouth 2 (two) times daily. Qty: 60 tablet, Refills: 0    vancomycin (VANCOCIN) 50 mg/mL oral solution Take 5 mLs (250 mg total) by mouth every 6 (six) hours.      CONTINUE these medications which have NOT CHANGED   Details  cholecalciferol (VITAMIN D) 1000 units tablet Take 1,000 Units by mouth daily.    latanoprost (XALATAN) 0.005 % ophthalmic solution Place 1 drop into both eyes at bedtime.      STOP taking these medications     hydrochlorothiazide (MICROZIDE) 12.5 MG capsule      mesalamine (ASACOL) 400 MG EC tablet          DISCHARGE INSTRUCTIONS:   Follow-up with medical doctor at rehabilitation Follow up with physical therapy rehabilitation  If you experience worsening of your admission symptoms, develop shortness of breath, life threatening emergency, suicidal or homicidal thoughts you must seek medical attention immediately by calling 911 or  calling your MD immediately  if symptoms less severe.  You Must read complete instructions/literature along with all the possible adverse reactions/side effects for all the Medicines you take and that have been prescribed to you. Take any new Medicines after you have  completely understood and accept all the possible adverse reactions/side effects.   Please note  You were cared for by a hospitalist during your hospital stay. If you have any questions about your discharge medications or the care you received while you were in the hospital after you are discharged, you can call the unit and asked to speak with the hospitalist on call if the hospitalist that took care of you is not available. Once you are discharged, your primary care physician will handle any further medical issues. Please note that NO REFILLS for any discharge medications will be authorized once you are discharged, as it is imperative that you return to your primary care physician (or establish a relationship with a primary care physician if you do not have one) for your aftercare needs so that they can reassess your need for medications and monitor your lab values.    Today   CHIEF COMPLAINT:   Chief Complaint  Patient presents with  . Rectal Bleeding    HISTORY OF PRESENT ILLNESS:  Spencer Acosta  is a 82 y.o. male presented with rectal bleeding   VITAL SIGNS:  Blood pressure 128/81, pulse 106, temperature 97.8 F (36.6 C), temperature source Oral, resp. rate 20, height 5\' 8"  (1.727 m), weight 75.07 kg (165 lb 8 oz), SpO2 100 %.    PHYSICAL EXAMINATION:  GENERAL:  82 y.o.-year-old patient lying in the bed with no acute distress.  EYES: Pupils equal, round, reactive to light and accommodation. No scleral icterus. Extraocular muscles intact.  HEENT: Head atraumatic, normocephalic. Oropharynx and nasopharynx clear.  NECK:  Supple, no jugular venous distention. No thyroid enlargement, no tenderness.  LUNGS: Normal breath sounds bilaterally, no wheezing, rales,rhonchi or crepitation. No use of accessory muscles of respiration.  CARDIOVASCULAR: S1, S2 regular regular. No murmurs, rubs, or gallops.  ABDOMEN: Soft, non-tender, non-distended. Bowel sounds present. No organomegaly or mass.   EXTREMITIES: Trace edema, no cyanosis, or clubbing.  NEUROLOGIC: Cranial nerves II through XII are intact. Muscle strength 5/5 in all extremities. Sensation intact. Gait not checked.  PSYCHIATRIC: The patient is alert.  SKIN: No obvious rash, lesion, or ulcer.   DATA REVIEW:   CBC  Recent Labs Lab 02/20/16 0446 02/20/16 1543  WBC 12.4*  --   HGB 11.0* 12.1*  HCT 32.8*  --   PLT 194  --     Chemistries   Recent Labs Lab 02/19/16 1117  02/21/16 0442  NA 139  < > 140  K 2.5*  < > 4.0  CL 104  < > 112*  CO2 26  < > 26  GLUCOSE 155*  < > 128*  BUN 40*  < > 25*  CREATININE 1.61*  < > 1.15  CALCIUM 7.7*  < > 7.3*  MG  --   < > 1.8  AST 18  --   --   ALT 12*  --   --   ALKPHOS 55  --   --   BILITOT 1.0  --   --   < > = values in this interval not displayed.   Microbiology Results  Results for orders placed or performed during the hospital encounter of 02/19/16  Gastrointestinal Panel by PCR , Stool  Status: None   Collection Time: 02/19/16 10:46 PM  Result Value Ref Range Status   Campylobacter species NOT DETECTED NOT DETECTED Final   Plesimonas shigelloides NOT DETECTED NOT DETECTED Final   Salmonella species NOT DETECTED NOT DETECTED Final   Yersinia enterocolitica NOT DETECTED NOT DETECTED Final   Vibrio species NOT DETECTED NOT DETECTED Final   Vibrio cholerae NOT DETECTED NOT DETECTED Final   Enteroaggregative E coli (EAEC) NOT DETECTED NOT DETECTED Final   Enteropathogenic E coli (EPEC) NOT DETECTED NOT DETECTED Final   Enterotoxigenic E coli (ETEC) NOT DETECTED NOT DETECTED Final   Shiga like toxin producing E coli (STEC) NOT DETECTED NOT DETECTED Final   E. coli O157 NOT DETECTED NOT DETECTED Final   Shigella/Enteroinvasive E coli (EIEC) NOT DETECTED NOT DETECTED Final   Cryptosporidium NOT DETECTED NOT DETECTED Final   Cyclospora cayetanensis NOT DETECTED NOT DETECTED Final   Entamoeba histolytica NOT DETECTED NOT DETECTED Final   Giardia lamblia  NOT DETECTED NOT DETECTED Final   Adenovirus F40/41 NOT DETECTED NOT DETECTED Final   Astrovirus NOT DETECTED NOT DETECTED Final   Norovirus GI/GII NOT DETECTED NOT DETECTED Final   Rotavirus A NOT DETECTED NOT DETECTED Final   Sapovirus (I, II, IV, and V) NOT DETECTED NOT DETECTED Final  C difficile quick scan w PCR reflex     Status: Abnormal   Collection Time: 02/19/16 10:46 PM  Result Value Ref Range Status   C Diff antigen POSITIVE (A) NEGATIVE Final   C Diff toxin NEGATIVE NEGATIVE Final   C Diff interpretation   Final    Positive for toxigenic C. difficile, active toxin production not detected. Patient has toxigenic C. difficile organisms present in the bowel, but toxin was not detected. The patient may be a carrier or the level of toxin in the sample was below the limit  of detection. This information should be used in conjunction with the patient's clinical history when deciding on possible therapy.     Comment: CRITICAL RESULT CALLED TO, READ BACK BY AND VERIFIED WITH: Olena Mater AT [id number] 02/20/16 DV   MRSA PCR Screening     Status: None   Collection Time: 02/19/16 10:46 PM  Result Value Ref Range Status   MRSA by PCR NEGATIVE NEGATIVE Final    Comment:        The GeneXpert MRSA Assay (FDA approved for NASAL specimens only), is one component of a comprehensive MRSA colonization surveillance program. It is not intended to diagnose MRSA infection nor to guide or monitor treatment for MRSA infections.   Clostridium Difficile by PCR     Status: Abnormal   Collection Time: 02/19/16 10:46 PM  Result Value Ref Range Status   Toxigenic C Difficile by pcr POSITIVE (A) NEGATIVE Final    Comment: CRITICAL RESULT CALLED TO, READ BACK BY AND VERIFIED WITH: Olena Mater AT [id number] 02/20/16 DV     Management plans discussed with the patient, and he is in agreement.  CODE STATUS:     Code Status Orders        Start     Ordered   02/19/16 2129  Do not attempt  resuscitation (DNR)   Continuous    Question Answer Comment  In the event of cardiac or respiratory ARREST Do not call a "code blue"   In the event of cardiac or respiratory ARREST Do not perform Intubation, CPR, defibrillation or ACLS   In the event of cardiac or respiratory ARREST Use medication by  any route, position, wound care, and other measures to relive pain and suffering. May use oxygen, suction and manual treatment of airway obstruction as needed for comfort.      02/19/16 2128    Code Status History    Date Active Date Inactive Code Status Order ID Comments User Context   This patient has a current code status but no historical code status.    Advance Directive Documentation        Most Recent Value   Type of Advance Directive  Healthcare Power of [place], Living will [Shan Aqeel Relyea (daughter)]   Pre-existing out of facility DNR order (yellow form or pink MOST form)     "MOST" Form in Place?        TOTAL TIME TAKING CARE OF THIS PATIENT: 35 minutes    Agueda Houpt M.D on 02/22/2016 at 9:29 AM  Between 7am to 6pm - Pager - [phone]  After 6pm go to [email or web address] - password [email or web address] [facility]  Office  [phone]  CC: Primary care physician; Spencer Acosta., MD

## 2016-02-22 NOTE — Progress Notes (Signed)
Discharged via EMS. Belongings sent with patient and EMS.

## 2016-02-22 NOTE — Clinical Social Work Note (Signed)
Pt is ready for discharge today to [facility]. Facility has received all discharge information and is ready to admit pt. Pt is aware and agreeable to discharge plan. CSW left a message with pt's daughter and spoke with pt's son in law to provide update. SIL will update wife as well. Pt's family is in agreement. RN called report and [facility] will provide transportation. CSW is singing off as no further needs identified.   Darden Dates, MSW, LCSW Clinical Social Worker  [phone]

## 2016-02-22 NOTE — Consult Note (Signed)
  GI Inpatient Follow-up Note  Patient Identification: Spencer Acosta is a 82 y.o. male  Subjective: Feeling better. Less diarrhea.  Scheduled Inpatient Medications:  . acidophilus  1 capsule Oral BID  . cholecalciferol  1,000 Units Oral Daily  . enoxaparin (LOVENOX) injection  40 mg Subcutaneous Q24H  . Mesalamine  800 mg Oral TID  . metoprolol tartrate  25 mg Oral BID  . sodium chloride flush  3 mL Intravenous Q12H  . vancomycin  250 mg Oral 4 times per day    Continuous Inpatient Infusions:     PRN Inpatient Medications:  acetaminophen **OR** acetaminophen, ondansetron **OR** ondansetron (ZOFRAN) IV  Review of Systems: Constitutional: Weight is stable.  Eyes: No changes in vision. ENT: No oral lesions, sore throat.  GI: see HPI.  Heme/Lymph: No easy bruising.  CV: No chest pain.  GU: No hematuria.  Integumentary: No rashes.  Neuro: No headaches.  Psych: No depression/anxiety.  Endocrine: No heat/cold intolerance.  Allergic/Immunologic: No urticaria.  Resp: No cough, SOB.  Musculoskeletal: No joint swelling.    Physical Examination: BP 128/81 mmHg  Pulse 106  Temp(Src) 97.8 F (36.6 C) (Oral)  Resp 20  Ht 5\' 8"  (1.727 m)  Wt 75.07 kg (165 lb 8 oz)  BMI 25.17 kg/m2  SpO2 100% Gen: NAD, alert and oriented x 4 HEENT: PEERLA, EOMI, Neck: supple, no JVD or thyromegaly Chest: CTA bilaterally, no wheezes, crackles, or other adventitious sounds CV: RRR, no m/g/c/r Abd: soft, NT, ND, +BS in all four quadrants; no HSM, guarding, ridigity, or rebound tenderness Ext: no edema, well perfused with 2+ pulses, Skin: no rash or lesions noted Lymph: no LAD  Data: Lab Results  Component Value Date   WBC 12.4* 02/20/2016   HGB 12.1* 02/20/2016   HCT 32.8* 02/20/2016   MCV 95.3 02/20/2016   PLT 194 02/20/2016    Recent Labs Lab 02/20/16 0020 02/20/16 0446 02/20/16 1543  HGB 11.5* 11.0* 12.1*   Lab Results  Component Value Date   NA 140 02/21/2016   K 4.0  02/21/2016   CL 112* 02/21/2016   CO2 26 02/21/2016   BUN 25* 02/21/2016   CREATININE 1.15 02/21/2016   Lab Results  Component Value Date   ALT 12* 02/19/2016   AST 18 02/19/2016   ALKPHOS 55 02/19/2016   BILITOT 1.0 02/19/2016    Recent Labs Lab 02/19/16 1117  INR 1.38   Assessment/Plan: Mr. Spencer Acosta is a 82 y.o. male  With chronic diarrhea.  Recommendations: Vanco x 2 weeks and asacol x 4 weeks. Pt can f/u with [place] in GI in few weeks after discharge. Will sign off but call back if symptoms change. Thanks. Please call with questions or concerns.  Naketa Daddario, Lupita Dawn, MD

## 2016-02-22 NOTE — Clinical Social Work Placement (Signed)
   CLINICAL SOCIAL WORK PLACEMENT  NOTE  Date:  02/22/2016  Patient Details  Name: Spencer Acosta MRN: [id number] Date of Birth: 02/28/1915  Clinical Social Work is seeking post-discharge placement for this patient at the [place] level of care (*CSW will initial, date and re-position this form in  chart as items are completed):  Yes   Patient/family provided with [place] Work Department's list of facilities offering this level of care within the geographic area requested by the patient (or if unable, by the patient's family).  Yes   Patient/family informed of their freedom to choose among providers that offer the needed level of care, that participate in Medicare, Medicaid or managed care program needed by the patient, have an available bed and are willing to accept the patient.  Yes   Patient/family informed of [facility]'s ownership interest in [facility] and [facility], as well as of the fact that they are under no obligation to receive care at these facilities.  PASRR submitted to EDS on 02/21/16     PASRR number received on 02/21/16     Existing PASRR number confirmed on       FL2 transmitted to all facilities in geographic area requested by pt/family on 02/21/16     FL2 transmitted to all facilities within larger geographic area on       Patient informed that his/her managed care company has contracts with or will negotiate with certain facilities, including the following:        Yes   Patient/family informed of bed offers received.  Patient chooses bed at [facility]     Physician recommends and patient chooses bed at  [facility])    Patient to be transferred to [facility] on 02/22/16.  Patient to be transferred to facility by [facility]     Patient family notified on 02/22/16 of transfer.  Name of family member notified:  Pt's son in law and pt's daughrter, [name]     PHYSICIAN       Additional Comment:     _______________________________________________ Darden Dates, LCSW 02/22/2016, 2:09 PM

## 2016-02-25 DIAGNOSIS — K5000 Crohn's disease of small intestine without complications: Secondary | ICD-10-CM | POA: Diagnosis not present

## 2016-02-25 DIAGNOSIS — E119 Type 2 diabetes mellitus without complications: Secondary | ICD-10-CM | POA: Diagnosis not present

## 2016-02-25 DIAGNOSIS — A047 Enterocolitis due to Clostridium difficile: Secondary | ICD-10-CM | POA: Diagnosis not present

## 2016-02-25 DIAGNOSIS — I482 Chronic atrial fibrillation: Secondary | ICD-10-CM | POA: Diagnosis not present

## 2016-03-06 DIAGNOSIS — K5000 Crohn's disease of small intestine without complications: Secondary | ICD-10-CM | POA: Diagnosis not present

## 2016-03-06 DIAGNOSIS — A047 Enterocolitis due to Clostridium difficile: Secondary | ICD-10-CM | POA: Diagnosis not present

## 2016-03-06 DIAGNOSIS — I482 Chronic atrial fibrillation: Secondary | ICD-10-CM | POA: Diagnosis not present

## 2016-03-06 DIAGNOSIS — E119 Type 2 diabetes mellitus without complications: Secondary | ICD-10-CM | POA: Diagnosis not present

## 2016-03-16 ENCOUNTER — Encounter: Payer: Self-pay | Admitting: Emergency Medicine

## 2016-03-16 DIAGNOSIS — Z7951 Long term (current) use of inhaled steroids: Secondary | ICD-10-CM | POA: Diagnosis not present

## 2016-03-16 DIAGNOSIS — N189 Chronic kidney disease, unspecified: Secondary | ICD-10-CM | POA: Insufficient documentation

## 2016-03-16 DIAGNOSIS — I4891 Unspecified atrial fibrillation: Secondary | ICD-10-CM | POA: Insufficient documentation

## 2016-03-16 DIAGNOSIS — J40 Bronchitis, not specified as acute or chronic: Secondary | ICD-10-CM | POA: Insufficient documentation

## 2016-03-16 DIAGNOSIS — Z87891 Personal history of nicotine dependence: Secondary | ICD-10-CM | POA: Insufficient documentation

## 2016-03-16 DIAGNOSIS — E119 Type 2 diabetes mellitus without complications: Secondary | ICD-10-CM | POA: Diagnosis not present

## 2016-03-16 DIAGNOSIS — R05 Cough: Secondary | ICD-10-CM | POA: Diagnosis present

## 2016-03-16 DIAGNOSIS — I129 Hypertensive chronic kidney disease with stage 1 through stage 4 chronic kidney disease, or unspecified chronic kidney disease: Secondary | ICD-10-CM | POA: Diagnosis not present

## 2016-03-16 NOTE — ED Notes (Addendum)
Daughter reports productive cough for 3 days, then says the cough started saturday; denies fever; pt denies pain;

## 2016-03-17 ENCOUNTER — Emergency Department: Payer: Medicare Other

## 2016-03-17 ENCOUNTER — Emergency Department
Admission: EM | Admit: 2016-03-17 | Discharge: 2016-03-17 | Disposition: A | Discharge status: Home / Self Care | Payer: Medicare Other | Attending: Emergency Medicine | Admitting: Emergency Medicine

## 2016-03-17 DIAGNOSIS — J40 Bronchitis, not specified as acute or chronic: Secondary | ICD-10-CM

## 2016-03-17 DIAGNOSIS — R05 Cough: Secondary | ICD-10-CM

## 2016-03-17 DIAGNOSIS — R059 Cough, unspecified: Secondary | ICD-10-CM

## 2016-03-17 MED ORDER — METOPROLOL TARTRATE 25 MG PO TABS
25.0000 mg | ORAL_TABLET | Freq: Once | ORAL | Status: AC
  Administered 2016-03-17: 25 mg via ORAL
  Filled 2016-03-17: qty 1

## 2016-03-17 MED ORDER — PREDNISONE 20 MG PO TABS: 60.0000 mg | ORAL_TABLET | Freq: Every day | ORAL | Status: DC

## 2016-03-17 MED ORDER — ALBUTEROL SULFATE HFA 108 (90 BASE) MCG/ACT IN AERS: 2.0000 | INHALATION_SPRAY | Freq: Four times a day (QID) | RESPIRATORY_TRACT | Status: DC | PRN

## 2016-03-17 MED ORDER — IPRATROPIUM-ALBUTEROL 0.5-2.5 (3) MG/3ML IN SOLN
3.0000 mL | Freq: Once | RESPIRATORY_TRACT | Status: AC
  Administered 2016-03-17: 3 mL via RESPIRATORY_TRACT
  Filled 2016-03-17: qty 3

## 2016-03-17 MED ORDER — PREDNISONE 20 MG PO TABS
60.0000 mg | ORAL_TABLET | Freq: Once | ORAL | Status: AC
  Administered 2016-03-17: 60 mg via ORAL
  Filled 2016-03-17: qty 3

## 2016-03-17 MED ORDER — BENZONATATE 100 MG PO CAPS: 100.0000 mg | ORAL_CAPSULE | Freq: Four times a day (QID) | ORAL | Status: DC | PRN

## 2016-03-17 NOTE — Discharge Instructions (Signed)
Cough, Adult °Coughing is a reflex that clears your throat and your airways. Coughing helps to heal and protect your lungs. It is normal to cough occasionally, but a cough that happens with other symptoms or lasts a long time may be a sign of a condition that needs treatment. A cough may last only 2-3 weeks (acute), or it may last longer than 8 weeks (chronic). °CAUSES °Coughing is commonly caused by: °· Breathing in substances that irritate your lungs. °· A viral or bacterial respiratory infection. °· Allergies. °· Asthma. °· Postnasal drip. °· Smoking. °· Acid backing up from the stomach into the esophagus (gastroesophageal reflux). °· Certain medicines. °· Chronic lung problems, including COPD (or rarely, lung cancer). °· Other medical conditions such as heart failure. °HOME CARE INSTRUCTIONS  °Pay attention to any changes in your symptoms. Take these actions to help with your discomfort: °· Take medicines only as told by your health care provider. °· If you were prescribed an antibiotic medicine, take it as told by your health care provider. Do not stop taking the antibiotic even if you start to feel better. °· Talk with your health care provider before you take a cough suppressant medicine. °· Drink enough fluid to keep your urine clear or pale yellow. °· If the air is dry, use a cold steam vaporizer or humidifier in your bedroom or your home to help loosen secretions. °· Avoid anything that causes you to cough at work or at home. °· If your cough is worse at night, try sleeping in a semi-upright position. °· Avoid cigarette smoke. If you smoke, quit smoking. If you need help quitting, ask your health care provider. °· Avoid caffeine. °· Avoid alcohol. °· Rest as needed. °SEEK MEDICAL CARE IF:  °· You have new symptoms. °· You cough up pus. °· Your cough does not get better after 2-3 weeks, or your cough gets worse. °· You cannot control your cough with suppressant medicines and you are losing sleep. °· You  develop pain that is getting worse or pain that is not controlled with pain medicines. °· You have a fever. °· You have unexplained weight loss. °· You have night sweats. °SEEK IMMEDIATE MEDICAL CARE IF: °· You cough up blood. °· You have difficulty breathing. °· Your heartbeat is very fast. °  °This information is not intended to replace advice given to you by your health care provider. Make sure you discuss any questions you have with your health care provider. °  °Document Released: 05/16/2011 Document Revised: 08/08/2015 Document Reviewed: 01/24/2015 °Elsevier Interactive Patient Education ©2016 Elsevier Inc. ° °Upper Respiratory Infection, Adult °Most upper respiratory infections (URIs) are a viral infection of the air passages leading to the lungs. A URI affects the nose, throat, and upper air passages. The most common type of URI is nasopharyngitis and is typically referred to as "the common cold." °URIs run their course and usually go away on their own. Most of the time, a URI does not require medical attention, but sometimes a bacterial infection in the upper airways can follow a viral infection. This is called a secondary infection. Sinus and middle ear infections are common types of secondary upper respiratory infections. °Bacterial pneumonia can also complicate a URI. A URI can worsen asthma and chronic obstructive pulmonary disease (COPD). Sometimes, these complications can require emergency medical care and may be life threatening.  °CAUSES °Almost all URIs are caused by viruses. A virus is a type of germ and can spread from one   person to another.  °RISKS FACTORS °You may be at risk for a URI if:  °· You smoke.   °· You have chronic heart or lung disease. °· You have a weakened defense (immune) system.   °· You are very young or very old.   °· You have nasal allergies or asthma. °· You work in crowded or poorly ventilated areas. °· You work in health care facilities or schools. °SIGNS AND SYMPTOMS    °Symptoms typically develop 2-3 days after you come in contact with a cold virus. Most viral URIs last 7-10 days. However, viral URIs from the influenza virus (flu virus) can last 14-18 days and are typically more severe. Symptoms may include:  °· Runny or stuffy (congested) nose.   °· Sneezing.   °· Cough.   °· Sore throat.   °· Headache.   °· Fatigue.   °· Fever.   °· Loss of appetite.   °· Pain in your forehead, behind your eyes, and over your cheekbones (sinus pain). °· Muscle aches.   °DIAGNOSIS  °Your health care provider may diagnose a URI by: °· Physical exam. °· Tests to check that your symptoms are not due to another condition such as: °¨ Strep throat. °¨ Sinusitis. °¨ Pneumonia. °¨ Asthma. °TREATMENT  °A URI goes away on its own with time. It cannot be cured with medicines, but medicines may be prescribed or recommended to relieve symptoms. Medicines may help: °· Reduce your fever. °· Reduce your cough. °· Relieve nasal congestion. °HOME CARE INSTRUCTIONS  °· Take medicines only as directed by your health care provider.   °· Gargle warm saltwater or take cough drops to comfort your throat as directed by your health care provider. °· Use a warm mist humidifier or inhale steam from a shower to increase air moisture. This may make it easier to breathe. °· Drink enough fluid to keep your urine clear or pale yellow.   °· Eat soups and other clear broths and maintain good nutrition.   °· Rest as needed.   °· Return to work when your temperature has returned to normal or as your health care provider advises. You may need to stay home longer to avoid infecting others. You can also use a face mask and careful hand washing to prevent spread of the virus. °· Increase the usage of your inhaler if you have asthma.   °· Do not use any tobacco products, including cigarettes, chewing tobacco, or electronic cigarettes. If you need help quitting, ask your health care provider. °PREVENTION  °The best way to protect  yourself from getting a cold is to practice good hygiene.  °· Avoid oral or hand contact with people with cold symptoms.   °· Wash your hands often if contact occurs.   °There is no clear evidence that vitamin C, vitamin E, echinacea, or exercise reduces the chance of developing a cold. However, it is always recommended to get plenty of rest, exercise, and practice good nutrition.  °SEEK MEDICAL CARE IF:  °· You are getting worse rather than better.   °· Your symptoms are not controlled by medicine.   °· You have chills. °· You have worsening shortness of breath. °· You have brown or red mucus. °· You have yellow or brown nasal discharge. °· You have pain in your face, especially when you bend forward. °· You have a fever. °· You have swollen neck glands. °· You have pain while swallowing. °· You have white areas in the back of your throat. °SEEK IMMEDIATE MEDICAL CARE IF:  °· You have severe or persistent: °¨ Headache. °¨ Ear pain. °¨   Sinus pain. °¨ Chest pain. °· You have chronic lung disease and any of the following: °¨ Wheezing. °¨ Prolonged cough. °¨ Coughing up blood. °¨ A change in your usual mucus. °· You have a stiff neck. °· You have changes in your: °¨ Vision. °¨ Hearing. °¨ Thinking. °¨ Mood. °MAKE SURE YOU:  °· Understand these instructions. °· Will watch your condition. °· Will get help right away if you are not doing well or get worse. °  °This information is not intended to replace advice given to you by your health care provider. Make sure you discuss any questions you have with your health care provider. °  °Document Released: 05/13/2001 Document Revised: 04/03/2015 Document Reviewed: 02/22/2014 °Elsevier Interactive Patient Education ©2016 Elsevier Inc. ° °

## 2016-03-17 NOTE — ED Provider Notes (Signed)
[facility] Emergency Department Provider Note  ____________________________________________  Time seen: Approximately 132 AM  I have reviewed the triage vital signs and the nursing notes.   HISTORY  Chief Complaint Cough    HPI BALLARD THIBODEAUX is a 82 y.o. male who comes into the hospital today with the cold. The patient reports that he started having symptoms on Saturday. It has been getting worse. He reports it seems to be a chest cold with a cough that he has some phlegm that comes up but it doesn't come out so they're unsure what color. The patient's nose is also been dripping a little bit. He's had no vomiting and no fevers that they have recorded. The patient has not taken any medication for his symptoms. He typically sees Dr. Ouida Sills but hasn't seen him recently. The patient did feel short of breath prior to coming into the hospitalist is what brought him in. The patient said he felt like he had a bubble in his lungs and his daughter was worried. The patient is here tonight for evaluation of his cough. The patient's daughter was concerned that he may have some pneumonia because it came on suddenly.   Past Medical History  Diagnosis Date  . Hypertension   . Diabetes mellitus without complication ([place])   . Crohn's disease ([place])   . A-fib ([place])   . PVD (peripheral vascular disease) ([place])   . CKD (chronic kidney disease)     Patient Active Problem List   Diagnosis Date Noted  . Colitis 02/19/2016    Past Surgical History  Procedure Laterality Date  . Appendectomy    . Turp vaporization      Current Outpatient Rx  Name  Route  Sig  Dispense  Refill  . acidophilus (RISAQUAD) CAPS capsule   Oral   Take 1 capsule by mouth 2 (two) times daily.   60 capsule   0   . albuterol (PROVENTIL HFA;VENTOLIN HFA) 108 (90 Base) MCG/ACT inhaler   Inhalation   Inhale 2 puffs into the lungs every 6 (six) hours as needed.   1 Inhaler   0   . benzonatate  (TESSALON PERLES) 100 MG capsule   Oral   Take 1 capsule (100 mg total) by mouth every 6 (six) hours as needed for cough.   15 capsule   0   . cholecalciferol (VITAMIN D) 1000 units tablet   Oral   Take 1,000 Units by mouth daily.         [name] Kitchen latanoprost (XALATAN) 0.005 % ophthalmic solution   Both Eyes   Place 1 drop into both eyes at bedtime.         . Mesalamine (ASACOL) 400 MG CPDR DR capsule   Oral   Take 2 capsules (800 mg total) by mouth 3 (three) times daily.   180 capsule   0   . metoprolol tartrate (LOPRESSOR) 25 MG tablet   Oral   Take 1 tablet (25 mg total) by mouth 2 (two) times daily.   60 tablet   0   . predniSONE (DELTASONE) 20 MG tablet   Oral   Take 3 tablets (60 mg total) by mouth daily.   12 tablet   0   . vancomycin (VANCOCIN) 50 mg/mL oral solution   Oral   Take 5 mLs (250 mg total) by mouth every 6 (six) hours.           Hard script written for pill form. I was unable  to ...     Allergies Sulfa antibiotics  History reviewed. No pertinent family history.  Social History Social History  Substance Use Topics  . Smoking status: Former Research scientist (life sciences)  . Smokeless tobacco: Never Used     Comment: quit 70 years ago  . Alcohol Use: No     Comment: occasional    Review of Systems Constitutional: No fever/chills Eyes: No visual changes. ENT: Runny nose Cardiovascular: Denies chest pain. Respiratory: Cough and shortness of breath. Gastrointestinal: No abdominal pain.  No nausea, no vomiting.  No diarrhea.  No constipation. Genitourinary: Negative for dysuria. Musculoskeletal: Negative for back pain. Skin: Negative for rash. Neurological: Negative for headaches, focal weakness or numbness.  10-point ROS otherwise negative.  ____________________________________________   PHYSICAL EXAM:  VITAL SIGNS: ED Triage Vitals  Enc Vitals Group     BP 03/16/16 2347 129/69 mmHg     Pulse Rate 03/16/16 2347 88     Resp 03/16/16 2347 20     Temp  03/16/16 2347 98.9 F (37.2 C)     Temp Source 03/16/16 2347 Oral     SpO2 03/16/16 2347 97 %     Weight 03/16/16 2347 170 lb (77.111 kg)     Height 03/16/16 2347 5\' 8"  (1.727 m)     Head Cir --      Peak Flow --      Pain Score 03/16/16 2348 0     Pain Loc --      Pain Edu? --      Excl. in [place]? --     Constitutional: Alert and oriented. Well appearing and in Mild distress. Eyes: Conjunctivae are normal. PERRL. EOMI. Head: Atraumatic. Nose: No congestion/rhinnorhea. Mouth/Throat: Mucous membranes are moist.  Oropharynx non-erythematous. Cardiovascular: Normal rate, regular rhythm. Grossly normal heart sounds.  Good peripheral circulation. Respiratory: Normal respiratory effort.  No retractions. Expiratory wheezes in all lung fields Gastrointestinal: Soft and nontender. No distention. Positive bowel sounds Musculoskeletal: No lower extremity tenderness nor edema.   Neurologic:  Normal speech and language.  Skin:  Skin is warm, dry and intact.  Psychiatric: Mood and affect are normal.   ____________________________________________   LABS (all labs ordered are listed, but only abnormal results are displayed)  Labs Reviewed - No data to display ____________________________________________  EKG  None ____________________________________________  RADIOLOGY  Chest x-ray: Mild cardiomegaly without pulmonary edema, intrathoracic aorta repairs ectatic and torturous in nature, mild bibasilar subsegmental atelectasis/scarring no focal infiltrates identified, question trace right pleural effusion. ____________________________________________   PROCEDURES  Procedure(s) performed: None  Critical Care performed: No  ____________________________________________   INITIAL IMPRESSION / ASSESSMENT AND PLAN / ED COURSE  Pertinent labs & imaging results that were available during my care of the patient were reviewed by me and considered in my medical decision making (see chart for  details).  This is a 82 year old male who comes into the hospital today with some cough. The patient did have some shortness of breath along with his cough earlier this evening which is why his daughter brought him into the hospital. The patient does have some A. fib with some occasional RVR but he is not having any chest pain symptoms at this time. I did give the patient a DuoNeb treatment as he was having some wheezing and his wheezing was all. The patient also received a dose of benzonatate. He does continue to have a cough but it is not severe without paroxysms. The patient will be discharged home to follow back up with  his primary care physician. He did receive a dose of metoprolol for his A. fib but again is not having any symptoms of it at this time. He'll be discharged to home. ____________________________________________   FINAL CLINICAL IMPRESSION(S) / ED DIAGNOSES  Final diagnoses:  Cough  Bronchitis      Loney Hering, MD 03/17/16 [phone]

## 2016-03-20 DIAGNOSIS — I4891 Unspecified atrial fibrillation: Secondary | ICD-10-CM | POA: Insufficient documentation

## 2016-04-15 ENCOUNTER — Encounter: Payer: Self-pay | Admitting: Sports Medicine

## 2016-04-15 ENCOUNTER — Ambulatory Visit (INDEPENDENT_AMBULATORY_CARE_PROVIDER_SITE_OTHER): Payer: Medicare Other | Admitting: Sports Medicine

## 2016-04-15 DIAGNOSIS — M79676 Pain in unspecified toe(s): Secondary | ICD-10-CM

## 2016-04-15 DIAGNOSIS — B351 Tinea unguium: Secondary | ICD-10-CM

## 2016-04-15 NOTE — Progress Notes (Signed)
Patient ID: Spencer Acosta, male   DOB: 1915-04-20, 82 y.o.   MRN: [id number] Subjective: Spencer Acosta is a 82 y.o. male patient seen today in office with complaint of painful thickened and elongated toenails; unable to trim. Patient denies changes with medical history since last encounter. Patient has no other pedal complaints at this time.   Patient Active Problem List   Diagnosis Date Noted  . Atrial fibrillation ([place]) 03/20/2016  . Colitis 02/19/2016  . Bleeding hemorrhoid 07/19/2015  . Atherosclerosis of aorta ([place]) 10/01/2014  . Crohn's disease of colon ([place]) 03/11/2014  . BP (high blood pressure) 03/11/2014  . Hypertriglyceridemia 03/11/2014  . Peripheral vascular disease ([place]) 03/11/2014  . Type 2 diabetes mellitus ([place]) 03/11/2014   Current Outpatient Prescriptions on File Prior to Visit  Medication Sig Dispense Refill  . acidophilus (RISAQUAD) CAPS capsule Take 1 capsule by mouth 2 (two) times daily. 60 capsule 0  . albuterol (PROVENTIL HFA;VENTOLIN HFA) 108 (90 Base) MCG/ACT inhaler Inhale 2 puffs into the lungs every 6 (six) hours as needed. 1 Inhaler 0  . benzonatate (TESSALON PERLES) 100 MG capsule Take 1 capsule (100 mg total) by mouth every 6 (six) hours as needed for cough. 15 capsule 0  . cholecalciferol (VITAMIN D) 1000 units tablet Take 1,000 Units by mouth daily.    [name] Kitchen latanoprost (XALATAN) 0.005 % ophthalmic solution Place 1 drop into both eyes at bedtime.    . Mesalamine (ASACOL) 400 MG CPDR DR capsule Take 2 capsules (800 mg total) by mouth 3 (three) times daily. 180 capsule 0  . metoprolol tartrate (LOPRESSOR) 25 MG tablet Take 1 tablet (25 mg total) by mouth 2 (two) times daily. 60 tablet 0  . predniSONE (DELTASONE) 20 MG tablet Take 3 tablets (60 mg total) by mouth daily. 12 tablet 0  . vancomycin (VANCOCIN) 50 mg/mL oral solution Take 5 mLs (250 mg total) by mouth every 6 (six) hours.     No current facility-administered medications on file prior to visit.    Allergies  Allergen Reactions  . Sulfa Antibiotics Nausea And Vomiting    Objective: Physical Exam  General: Well developed, nourished, no acute distress, awake, alert and oriented x 3  Vascular: Dorsalis pedis artery 1/4 bilateral, Posterior tibial artery 1/4 bilateral, skin temperature warm to warm proximal to distal bilateral lower extremities, + varicosities, scant pedal hair present bilateral.  Neurological: Gross sensation present via light touch bilateral.   Dermatological: Skin is warm, dry, and supple bilateral, Nails 1-10 are tender, long, thick, and discolored with mild subungal debris, no webspace macerations present bilateral, no open lesions present bilateral, no callus/corns/hyperkeratotic tissue present bilateral. No signs of infection bilateral.  Musculoskeletal: Asymptomatic hammertoe deformities noted bilateral. Muscular strength within normal limits without pain or limitation on range of motion. No pain with calf compression bilateral.  Assessment and Plan:  Problem List Items Addressed This Visit    None    Visit Diagnoses    Dermatophytosis of nail    -  Primary    Pain of toe, unspecified laterality          -Examined patient.  -Discussed treatment options for painful mycotic nails. -Mechanically debrided and reduced mycotic nails with sterile nail nipper and dremel nail file without incident. -Recommend continue with good supportive shoes daily.  -Patient to return in 3 months for follow up evaluation or sooner if symptoms worsen.  Landis Martins, DPM

## 2016-04-18 ENCOUNTER — Ambulatory Visit: Payer: Medicare Other | Admitting: Sports Medicine

## 2016-05-09 ENCOUNTER — Ambulatory Visit: Payer: Medicare Other | Admitting: Sports Medicine

## 2016-07-22 ENCOUNTER — Ambulatory Visit: Payer: Medicare Other | Admitting: Sports Medicine

## 2016-08-08 ENCOUNTER — Encounter: Payer: Self-pay | Admitting: Podiatry

## 2016-08-08 ENCOUNTER — Encounter: Payer: Medicare Other | Admitting: Podiatry

## 2016-08-08 NOTE — Progress Notes (Signed)
This encounter was created in error - please disregard.

## 2016-08-22 ENCOUNTER — Ambulatory Visit (INDEPENDENT_AMBULATORY_CARE_PROVIDER_SITE_OTHER): Payer: Medicare Other | Admitting: Podiatry

## 2016-08-22 DIAGNOSIS — M79609 Pain in unspecified limb: Principal | ICD-10-CM

## 2016-08-22 DIAGNOSIS — L609 Nail disorder, unspecified: Secondary | ICD-10-CM

## 2016-08-22 DIAGNOSIS — B351 Tinea unguium: Secondary | ICD-10-CM

## 2016-08-22 DIAGNOSIS — E0843 Diabetes mellitus due to underlying condition with diabetic autonomic (poly)neuropathy: Secondary | ICD-10-CM

## 2016-08-22 DIAGNOSIS — L608 Other nail disorders: Secondary | ICD-10-CM

## 2016-08-22 DIAGNOSIS — L603 Nail dystrophy: Secondary | ICD-10-CM

## 2016-08-22 DIAGNOSIS — M79676 Pain in unspecified toe(s): Secondary | ICD-10-CM

## 2016-08-25 NOTE — Progress Notes (Signed)
SUBJECTIVE Patient with a history of diabetes mellitus presents to office today complaining of elongated, thickened nails. Pain while ambulating in shoes. Patient is unable to trim their own nails.   Allergies  Allergen Reactions  . Sulfa Antibiotics Nausea And Vomiting    OBJECTIVE General Patient is awake, alert, and oriented x 3 and in no acute distress. Derm Skin is dry and supple bilateral. Negative open lesions or macerations. Remaining integument unremarkable. Nails are tender, long, thickened and dystrophic with subungual debris, consistent with onychomycosis, 1-5 bilateral. No signs of infection noted. Vasc  DP and PT pedal pulses palpable bilaterally. Temperature gradient within normal limits.  Neuro Epicritic and protective threshold sensation diminished bilaterally.  Musculoskeletal Exam No symptomatic pedal deformities noted bilateral. Muscular strength within normal limits.  ASSESSMENT 1. Diabetes Mellitus w/ peripheral neuropathy 2. Onychomycosis of nail due to dermatophyte bilateral 3. Pain in foot bilateral  PLAN OF CARE Patient evaluated today. Instructed to maintain good pedal hygiene and foot care. Stressed importance of controlling blood sugar.  Mechanical debridement of nails 1-5 bilaterally performed using a nail nipper. Filed with dremel without incident.  All patient questions were answered. Return to clinic in 3 mos.    Edrick Kins, DPM

## 2016-10-04 ENCOUNTER — Emergency Department
Admission: EM | Admit: 2016-10-04 | Discharge: 2016-10-04 | Disposition: A | Discharge status: Home / Self Care | Payer: Medicare Other | Attending: Emergency Medicine | Admitting: Emergency Medicine

## 2016-10-04 ENCOUNTER — Emergency Department: Payer: Medicare Other

## 2016-10-04 DIAGNOSIS — N189 Chronic kidney disease, unspecified: Secondary | ICD-10-CM | POA: Insufficient documentation

## 2016-10-04 DIAGNOSIS — Z87891 Personal history of nicotine dependence: Secondary | ICD-10-CM | POA: Insufficient documentation

## 2016-10-04 DIAGNOSIS — I129 Hypertensive chronic kidney disease with stage 1 through stage 4 chronic kidney disease, or unspecified chronic kidney disease: Secondary | ICD-10-CM | POA: Diagnosis not present

## 2016-10-04 DIAGNOSIS — E1122 Type 2 diabetes mellitus with diabetic chronic kidney disease: Secondary | ICD-10-CM | POA: Diagnosis not present

## 2016-10-04 DIAGNOSIS — Z79899 Other long term (current) drug therapy: Secondary | ICD-10-CM | POA: Insufficient documentation

## 2016-10-04 DIAGNOSIS — R310 Gross hematuria: Secondary | ICD-10-CM | POA: Diagnosis not present

## 2016-10-04 DIAGNOSIS — R590 Localized enlarged lymph nodes: Secondary | ICD-10-CM | POA: Diagnosis not present

## 2016-10-04 DIAGNOSIS — R195 Other fecal abnormalities: Secondary | ICD-10-CM | POA: Diagnosis present

## 2016-10-04 LAB — URINALYSIS COMPLETE WITH MICROSCOPIC (ARMC ONLY)
SPECIFIC GRAVITY, URINE: 1.018 (ref 1.005–1.030)
Squamous Epithelial / LPF: NONE SEEN

## 2016-10-04 LAB — TYPE AND SCREEN
ABO/RH(D): A POS
Antibody Screen: NEGATIVE

## 2016-10-04 LAB — CBC
HCT: 38.3 % — ABNORMAL LOW (ref 40.0–52.0)
HEMOGLOBIN: 13.4 g/dL (ref 13.0–18.0)
MCH: 32.3 pg (ref 26.0–34.0)
MCHC: 35.1 g/dL (ref 32.0–36.0)
MCV: 92.2 fL (ref 80.0–100.0)
Platelets: 209 10*3/uL (ref 150–440)
RBC: 4.16 MIL/uL — AB (ref 4.40–5.90)
RDW: 14.5 % (ref 11.5–14.5)
WBC: 8.5 10*3/uL (ref 3.8–10.6)

## 2016-10-04 LAB — COMPREHENSIVE METABOLIC PANEL
ALT: 23 U/L (ref 17–63)
ANION GAP: 8 (ref 5–15)
AST: 26 U/L (ref 15–41)
Albumin: 3.9 g/dL (ref 3.5–5.0)
Alkaline Phosphatase: 59 U/L (ref 38–126)
BUN: 50 mg/dL — ABNORMAL HIGH (ref 6–20)
CHLORIDE: 107 mmol/L (ref 101–111)
CO2: 29 mmol/L (ref 22–32)
Calcium: 9.2 mg/dL (ref 8.9–10.3)
Creatinine, Ser: 1.34 mg/dL — ABNORMAL HIGH (ref 0.61–1.24)
GFR calc non Af Amer: 42 mL/min — ABNORMAL LOW (ref >60)
GFR, EST AFRICAN AMERICAN: 49 mL/min — AB (ref >60)
Glucose, Bld: 129 mg/dL — ABNORMAL HIGH (ref 65–99)
POTASSIUM: 4 mmol/L (ref 3.5–5.1)
SODIUM: 144 mmol/L (ref 135–145)
Total Bilirubin: 1.3 mg/dL — ABNORMAL HIGH (ref 0.3–1.2)
Total Protein: 7 g/dL (ref 6.5–8.1)

## 2016-10-04 LAB — CK: CK TOTAL: 65 U/L (ref 49–397)

## 2016-10-04 MED ORDER — SODIUM CHLORIDE 0.9 % IV BOLUS (SEPSIS): 30.0000 mL/kg | Freq: Once | INTRAVENOUS | Status: DC

## 2016-10-04 NOTE — ED Triage Notes (Addendum)
Pt reports since yesterday he is having rectal bleeding for past 2 days. Some dark red and some bright red when he wipes per pt. Also c/o urinary frequency. Denies Pain Pt alert oriented x3

## 2016-10-04 NOTE — ED Provider Notes (Signed)
[facility] Emergency Department Provider Note  ____________________________________________   First MD Initiated Contact with Patient 10/04/16 1649     (approximate)  I have reviewed the triage vital signs and the nursing notes.   HISTORY  Chief Complaint GI Bleeding  Spencer Acosta is limited by the fact that the patient is severely hard of hearing but he is very sharp and alert and oriented particularly for his age  HPI Spencer Acosta is a 82 y.o. male who is relatively healthy for his age and appears much younger than his stated age and who remains active and presents for evaluation of intermittent blood in his stool over the last 2 days.  He states that he thinks it might be because he required the Heimlich maneuver several days ago, performed by a friend after the patient choked on some food.  He has no abdominal pain or chest pain, but he states that he has noticed some blood on the toilet paper when he wipes after a bowel movement.  There is no gross blood present in the commode. He denies fever/chills, chest pain, shortness of breath, nausea, vomiting, diarrhea, constipation.  He has no history of hemorrhoids of which she is aware.  Little history states that he has a history of Crohn's disease and take mesalamine and prednisone.  He lives at [facility].  He describes the symptoms as mild and nothing makes them better nor worse again reiterates that he has no pain.   Past Medical History:  Diagnosis Date  . A-fib ([place])   . CKD (chronic kidney disease)   . Crohn's disease ([place])   . Diabetes mellitus without complication ([place])   . Hypertension   . Hypertriglyceridemia 03/11/2014  . PVD (peripheral vascular disease) [facility])     Patient Active Problem List   Diagnosis Date Noted  . Atrial fibrillation ([place]) 03/20/2016  . Colitis 02/19/2016  . Bleeding hemorrhoid 07/19/2015  . Atherosclerosis of aorta ([place]) 10/01/2014  . Crohn's disease  of colon ([place]) 03/11/2014  . BP (high blood pressure) 03/11/2014  . Hypertriglyceridemia 03/11/2014  . Peripheral vascular disease ([place]) 03/11/2014  . Type 2 diabetes mellitus ([place]) 03/11/2014    Past Surgical History:  Procedure Laterality Date  . APPENDECTOMY    . TURP VAPORIZATION      Prior to Admission medications   Medication Sig Start Date End Date Taking? Authorizing Provider  acidophilus (RISAQUAD) CAPS capsule Take 1 capsule by mouth 2 (two) times daily. 02/22/16   Loletha Grayer, MD  albuterol (PROVENTIL HFA;VENTOLIN HFA) 108 (90 Base) MCG/ACT inhaler Inhale 2 puffs into the lungs every 6 (six) hours as needed. 03/17/16   Loney Hering, MD  cholecalciferol (VITAMIN D) 1000 units tablet Take 1,000 Units by mouth daily.    Historical Provider, MD  hydrochlorothiazide (HYDRODIURIL) 12.5 MG tablet Take by mouth. 06/09/16   Historical Provider, MD  latanoprost (XALATAN) 0.005 % ophthalmic solution Place 1 drop into both eyes at bedtime.    Historical Provider, MD  Mesalamine (ASACOL) 400 MG CPDR DR capsule Take 2 capsules (800 mg total) by mouth 3 (three) times daily. 02/22/16   Loletha Grayer, MD  metoprolol tartrate (LOPRESSOR) 25 MG tablet Take 1 tablet (25 mg total) by mouth 2 (two) times daily. 02/22/16   Loletha Grayer, MD  predniSONE (DELTASONE) 20 MG tablet Take 3 tablets (60 mg total) by mouth daily. 03/17/16   Loney Hering, MD    Allergies Sulfa antibiotics  No family  history on file.  Social History Social History  Substance Use Topics  . Smoking status: Former Research scientist (life sciences)  . Smokeless tobacco: Never Used     Comment: quit 70 years ago  . Alcohol use No     Comment: occasional    Review of Systems Constitutional: No fever/chills Eyes: No visual changes. ENT: No sore throat. Cardiovascular: Denies chest pain. Respiratory: Denies shortness of breath. Gastrointestinal: No abdominal pain.  No nausea, no vomiting.  No diarrhea.  No  constipation. Genitourinary: Negative for dysuria. Musculoskeletal: Negative for back pain. Skin: Negative for rash. Neurological: Negative for headaches, focal weakness or numbness.  10-point ROS otherwise negative.  ____________________________________________   PHYSICAL EXAM:  VITAL SIGNS: ED Triage Vitals  Enc Vitals Group     BP 10/04/16 1431 125/71     Pulse Rate 10/04/16 1431 81     Resp 10/04/16 1431 18     Temp 10/04/16 1431 97.4 F (36.3 C)     Temp Source 10/04/16 1431 Oral     SpO2 10/04/16 1431 100 %     Weight 10/04/16 1432 164 lb (74.4 kg)     Height 10/04/16 1432 5\' 8"  (1.727 m)     Head Circumference --      Peak Flow --      Pain Score --      Pain Loc --      Pain Edu? --      Excl. in [place]? --     Constitutional: Alert and oriented. Well appearing and in no acute distress.  Appears much younger than chronological age. Eyes: Conjunctivae are normal. PERRL. EOMI. Head: Atraumatic. Nose: No congestion/rhinnorhea. Mouth/Throat: Mucous membranes are moist.  Oropharynx non-erythematous. Neck: No stridor.  No meningeal signs.   Cardiovascular: Normal rate, regular rhythm. Good peripheral circulation. Grossly normal heart sounds. Respiratory: Normal respiratory effort.  No retractions. Lungs CTAB. Gastrointestinal: Soft and nontender. No distention.  Rectal:  Light brown stool present in rectal vault, no gross blood.  Hemoccult negative, quality control passed, chaperone present Musculoskeletal: No lower extremity tenderness nor edema. No gross deformities of extremities. Neurologic:  Normal speech and language. No gross focal neurologic deficits are appreciated except severely HOH Skin:  Skin is warm, dry and intact. No rash noted. Psychiatric: Mood and affect are normal. Speech and behavior are normal.  ____________________________________________   LABS (all labs ordered are listed, but only abnormal results are displayed)  Labs Reviewed   COMPREHENSIVE METABOLIC PANEL - Abnormal; Notable for the following:       Result Value   Glucose, Bld 129 (*)    BUN 50 (*)    Creatinine, Ser 1.34 (*)    Total Bilirubin 1.3 (*)    GFR calc non Af Amer 42 (*)    GFR calc Af Amer 49 (*)    All other components within normal limits  CBC - Abnormal; Notable for the following:    RBC 4.16 (*)    HCT 38.3 (*)    All other components within normal limits  URINALYSIS COMPLETEWITH MICROSCOPIC ([facility] ONLY) - Abnormal; Notable for the following:    Color, Urine RED (*)    APPearance TURBID (*)    Glucose, UA   (*)    Value: TEST NOT REPORTED DUE TO COLOR INTERFERENCE OF URINE PIGMENT   Bilirubin Urine   (*)    Value: TEST NOT REPORTED DUE TO COLOR INTERFERENCE OF URINE PIGMENT   Ketones, ur   (*)    Value:  TEST NOT REPORTED DUE TO COLOR INTERFERENCE OF URINE PIGMENT   Hgb urine dipstick   (*)    Value: TEST NOT REPORTED DUE TO COLOR INTERFERENCE OF URINE PIGMENT   Protein, ur   (*)    Value: TEST NOT REPORTED DUE TO COLOR INTERFERENCE OF URINE PIGMENT   Nitrite   (*)    Value: TEST NOT REPORTED DUE TO COLOR INTERFERENCE OF URINE PIGMENT   Leukocytes, UA   (*)    Value: TEST NOT REPORTED DUE TO COLOR INTERFERENCE OF URINE PIGMENT   Bacteria, UA MANY (*)    All other components within normal limits  URINE CULTURE  CK  POC OCCULT BLOOD, ED  TYPE AND SCREEN   ____________________________________________  EKG  None - EKG not ordered by ED physician ____________________________________________  RADIOLOGY   Ct Renal Stone Study  Result Date: 10/04/2016 CLINICAL DATA:  82 year old male with a history of rectal bleeding for 2 days EXAM: CT ABDOMEN AND PELVIS WITHOUT CONTRAST TECHNIQUE: Multidetector CT imaging of the abdomen and pelvis was performed following the standard protocol without IV contrast. COMPARISON:  02/19/2016 FINDINGS: Lower chest: Small right pleural effusion with associated atelectasis. No pericardial fluid/  thickening. Calcifications of native coronary vasculature. Small hiatal hernia. Hepatobiliary: Hypodense lesion of the right liver lobe, unchanged from prior. Cholelithiasis. No associated inflammatory changes. Pancreas: No peripancreatic fluid or inflammatory changes. Spleen: Unremarkable appearance of the spleen. Adrenals/Urinary Tract: Unremarkable appearance the bilateral adrenal glands. Low-density cystic structures of the bilateral kidneys, incompletely characterized. Unremarkable course of the bilateral ureters. No evidence of left or right-sided hydronephrosis. Diameter of the distal right ureter is unchanged from the prior with no radiopaque stone. Stomach/Bowel: No transition point. No evidence of obstruction. No inflammatory changes adjacent to the small bowel. Moderate stool burden. Diverticular disease without associated inflammatory changes. No fluid levels of the colon. Coarse calcifications of the appendix which is otherwise unremarkable. Vascular/Lymphatic: Calcifications of the abdominal aorta. No aneurysm. Calcifications extend to the bilateral iliac systems. No periaortic fluid. Calcifications involve the origin the mesenteric vessels and the bilateral renal arteries. In the interval there has been development of significant adenopathy of the right inguinal region, right iliac nodes, extending through the nodal stations of the right para-aortic nodes to the level of the renal hila. Greatest diameter of the index node adjacent to the right kidney measures 1.9 cm in short axis diameter. Lymph node of the right iliac nodal station measures 3.5 cm by 7.5 cm. Nodal mass in the right inguinal region measures 5.2 cm by 7.0 cm. Reproductive: Prostatectomy Other: No hernia. Musculoskeletal: No displaced fractures. Multilevel degenerative changes of the visualized spine. No bony canal narrowing. Mild degenerative changes of the hips. IMPRESSION: Interval development of significant adenopathy of the right  inguinal region, right iliac nodal stations, and the right-sided periaortic lymph nodes. Findings may reflect lymphoma, however, metastatic disease from lower extremity melanoma or sarcoma should also be considered. These results were called by telephone at the time of interpretation on 10/04/2016 at 7:53 pm to Dr. Hinda Kehr , who verbally acknowledged these results. No CT correlation to account for the patient's symptoms of GI bleeding. Diverticular disease without evidence of acute diverticulitis. Aortic atherosclerosis with associated vascular disease of the bilateral iliac system. Cholelithiasis. Bilateral low-density cystic lesions of the kidneys, which are most likely benign though incompletely characterized on the noncontrast CT. Signed, Dulcy Fanny. Earleen Newport, DO [facility] Radiology Electronically Signed   By: Corrie Mckusick  D.O.   On: 10/04/2016 20:04    ____________________________________________   PROCEDURES  Procedure(s) performed:   Procedures   Critical Care performed: No ____________________________________________   INITIAL IMPRESSION / ASSESSMENT AND PLAN / ED COURSE  Pertinent labs & imaging results that were available during my care of the patient were reviewed by me and considered in my medical decision making (see chart for details).  The patient has a normal hemoglobin, his metabolic panel is unremarkable for his age, he is cheerful and in good spirits with no acute distress.  He has no gross blood in his stool and the stool is Hemoccult negative.  He has no abdominal tenderness to palpation and no evidence of any traumatic injury from the Heimlich maneuver.  There is no indication for further imaging.  He may have a very small intermittent bleed but he has not even trace blood in his stool at this point I believe he is appropriate for outpatient follow-up.   However, as I was discharging him, he provided a urine specimen  that was gross hematuria.  We held the discharge and I sent to the lab and it was so much hematuria that they cannot really adequately processed sample although there does not seem to be any signs of acute infection and a urine culture is pending.  After finding that result I sent him for a CT renal study protocol to rule out stones or any other acute obvious kidney changes.  The radiologist called and discussed the case with me.  He stated that there appeared to be bilateral benign cysts on the kidneys but nothing remarkable in that regard.  However he was very concerned about very substantial right inguinal lymphadenopathy that extends up into the abdomen.  He thinks it most likely represents a metastatic process.  However the patient again is in no acute distress and has normal vital signs and essentially normal labs except for the urinalysis.  I talked with the patient again and explained the findings to the best of my ability and encouraged him to follow up with oncology and urology on Monday morning.  I also pointed out that his primary care doctor, Dr. Ouida Sills, may be able to help him with some of this evaluation.  I provided the names and numbers for the doctors to call and encouraged him to ask the people at his living facility to help him.  The patient is very comfortable at this time and does not want to stay in the hospital even if there was an admittable diagnosis.  He understands the need for outpatient follow-up.   ____________________________________________  FINAL CLINICAL IMPRESSION(S) / ED DIAGNOSES  Final diagnoses:  Gross hematuria  Inguinal lymphadenopathy     MEDICATIONS GIVEN DURING THIS VISIT:  Medications - No data to display   NEW OUTPATIENT MEDICATIONS STARTED DURING THIS VISIT:  New Prescriptions   No medications on file    Modified Medications   No medications on file    Discontinued Medications   No medications on file     Note:  This document was  prepared using Dragon voice recognition software and may include unintentional dictation errors.    Hinda Kehr, MD 10/04/16 2043

## 2016-10-04 NOTE — ED Notes (Signed)
[facility] will be contacted for ambulance ride home.

## 2016-10-04 NOTE — ED Notes (Signed)
Patient is back from CT scan.

## 2016-10-04 NOTE — ED Notes (Signed)
Monitor atrial fibrillation per history.

## 2016-10-04 NOTE — Discharge Instructions (Addendum)
As we discussed, there was no evidence of blood in your stool today, and your lab work all looked good, but you do have a large amount of blood in your urine.  Additionally, your CT scan showed that you have a large number of very large lymph nodes throughout your abdomen and down into your right leg.  This is concerning for possible cancer.  Though there is nothing to do today about either of these issues, we recommend you contact Dr. Erlene Quan or one of her colleagues at [facility] to schedule an appoint to discuss your hematuria, as well as calling the [facility] and scheduling an appointment with Dr. Grayland Ormond or one of his colleagues.  Dr. Ouida Sills may also help you arrange some of this follow up.  Return to the emergency department if you develop new or worsening symptoms that concern you.

## 2016-10-04 NOTE — ED Notes (Signed)
Called pt. Neighbor Mack Guise [phone], stated she could not pick up patient because they do not drive after dark.  Stated other neighbor Kevan Ny could not pick up patient because he is feeling sick today.

## 2016-10-04 NOTE — ED Notes (Signed)
Patient's neighbor Spencer Acosta arrived to take the patient home. Mr. Spencer Acosta stated that he always helps the patient at his home when questioned if he was able to help the patient inside. The patient's daughter called and was told that the neighbor was here to take the patient home and she was fine with that solution. A medical necessity form had been filled out so the patient could go home by EMS, but patient preferred to go home with the neighbor. [facility] was called and informed that the patient was being discharged home with neighbor Mr. Spencer Acosta who requested that we call [facility] to let them know they were on their way.

## 2016-10-07 LAB — URINE CULTURE
Culture: >=100000 — AB
Special Requests: NORMAL

## 2016-10-10 ENCOUNTER — Encounter: Payer: Self-pay | Admitting: Urology

## 2016-10-10 ENCOUNTER — Other Ambulatory Visit: Payer: Self-pay | Admitting: Radiology

## 2016-10-10 ENCOUNTER — Ambulatory Visit (INDEPENDENT_AMBULATORY_CARE_PROVIDER_SITE_OTHER): Payer: Medicare Other | Admitting: Urology

## 2016-10-10 VITALS — BP 150/80 | HR 60 | Ht 68.0 in | Wt 173.0 lb

## 2016-10-10 DIAGNOSIS — R310 Gross hematuria: Secondary | ICD-10-CM | POA: Diagnosis not present

## 2016-10-10 DIAGNOSIS — R590 Localized enlarged lymph nodes: Secondary | ICD-10-CM

## 2016-10-10 MED ORDER — AMPICILLIN 500 MG PO CAPS: 500.0000 mg | ORAL_CAPSULE | Freq: Three times a day (TID) | ORAL | 0 refills | Status: DC

## 2016-10-10 NOTE — Progress Notes (Signed)
10/10/2016 1:57 PM   B and E 1915-04-21 [id number]  Referring provider: Kirk Ruths, MD [facility] [place], [place] [address]  Chief Complaint  Patient presents with  . Follow-up    hospital stay follow up hematuria    HPI: Spencer Acosta is a 82yo BPH with a hx of TURP who was seen in the ER for gross hematuria. He underwent CT scan which showed bulky right inguinal and pelvic lymphadenopathy. He denies any pain, no significant LUTS. No hx of UTIs. NO hx of nephrolithiasis.  Urine culture from the ER showed Enterococcus. He was not treated with any antibiotic.  No hx of radiation.   PMH: Past Medical History:  Diagnosis Date  . A-fib ([place])   . CKD (chronic kidney disease)   . Crohn's disease ([place])   . Diabetes mellitus without complication ([place])   . Hypertension   . Hypertriglyceridemia 03/11/2014  . PVD (peripheral vascular disease) [facility])     Surgical History: Past Surgical History:  Procedure Laterality Date  . APPENDECTOMY    . TURP VAPORIZATION      Home Medications:    Medication List       Accurate as of 10/10/16  1:57 PM. Always use your most recent med list.          acidophilus Caps capsule Take 1 capsule by mouth 2 (two) times daily.   albuterol 108 (90 Base) MCG/ACT inhaler Commonly known as:  PROVENTIL HFA;VENTOLIN HFA Inhale 2 puffs into the lungs every 6 (six) hours as needed.   cholecalciferol 1000 units tablet Commonly known as:  VITAMIN D Take 1,000 Units by mouth daily.   hydrochlorothiazide 12.5 MG tablet Commonly known as:  HYDRODIURIL Take by mouth.   latanoprost 0.005 % ophthalmic solution Commonly known as:  XALATAN Place 1 drop into both eyes at bedtime.   Mesalamine 400 MG Cpdr DR capsule Commonly known as:  ASACOL Take 2 capsules (800 mg total) by mouth 3 (three) times daily.   metoprolol tartrate 25 MG tablet Commonly known as:  LOPRESSOR Take 1 tablet (25 mg total) by mouth  2 (two) times daily.   predniSONE 20 MG tablet Commonly known as:  DELTASONE Take 3 tablets (60 mg total) by mouth daily.       Allergies:  Allergies  Allergen Reactions  . Sulfa Antibiotics Nausea And Vomiting    Family History: No family history on file.  Social History:  reports that he has quit smoking. He has never used smokeless tobacco. He reports that he does not drink alcohol or use drugs.  ROS: UROLOGY Frequent Urination?: No Hard to postpone urination?: No Burning/pain with urination?: No Get up at night to urinate?: No Leakage of urine?: No Urine stream starts and stops?: No Trouble starting stream?: No Do you have to strain to urinate?: No Blood in urine?: Yes Urinary tract infection?: No Sexually transmitted disease?: No Injury to kidneys or bladder?: No Painful intercourse?: No Weak stream?: No Erection problems?: No Penile pain?: No  Gastrointestinal Nausea?: No Vomiting?: Yes Indigestion/heartburn?: No Diarrhea?: No Constipation?: No  Constitutional Fever: No Night sweats?: No Weight loss?: No Fatigue?: No  Skin Skin rash/lesions?: No Itching?: No  Eyes Blurred vision?: No Double vision?: No  Ears/Nose/Throat Sore throat?: No Sinus problems?: No  Hematologic/Lymphatic Swollen glands?: No Easy bruising?: No  Cardiovascular Leg swelling?: No Chest pain?: No  Respiratory Cough?: No Shortness of breath?: No  Endocrine Excessive thirst?: No  Musculoskeletal Back pain?: No Joint pain?: No  Neurological Headaches?: No Dizziness?: No  Psychologic Depression?: No Anxiety?: No  Physical Exam: BP (!) 150/80   Pulse 60   Ht 5\' 8"  (1.727 m)   Wt 78.5 kg (173 lb)   BMI 26.30 kg/m   Constitutional:  Alert and oriented, No acute distress. HEENT: [place] AT, moist mucus membranes.  Trachea midline, no masses. Cardiovascular: No clubbing, cyanosis, or edema. Respiratory: Normal respiratory effort, no increased work of  breathing. GI: Abdomen is soft, nontender, nondistended, no abdominal masses GU: No CVA tenderness. Uncircumicised. No obvious penile lesions/scortal lesions. Firm fixed right inguinal lymph nodes,  Skin: No rashes, bruises or suspicious lesions. Lymph: No cervical or inguinal adenopathy. Neurologic: Grossly intact, no focal deficits, moving all 4 extremities. Psychiatric: Normal mood and affect.  Laboratory Data: Lab Results  Component Value Date   WBC 8.5 10/04/2016   HGB 13.4 10/04/2016   HCT 38.3 (L) 10/04/2016   MCV 92.2 10/04/2016   PLT 209 10/04/2016    Lab Results  Component Value Date   CREATININE 1.34 (H) 10/04/2016    No results found for: PSA  No results found for: TESTOSTERONE  No results found for: HGBA1C  Urinalysis    Component Value Date/Time   COLORURINE RED (A) 10/04/2016 1802   APPEARANCEUR TURBID (A) 10/04/2016 1802   LABSPEC 1.018 10/04/2016 1802   PHURINE  10/04/2016 1802    TEST NOT REPORTED DUE TO COLOR INTERFERENCE OF URINE PIGMENT   GLUCOSEU (A) 10/04/2016 1802    TEST NOT REPORTED DUE TO COLOR INTERFERENCE OF URINE PIGMENT   HGBUR (A) 10/04/2016 1802    TEST NOT REPORTED DUE TO COLOR INTERFERENCE OF URINE PIGMENT   BILIRUBINUR (A) 10/04/2016 1802    TEST NOT REPORTED DUE TO COLOR INTERFERENCE OF URINE PIGMENT   KETONESUR (A) 10/04/2016 1802    TEST NOT REPORTED DUE TO COLOR INTERFERENCE OF URINE PIGMENT   PROTEINUR (A) 10/04/2016 1802    TEST NOT REPORTED DUE TO COLOR INTERFERENCE OF URINE PIGMENT   NITRITE (A) 10/04/2016 1802    TEST NOT REPORTED DUE TO COLOR INTERFERENCE OF URINE PIGMENT   LEUKOCYTESUR (A) 10/04/2016 1802    TEST NOT REPORTED DUE TO COLOR INTERFERENCE OF URINE PIGMENT    Pertinent Imaging: CT stone study  Assessment & Plan:   1. Gross hematuria: likely related to UTI. CT urogram showed no obvious tumors in the GU tract. Pt has significant lymphadenopathy  -ampicillin 500mg  BID for 14 days 2. Inguinal or pelvic  lymphadenapthy: -schedule for biopsy  RTC 3-4 weeks  There are no diagnoses linked to this encounter.  No Follow-up on file.  Nicolette Bang, MD  [facility] [address] [place], [place] [address] [phone]

## 2016-10-13 ENCOUNTER — Telehealth: Payer: Self-pay | Admitting: Radiology

## 2016-10-13 NOTE — Telephone Encounter (Signed)
Spoke with Heritage manager, Education officer, museum at [facility], regarding right inguinal lymph node biopsy scheduled on 10/21/16 with arrival time to [facility] registration desk at 8:00, pt to be npo after mn except to take metoprolol with a sip of water. Notified Sharice of follow-up appt with Dr Pilar Jarvis on 10/31/16 @2 :35. Sharice voices understanding.

## 2016-10-20 ENCOUNTER — Other Ambulatory Visit: Payer: Self-pay | Admitting: Radiology

## 2016-10-21 ENCOUNTER — Ambulatory Visit
Admission: RE | Admit: 2016-10-21 | Discharge: 2016-10-21 | Disposition: A | Discharge status: Home / Self Care | Payer: Medicare Other | Source: Ambulatory Visit | Attending: Urology | Admitting: Urology

## 2016-10-21 DIAGNOSIS — K5090 Crohn's disease, unspecified, without complications: Secondary | ICD-10-CM | POA: Diagnosis not present

## 2016-10-21 DIAGNOSIS — N189 Chronic kidney disease, unspecified: Secondary | ICD-10-CM | POA: Diagnosis not present

## 2016-10-21 DIAGNOSIS — I129 Hypertensive chronic kidney disease with stage 1 through stage 4 chronic kidney disease, or unspecified chronic kidney disease: Secondary | ICD-10-CM | POA: Diagnosis not present

## 2016-10-21 DIAGNOSIS — E1151 Type 2 diabetes mellitus with diabetic peripheral angiopathy without gangrene: Secondary | ICD-10-CM | POA: Diagnosis not present

## 2016-10-21 DIAGNOSIS — E1122 Type 2 diabetes mellitus with diabetic chronic kidney disease: Secondary | ICD-10-CM | POA: Diagnosis not present

## 2016-10-21 DIAGNOSIS — I4891 Unspecified atrial fibrillation: Secondary | ICD-10-CM | POA: Diagnosis not present

## 2016-10-21 DIAGNOSIS — E781 Pure hyperglyceridemia: Secondary | ICD-10-CM | POA: Insufficient documentation

## 2016-10-21 DIAGNOSIS — C8515 Unspecified B-cell lymphoma, lymph nodes of inguinal region and lower limb: Secondary | ICD-10-CM | POA: Diagnosis not present

## 2016-10-21 DIAGNOSIS — Z87891 Personal history of nicotine dependence: Secondary | ICD-10-CM | POA: Diagnosis not present

## 2016-10-21 DIAGNOSIS — R590 Localized enlarged lymph nodes: Secondary | ICD-10-CM | POA: Insufficient documentation

## 2016-10-21 DIAGNOSIS — C8513 Unspecified B-cell lymphoma, intra-abdominal lymph nodes: Secondary | ICD-10-CM | POA: Insufficient documentation

## 2016-10-21 HISTORY — DX: Cardiac arrhythmia, unspecified: I49.9

## 2016-10-21 MED ORDER — FENTANYL CITRATE (PF) 100 MCG/2ML IJ SOLN
INTRAMUSCULAR | Status: AC
  Filled 2016-10-21: qty 2

## 2016-10-21 MED ORDER — MIDAZOLAM HCL 5 MG/5ML IJ SOLN
INTRAMUSCULAR | Status: AC
  Filled 2016-10-21: qty 5

## 2016-10-21 MED ORDER — SODIUM CHLORIDE 0.9 % IV SOLN
INTRAVENOUS | Status: DC
  Administered 2016-10-21: 09:00:00 via INTRAVENOUS

## 2016-10-21 MED ORDER — FENTANYL CITRATE (PF) 100 MCG/2ML IJ SOLN
INTRAMUSCULAR | Status: AC | PRN
  Administered 2016-10-21: 25 ug via INTRAVENOUS

## 2016-10-21 MED ORDER — MIDAZOLAM HCL 5 MG/5ML IJ SOLN
INTRAMUSCULAR | Status: AC | PRN
  Administered 2016-10-21: 0.5 mg via INTRAVENOUS

## 2016-10-21 NOTE — H&P (Signed)
Chief Complaint: Patient was seen in consultation today for right inguinal lymph node biopsy at the request of [place]  Referring Physician(s): [place] L  Patient Status: [facility] - Out-pt  History of Present Illness: Spencer Acosta is a 82 y.o. male with right inguinal pelvic, and retroperitoneal lymphadenopathy by CT.  Presenting for US guided right inguinal LN core biopsy.  Past Medical History:  Diagnosis Date  . A-fib ([place])   . CKD (chronic kidney disease)   . Crohn's disease ([place])   . Diabetes mellitus without complication ([place])   . Dysrhythmia   . Hypertension   . Hypertriglyceridemia 03/11/2014  . PVD (peripheral vascular disease) ([place])     Past Surgical History:  Procedure Laterality Date  . APPENDECTOMY    . TURP VAPORIZATION      Allergies: Sulfa antibiotics  Medications: Prior to Admission medications   Medication Sig Start Date End Date Taking? Authorizing Provider  ampicillin (PRINCIPEN) 500 MG capsule Take 1 capsule (500 mg total) by mouth 3 (three) times daily. 10/10/16  Yes Cleon Gustin, MD  cholecalciferol (VITAMIN D) 1000 units tablet Take 1,000 Units by mouth daily.   Yes Historical Provider, MD  hydrochlorothiazide (HYDRODIURIL) 12.5 MG tablet Take by mouth. 06/09/16  Yes Historical Provider, MD  latanoprost (XALATAN) 0.005 % ophthalmic solution Place 1 drop into both eyes at bedtime.   Yes Historical Provider, MD  metoprolol tartrate (LOPRESSOR) 25 MG tablet Take 1 tablet (25 mg total) by mouth 2 (two) times daily. 02/22/16  Yes Richard Leslye Peer, MD  acidophilus (RISAQUAD) CAPS capsule Take 1 capsule by mouth 2 (two) times daily. Patient not taking: Reported on 10/21/2016 02/22/16   Loletha Grayer, MD  albuterol (PROVENTIL HFA;VENTOLIN HFA) 108 (90 Base) MCG/ACT inhaler Inhale 2 puffs into the lungs every 6 (six) hours as needed. Patient not taking: Reported on 10/21/2016 03/17/16   Loney Hering, MD  Mesalamine (ASACOL) 400  MG CPDR DR capsule Take 2 capsules (800 mg total) by mouth 3 (three) times daily. Patient not taking: Reported on 10/21/2016 02/22/16   Loletha Grayer, MD  predniSONE (DELTASONE) 20 MG tablet Take 3 tablets (60 mg total) by mouth daily. Patient not taking: Reported on 10/21/2016 03/17/16   Loney Hering, MD     History reviewed. No pertinent family history.  Social History   Social History  . Marital status: Widowed    Spouse name: N/A  . Number of children: N/A  . Years of education: N/A   Social History Main Topics  . Smoking status: Former Smoker    Quit date: 10/22/1947  . Smokeless tobacco: Never Used     Comment: quit 70 years ago  . Alcohol use No     Comment: occasional  . Drug use: No  . Sexual activity: Not Asked   Other Topics Concern  . None   Social History Narrative  . None    ECOG Status: 0 - Asymptomatic  Review of Systems: A 12 point ROS discussed and pertinent positives are indicated in the HPI above.  All other systems are negative.  Review of Systems  Constitutional: Negative.   Respiratory: Negative.   Cardiovascular: Negative.   Gastrointestinal: Negative.   Genitourinary: Negative.   Musculoskeletal: Negative.   Neurological: Negative.   Hematological: Negative.     Vital Signs: BP 126/62   Pulse 77   Temp 98.1 F (36.7 C) (Oral)   Resp (!) 24   SpO2 97%   Physical Exam  Constitutional: He is oriented to person, place, and time. He appears well-developed and well-nourished. No distress.  HENT:  Head: Normocephalic and atraumatic.  Mouth/Throat: Oropharynx is clear and moist.  Neck: Neck supple. No JVD present. No tracheal deviation present. No thyromegaly present.  Cardiovascular: Normal rate, regular rhythm and normal heart sounds.  Exam reveals no gallop and no friction rub.   No murmur heard. Pulmonary/Chest: Effort normal and breath sounds normal. No stridor. No respiratory distress. He has no wheezes. He has no rales.    Abdominal: Soft. Bowel sounds are normal. He exhibits no distension and no mass. There is no tenderness. There is no rebound and no guarding.  Musculoskeletal: He exhibits no edema.  Lymphadenopathy:    He has no cervical adenopathy.  Neurological: He is alert and oriented to person, place, and time.  Skin: He is not diaphoretic.  Vitals reviewed.   Mallampati Score:  MD Evaluation Airway: WNL Heart: WNL Abdomen: WNL Chest/ Lungs: WNL ASA  Classification: 2 Mallampati/Airway Score: One  Imaging: Ct Renal Stone Study  Result Date: 10/04/2016 CLINICAL DATA:  82 year old male with a history of rectal bleeding for 2 days EXAM: CT ABDOMEN AND PELVIS WITHOUT CONTRAST TECHNIQUE: Multidetector CT imaging of the abdomen and pelvis was performed following the standard protocol without IV contrast. COMPARISON:  02/19/2016 FINDINGS: Lower chest: Small right pleural effusion with associated atelectasis. No pericardial fluid/ thickening. Calcifications of native coronary vasculature. Small hiatal hernia. Hepatobiliary: Hypodense lesion of the right liver lobe, unchanged from prior. Cholelithiasis. No associated inflammatory changes. Pancreas: No peripancreatic fluid or inflammatory changes. Spleen: Unremarkable appearance of the spleen. Adrenals/Urinary Tract: Unremarkable appearance the bilateral adrenal glands. Low-density cystic structures of the bilateral kidneys, incompletely characterized. Unremarkable course of the bilateral ureters. No evidence of left or right-sided hydronephrosis. Diameter of the distal right ureter is unchanged from the prior with no radiopaque stone. Stomach/Bowel: No transition point. No evidence of obstruction. No inflammatory changes adjacent to the small bowel. Moderate stool burden. Diverticular disease without associated inflammatory changes. No fluid levels of the colon. Coarse calcifications of the appendix which is otherwise unremarkable. Vascular/Lymphatic:  Calcifications of the abdominal aorta. No aneurysm. Calcifications extend to the bilateral iliac systems. No periaortic fluid. Calcifications involve the origin the mesenteric vessels and the bilateral renal arteries. In the interval there has been development of significant adenopathy of the right inguinal region, right iliac nodes, extending through the nodal stations of the right para-aortic nodes to the level of the renal hila. Greatest diameter of the index node adjacent to the right kidney measures 1.9 cm in short axis diameter. Lymph node of the right iliac nodal station measures 3.5 cm by 7.5 cm. Nodal mass in the right inguinal region measures 5.2 cm by 7.0 cm. Reproductive: Prostatectomy Other: No hernia. Musculoskeletal: No displaced fractures. Multilevel degenerative changes of the visualized spine. No bony canal narrowing. Mild degenerative changes of the hips. IMPRESSION: Interval development of significant adenopathy of the right inguinal region, right iliac nodal stations, and the right-sided periaortic lymph nodes. Findings may reflect lymphoma, however, metastatic disease from lower extremity melanoma or sarcoma should also be considered. These results were called by telephone at the time of interpretation on 10/04/2016 at 7:53 pm to Dr. Hinda Kehr , who verbally acknowledged these results. No CT correlation to account for the patient's symptoms of GI bleeding. Diverticular disease without evidence of acute diverticulitis. Aortic atherosclerosis with associated vascular disease of the bilateral iliac system. Cholelithiasis. Bilateral low-density cystic lesions of  the kidneys, which are most likely benign though incompletely characterized on the noncontrast CT. Signed, Dulcy Fanny. Earleen Newport, DO [facility] Radiology Electronically Signed   By: Corrie Mckusick D.O.   On: 10/04/2016 20:04    Labs:  CBC:  Recent Labs  02/19/16 1117  02/20/16 0020  02/20/16 0446 02/20/16 1543 10/04/16 1442  WBC 12.1*  --   --  12.4*  --  8.5  HGB 12.2*  < > 11.5* 11.0* 12.1* 13.4  HCT 36.4*  --   --  32.8*  --  38.3*  PLT 230  --   --  194  --  209  < > = values in this interval not displayed.  COAGS:  Recent Labs  02/19/16 1117  INR 1.38    BMP:  Recent Labs  02/19/16 1117 02/20/16 0446 02/21/16 0442 10/04/16 1442  NA 139 141 140 144  K 2.5* 3.3* 4.0 4.0  CL 104 111 112* 107  CO2 26 25 26 29   GLUCOSE 155* 98 128* 129*  BUN 40* 33* 25* 50*  CALCIUM 7.7* 7.5* 7.3* 9.2  CREATININE 1.61* 1.27* 1.15 1.34*  GFRNONAA 34* 45* 51* 42*  GFRAA 39* 52* 59* 49*    LIVER FUNCTION TESTS:  Recent Labs  02/19/16 1117 10/04/16 1442  BILITOT 1.0 1.3*  AST 18 26  ALT 12* 23  ALKPHOS 55 59  PROT 6.0* 7.0  ALBUMIN 3.1* 3.9    TUMOR MARKERS: No results for input(s): AFPTM, CEA, CA199, CHROMGRNA in the last 8760 hours.  Assessment and Plan:  Large right inguinal lymph node mass by CT.  For US guided core biopsy today.  Consent obtained.  Thank you for this interesting consult.  I greatly enjoyed meeting CURTEZ HUBLEY and look forward to participating in their care.  A copy of this report was sent to the requesting provider on this date.  Electronically SignedAletta Edouard T 10/21/2016, 9:16 AM   I spent a total of 30 Minutes  in face to face in clinical consultation, greater than 50% of which was counseling/coordinating care for right inguinal lymph node biopsy.

## 2016-10-21 NOTE — Procedures (Signed)
Interventional Radiology Procedure Note  Procedure:  US guided core biopsy of right inguinal lymph node  Complications: None  Estimated Blood Loss: < 10 mL  16 G core bx x 5 of enlarged right lower inguinal lymph node.  Spencer Acosta. Kathlene Cote, M.D Pager:  [phone]

## 2016-10-31 ENCOUNTER — Ambulatory Visit: Payer: Medicare Other | Admitting: Urology

## 2016-11-04 ENCOUNTER — Emergency Department: Payer: Medicare Other

## 2016-11-04 ENCOUNTER — Encounter: Payer: Self-pay | Admitting: Urology

## 2016-11-04 ENCOUNTER — Encounter: Payer: Self-pay | Admitting: Emergency Medicine

## 2016-11-04 ENCOUNTER — Ambulatory Visit (INDEPENDENT_AMBULATORY_CARE_PROVIDER_SITE_OTHER): Payer: Medicare Other | Admitting: Urology

## 2016-11-04 ENCOUNTER — Inpatient Hospital Stay
Admission: EM | Admit: 2016-11-04 | Discharge: 2016-11-06 | DRG: 300 | Disposition: A | Discharge status: Home / Self Care | Payer: Medicare Other | Attending: Internal Medicine | Admitting: Internal Medicine

## 2016-11-04 VITALS — BP 136/79 | HR 92 | Ht 68.0 in | Wt 178.6 lb

## 2016-11-04 DIAGNOSIS — C8590 Non-Hodgkin lymphoma, unspecified, unspecified site: Secondary | ICD-10-CM | POA: Diagnosis present

## 2016-11-04 DIAGNOSIS — C8510 Unspecified B-cell lymphoma, unspecified site: Secondary | ICD-10-CM | POA: Diagnosis present

## 2016-11-04 DIAGNOSIS — I4891 Unspecified atrial fibrillation: Secondary | ICD-10-CM | POA: Diagnosis present

## 2016-11-04 DIAGNOSIS — J90 Pleural effusion, not elsewhere classified: Secondary | ICD-10-CM | POA: Diagnosis present

## 2016-11-04 DIAGNOSIS — Z882 Allergy status to sulfonamides status: Secondary | ICD-10-CM

## 2016-11-04 DIAGNOSIS — Z87891 Personal history of nicotine dependence: Secondary | ICD-10-CM | POA: Diagnosis not present

## 2016-11-04 DIAGNOSIS — E1122 Type 2 diabetes mellitus with diabetic chronic kidney disease: Secondary | ICD-10-CM | POA: Diagnosis present

## 2016-11-04 DIAGNOSIS — J9811 Atelectasis: Secondary | ICD-10-CM | POA: Diagnosis present

## 2016-11-04 DIAGNOSIS — Z66 Do not resuscitate: Secondary | ICD-10-CM | POA: Diagnosis present

## 2016-11-04 DIAGNOSIS — I701 Atherosclerosis of renal artery: Secondary | ICD-10-CM | POA: Diagnosis present

## 2016-11-04 DIAGNOSIS — L03115 Cellulitis of right lower limb: Secondary | ICD-10-CM

## 2016-11-04 DIAGNOSIS — L03119 Cellulitis of unspecified part of limb: Secondary | ICD-10-CM | POA: Insufficient documentation

## 2016-11-04 DIAGNOSIS — I700 Atherosclerosis of aorta: Secondary | ICD-10-CM | POA: Diagnosis present

## 2016-11-04 DIAGNOSIS — M79604 Pain in right leg: Secondary | ICD-10-CM | POA: Diagnosis present

## 2016-11-04 DIAGNOSIS — I129 Hypertensive chronic kidney disease with stage 1 through stage 4 chronic kidney disease, or unspecified chronic kidney disease: Secondary | ICD-10-CM | POA: Diagnosis present

## 2016-11-04 DIAGNOSIS — I10 Essential (primary) hypertension: Secondary | ICD-10-CM | POA: Diagnosis present

## 2016-11-04 DIAGNOSIS — R310 Gross hematuria: Secondary | ICD-10-CM | POA: Diagnosis not present

## 2016-11-04 DIAGNOSIS — E781 Pure hyperglyceridemia: Secondary | ICD-10-CM | POA: Diagnosis present

## 2016-11-04 DIAGNOSIS — E1151 Type 2 diabetes mellitus with diabetic peripheral angiopathy without gangrene: Secondary | ICD-10-CM | POA: Diagnosis present

## 2016-11-04 DIAGNOSIS — K5090 Crohn's disease, unspecified, without complications: Secondary | ICD-10-CM | POA: Diagnosis present

## 2016-11-04 DIAGNOSIS — I82401 Acute embolism and thrombosis of unspecified deep veins of right lower extremity: Principal | ICD-10-CM | POA: Diagnosis present

## 2016-11-04 DIAGNOSIS — Z7952 Long term (current) use of systemic steroids: Secondary | ICD-10-CM

## 2016-11-04 DIAGNOSIS — R599 Enlarged lymph nodes, unspecified: Secondary | ICD-10-CM

## 2016-11-04 DIAGNOSIS — I871 Compression of vein: Secondary | ICD-10-CM

## 2016-11-04 DIAGNOSIS — N183 Chronic kidney disease, stage 3 unspecified: Secondary | ICD-10-CM | POA: Diagnosis present

## 2016-11-04 DIAGNOSIS — E119 Type 2 diabetes mellitus without complications: Secondary | ICD-10-CM

## 2016-11-04 DIAGNOSIS — Z833 Family history of diabetes mellitus: Secondary | ICD-10-CM | POA: Diagnosis not present

## 2016-11-04 DIAGNOSIS — R591 Generalized enlarged lymph nodes: Secondary | ICD-10-CM

## 2016-11-04 HISTORY — DX: Non-Hodgkin lymphoma, unspecified, unspecified site: C85.90

## 2016-11-04 HISTORY — DX: Gastrointestinal hemorrhage, unspecified: K92.2

## 2016-11-04 LAB — CBC WITH DIFFERENTIAL/PLATELET
Basophils Absolute: 0.1 10*3/uL (ref 0–0.1)
Basophils Relative: 1 %
EOS ABS: 0.3 10*3/uL (ref 0–0.7)
EOS PCT: 3 %
HCT: 38.1 % — ABNORMAL LOW (ref 40.0–52.0)
Hemoglobin: 13 g/dL (ref 13.0–18.0)
LYMPHS ABS: 1.7 10*3/uL (ref 1.0–3.6)
LYMPHS PCT: 17 %
MCH: 32 pg (ref 26.0–34.0)
MCHC: 34.2 g/dL (ref 32.0–36.0)
MCV: 93.8 fL (ref 80.0–100.0)
MONO ABS: 1.3 10*3/uL — AB (ref 0.2–1.0)
MONOS PCT: 13 %
Neutro Abs: 6.6 10*3/uL — ABNORMAL HIGH (ref 1.4–6.5)
Neutrophils Relative %: 66 %
PLATELETS: 221 10*3/uL (ref 150–440)
RBC: 4.06 MIL/uL — ABNORMAL LOW (ref 4.40–5.90)
RDW: 14.7 % — AB (ref 11.5–14.5)
WBC: 10 10*3/uL (ref 3.8–10.6)

## 2016-11-04 LAB — COMPREHENSIVE METABOLIC PANEL
ALT: 31 U/L (ref 17–63)
ANION GAP: 9 (ref 5–15)
AST: 37 U/L (ref 15–41)
Albumin: 3.7 g/dL (ref 3.5–5.0)
Alkaline Phosphatase: 64 U/L (ref 38–126)
BUN: 60 mg/dL — ABNORMAL HIGH (ref 6–20)
CHLORIDE: 109 mmol/L (ref 101–111)
CO2: 26 mmol/L (ref 22–32)
CREATININE: 1.35 mg/dL — AB (ref 0.61–1.24)
Calcium: 9.3 mg/dL (ref 8.9–10.3)
GFR, EST AFRICAN AMERICAN: 48 mL/min — AB (ref >60)
GFR, EST NON AFRICAN AMERICAN: 42 mL/min — AB (ref >60)
Glucose, Bld: 125 mg/dL — ABNORMAL HIGH (ref 65–99)
POTASSIUM: 4 mmol/L (ref 3.5–5.1)
SODIUM: 144 mmol/L (ref 135–145)
Total Bilirubin: 1.2 mg/dL (ref 0.3–1.2)
Total Protein: 7 g/dL (ref 6.5–8.1)

## 2016-11-04 LAB — GLUCOSE, CAPILLARY: Glucose-Capillary: 104 mg/dL — ABNORMAL HIGH (ref 65–99)

## 2016-11-04 LAB — APTT: APTT: 30 s (ref 24–36)

## 2016-11-04 LAB — TROPONIN I: Troponin I: <0.03 ng/mL (ref <0.03)

## 2016-11-04 LAB — PROTIME-INR
INR: 1.13
PROTHROMBIN TIME: 14.6 s (ref 11.4–15.2)

## 2016-11-04 LAB — SURGICAL PATHOLOGY

## 2016-11-04 MED ORDER — SODIUM CHLORIDE 0.9% FLUSH
3.0000 mL | Freq: Two times a day (BID) | INTRAVENOUS | Status: DC
  Administered 2016-11-05 – 2016-11-06 (×4): 3 mL via INTRAVENOUS

## 2016-11-04 MED ORDER — ONDANSETRON HCL 4 MG PO TABS: 4.0000 mg | ORAL_TABLET | Freq: Four times a day (QID) | ORAL | Status: DC | PRN

## 2016-11-04 MED ORDER — LATANOPROST 0.005 % OP SOLN
1.0000 [drp] | Freq: Every day | OPHTHALMIC | Status: DC
  Administered 2016-11-05: 23:00:00 1 [drp] via OPHTHALMIC
  Filled 2016-11-04: qty 2.5

## 2016-11-04 MED ORDER — METOPROLOL TARTRATE 25 MG PO TABS
25.0000 mg | ORAL_TABLET | Freq: Two times a day (BID) | ORAL | Status: DC
  Administered 2016-11-05 – 2016-11-06 (×4): 25 mg via ORAL
  Filled 2016-11-04 (×4): qty 1

## 2016-11-04 MED ORDER — HEPARIN BOLUS VIA INFUSION
4000.0000 [IU] | Freq: Once | INTRAVENOUS | Status: AC
  Administered 2016-11-04: 4000 [IU] via INTRAVENOUS
  Filled 2016-11-04: qty 4000

## 2016-11-04 MED ORDER — IOPAMIDOL (ISOVUE-370) INJECTION 76%
100.0000 mL | Freq: Once | INTRAVENOUS | Status: AC | PRN
  Administered 2016-11-04: 100 mL via INTRAVENOUS

## 2016-11-04 MED ORDER — MESALAMINE 400 MG PO CPDR
800.0000 mg | DELAYED_RELEASE_CAPSULE | Freq: Three times a day (TID) | ORAL | Status: DC
  Administered 2016-11-05 – 2016-11-06 (×4): 800 mg via ORAL
  Filled 2016-11-04 (×6): qty 2

## 2016-11-04 MED ORDER — HEPARIN (PORCINE) IN NACL 100-0.45 UNIT/ML-% IJ SOLN
1100.0000 [IU]/h | INTRAMUSCULAR | Status: DC
  Administered 2016-11-04 – 2016-11-05 (×2): 1100 [IU]/h via INTRAVENOUS
  Filled 2016-11-04 (×3): qty 250

## 2016-11-04 MED ORDER — ACETAMINOPHEN 650 MG RE SUPP: 650.0000 mg | Freq: Four times a day (QID) | RECTAL | Status: DC | PRN

## 2016-11-04 MED ORDER — SODIUM CHLORIDE 0.9 % IV SOLN
Freq: Once | INTRAVENOUS | Status: AC
  Administered 2016-11-04: 19:00:00 via INTRAVENOUS

## 2016-11-04 MED ORDER — ONDANSETRON HCL 4 MG/2ML IJ SOLN: 4.0000 mg | Freq: Four times a day (QID) | INTRAMUSCULAR | Status: DC | PRN

## 2016-11-04 MED ORDER — HEPARIN (PORCINE) IN NACL 100-0.45 UNIT/ML-% IJ SOLN: 12.0000 [IU]/kg/h | Freq: Once | INTRAMUSCULAR | Status: DC

## 2016-11-04 MED ORDER — ACETAMINOPHEN 325 MG PO TABS: 650.0000 mg | ORAL_TABLET | Freq: Four times a day (QID) | ORAL | Status: DC | PRN

## 2016-11-04 NOTE — ED Notes (Addendum)
Vital signs in all 4 extremities per MD request:  RUE: 141/81 LUE: 124/74  RLE: 139/81 LLE: 147/73

## 2016-11-04 NOTE — ED Notes (Signed)
Patient transported to Ultrasound 

## 2016-11-04 NOTE — ED Notes (Signed)
Dr. Jannifer Franklin at bedside with pt.

## 2016-11-04 NOTE — H&P (Signed)
[place] [facility] [place] NAME: Spencer Acosta    MR#:  [id number]  DATE OF BIRTH:  Aug 04, 1915  DATE OF ADMISSION:  11/04/2016  PRIMARY CARE PHYSICIAN: Kirk Ruths., MD   REQUESTING/REFERRING PHYSICIAN: Jimmye Norman, MD  CHIEF COMPLAINT:   Chief Complaint  Patient presents with  . Leg Pain    HISTORY OF PRESENT ILLNESS:  Spencer Acosta  is a 82 y.o. male who presents with Right lower extremity swelling and erythema and some pain. Patient states that this is been going on for a little more than a week. Initially it was felt to potentially be cellulitis. However, did not improve with treatment and he came to the ED today for evaluation. Here he was found to have venous thrombus in that extremity. CTA of his abdomen also showed renal artery stenosis, right greater than left, and significant retroperitoneal lymphadenopathy consistent with lymphoma. Patient did have a right inguinal biopsy done about 2 weeks ago which shows B-cell lymphoma. Patient was started on a heparin drip and hospitalists were called for admission  PAST MEDICAL HISTORY:   Past Medical History:  Diagnosis Date  . A-fib ([place])   . CKD (chronic kidney disease)   . Crohn's disease ([place])   . Diabetes mellitus without complication ([place])   . Dysrhythmia   . GI bleed   . Hypertension   . Hypertriglyceridemia 03/11/2014  . Lymphoma ([place])   . PVD (peripheral vascular disease) ([place])     PAST SURGICAL HISTORY:   Past Surgical History:  Procedure Laterality Date  . APPENDECTOMY    . TURP VAPORIZATION      SOCIAL HISTORY:   Social History  Substance Use Topics  . Smoking status: Former Smoker    Quit date: 10/22/1947  . Smokeless tobacco: Never Used     Comment: quit 70 years ago  . Alcohol use No     Comment: occasional    FAMILY HISTORY:   Family History  Problem Relation Age of Onset  . Diabetes Other     DRUG ALLERGIES:   Allergies  Allergen Reactions   . Sulfa Antibiotics Nausea And Vomiting    MEDICATIONS AT HOME:   Prior to Admission medications   Medication Sig Start Date End Date Taking? Authorizing Provider  acidophilus (RISAQUAD) CAPS capsule Take 1 capsule by mouth 2 (two) times daily. 02/22/16  Yes Loletha Grayer, MD  ampicillin (PRINCIPEN) 500 MG capsule Take 1 capsule (500 mg total) by mouth 3 (three) times daily. 10/10/16  Yes Cleon Gustin, MD  cholecalciferol (VITAMIN D) 1000 units tablet Take 1,000 Units by mouth daily.   Yes Historical Provider, MD  hydrochlorothiazide (HYDRODIURIL) 12.5 MG tablet Take by mouth. 06/09/16  Yes Historical Provider, MD  latanoprost (XALATAN) 0.005 % ophthalmic solution Place 1 drop into both eyes at bedtime.   Yes Historical Provider, MD  Mesalamine (ASACOL) 400 MG CPDR DR capsule Take 2 capsules (800 mg total) by mouth 3 (three) times daily. 02/22/16  Yes Loletha Grayer, MD  metoprolol tartrate (LOPRESSOR) 25 MG tablet Take 1 tablet (25 mg total) by mouth 2 (two) times daily. 02/22/16  Yes Richard Leslye Peer, MD  multivitamin-lutein [facility]) CAPS capsule Take 1 capsule by mouth daily.   Yes Historical Provider, MD  predniSONE (DELTASONE) 20 MG tablet Take 3 tablets (60 mg total) by mouth daily. 03/17/16  Yes Loney Hering, MD  albuterol (PROVENTIL HFA;VENTOLIN HFA) 108 (90 Base) MCG/ACT inhaler Inhale 2 puffs into the lungs  every 6 (six) hours as needed. Patient not taking: Reported on 11/04/2016 03/17/16   Loney Hering, MD    REVIEW OF SYSTEMS:  Review of Systems  Constitutional: Negative for chills, fever, malaise/fatigue and weight loss.  HENT: Negative for ear pain, hearing loss and tinnitus.   Eyes: Negative for blurred vision, double vision, pain and redness.  Respiratory: Negative for cough, hemoptysis and shortness of breath.   Cardiovascular: Positive for leg swelling (with erythema and pain). Negative for chest pain, palpitations and orthopnea.  Gastrointestinal:  Negative for abdominal pain, constipation, diarrhea, nausea and vomiting.  Genitourinary: Negative for dysuria, frequency and hematuria.  Musculoskeletal: Negative for back pain, joint pain and neck pain.  Skin:       No acne, rash, or lesions  Neurological: Negative for dizziness, tremors, focal weakness and weakness.  Endo/Heme/Allergies: Negative for polydipsia. Does not bruise/bleed easily.  Psychiatric/Behavioral: Negative for depression. The patient is not nervous/anxious and does not have insomnia.      VITAL SIGNS:   Vitals:   11/04/16 1232 11/04/16 1800 11/04/16 2007  BP: 115/73 121/74 132/74  Pulse: 84 99 95  Resp: 18 16 (!) 25  Temp: 97.7 F (36.5 C)    TempSrc: Oral    SpO2: 99% 100% 100%  Weight: 80.7 kg (178 lb)    Height: 5\' 8"  (1.727 m)     Wt Readings from Last 3 Encounters:  11/04/16 80.7 kg (178 lb)  11/04/16 81 kg (178 lb 9.6 oz)  10/10/16 78.5 kg (173 lb)    PHYSICAL EXAMINATION:  Physical Exam  Vitals reviewed. Constitutional: He is oriented to person, place, and time. He appears well-developed and well-nourished. No distress.  HENT:  Head: Normocephalic and atraumatic.  Mouth/Throat: Oropharynx is clear and moist.  Eyes: Conjunctivae and EOM are normal. Pupils are equal, round, and reactive to light. No scleral icterus.  Neck: Normal range of motion. Neck supple. No JVD present. No thyromegaly present.  Cardiovascular: Normal rate, regular rhythm and intact distal pulses.  Exam reveals no gallop and no friction rub.   No murmur heard. Respiratory: Effort normal and breath sounds normal. No respiratory distress. He has no wheezes. He has no rales.  GI: Soft. Bowel sounds are normal. He exhibits no distension. There is no tenderness.  Musculoskeletal: Normal range of motion. He exhibits edema (Right lower extremity) and tenderness (Right lower extremity).  No arthritis, no gout  Lymphadenopathy:    He has no cervical adenopathy.  Neurological: He is  alert and oriented to person, place, and time. No cranial nerve deficit.  No dysarthria, no aphasia  Skin: Skin is warm and dry. No rash noted. There is erythema (Right lower extremity).  Psychiatric: He has a normal mood and affect. His behavior is normal. Judgment and thought content normal.    LABORATORY PANEL:   CBC  Recent Labs Lab 11/04/16 1242  WBC 10.0  HGB 13.0  HCT 38.1*  PLT 221   ------------------------------------------------------------------------------------------------------------------  Chemistries   Recent Labs Lab 11/04/16 1242  NA 144  K 4.0  CL 109  CO2 26  GLUCOSE 125*  BUN 60*  CREATININE 1.35*  CALCIUM 9.3  AST 37  ALT 31  ALKPHOS 64  BILITOT 1.2   ------------------------------------------------------------------------------------------------------------------  Cardiac Enzymes  Recent Labs Lab 11/04/16 1242  TROPONINI <0.03   ------------------------------------------------------------------------------------------------------------------  RADIOLOGY:  Ct Angio Aortobifemoral W And/or Wo Contrast  Result Date: 11/04/2016 CLINICAL DATA:  Acute presentation with right leg redness and swelling beginning yesterday.  Painful. EXAM: CT ANGIOGRAPHY AOBIFEM WITHOUT AND WITH CONTRAST<Procedure Description>CT ANGIOGRAPHY AOBIFEM WITHOUT AND WITH CONTRAST TECHNIQUE: CT angiography of the aorta and lower extremities. CONTRAST:  100 cc Isovue 370 COMPARISON:  10/04/2016 . FINDINGS: Right pleural effusion with right lower lobe atelectasis. Liver is normal except for 1 cm low-density in the right lobe likely to be benign. Present previously and unchanged. Multiple calcified gallstones. 1.5 cm low-density in the pancreatic body as seen previously. Spleen is normal. Adrenal glands are normal. Renal cysts as previously seen. Massive progression of retroperitoneal all lymphadenopathy and right inguinal and iliac adenopathy since the previous study. The  differential diagnosis is lymphoma versus metastatic disease. This quite likely results and venous obstruction of the right lower extremity. No primary bowel pathology. Bladder is unremarkable. Prostate gland is small. Chronic degenerative changes affect the spine. No destructive bone findings. There is diffuse aortic atherosclerosis. No aneurysm. Celiac origin shows 30% stenosis. Superior mesenteric artery origin is widely patent. No significant distal stenosis is seen. Bilateral renal artery atherosclerosis. Severe stenosis at the proximal right renal artery, possibly 80% or greater. 30% stenosis of the left renal artery. Inferior mesenteric artery is patent with 30% proximal stenosis. Diffuse iliac atherosclerosis without stenosis more than 25 or 30%. Both common femoral artery show atherosclerosis but no stenosis greater than approximately 30%. There is slower flow to the left leg than the right, but the femoral popliteal arteries in the lower leg runoff vessels are patent bilaterally to the foot. There is diffuse swelling in of the right lower extremity with edema probably secondary to venous obstruction. Review of the MIP images confirms the above findings. IMPRESSION: Right pleural effusion with right lower lobe atelectasis. Massive retroperitoneal, right iliac and right inguinal lymphadenopathy, progressive since the previous study and resulting in venous obstruction of the right lower extremity. Swelling and subcutaneous edema of the right lower extremity secondary to venous obstruction. This could be lymphoma or metastatic disease, with lymphoma favored. Aortic atherosclerosis. Atherosclerosis of the branch vessels. Severe stenosis of the right renal artery, 80% or greater. Slower arterial flow to the left leg relative to the right, but without significant lower extremity arterial occlusion or compromise on either side. Chololithiasis. No change in 1.5 cm pancreatic low-density. Electronically Signed   By:  Nelson Chimes M.D.   On: 11/04/2016 19:23   US Venous Img Lower Unilateral Right  Result Date: 11/04/2016 CLINICAL DATA:  Right lower extremity pain and swelling for 2 weeks. EXAM: Right LOWER EXTREMITY VENOUS DOPPLER ULTRASOUND TECHNIQUE: Gray-scale sonography with graded compression, as well as color Doppler and duplex ultrasound were performed to evaluate the lower extremity deep venous systems from the level of the common femoral vein and including the common femoral, femoral, profunda femoral, popliteal and calf veins including the posterior tibial, peroneal and gastrocnemius veins when visible. The superficial great saphenous vein was also interrogated. Spectral Doppler was utilized to evaluate flow at rest and with distal augmentation maneuvers in the common femoral, femoral and popliteal veins. COMPARISON:  None. FINDINGS: Contralateral Common Femoral Vein: Respiratory phasicity is normal and symmetric with the symptomatic side. No evidence of thrombus. Normal compressibility. Common Femoral Vein: Nonocclusive thrombus is noted. Partial flow is noted. Saphenofemoral Junction: No evidence of thrombus. Normal compressibility and flow on color Doppler imaging. Profunda Femoral Vein: No evidence of thrombus. Normal flow on color Doppler imaging. Unable to compress due to large mass in right groin. Femoral Vein: No evidence of thrombus. Normal respiratory phasicity and response to augmentation. Unable  to compress due to large mass in right groin. Popliteal Vein: No evidence of thrombus. Normal compressibility, respiratory phasicity and response to augmentation. Calf Veins: No evidence of thrombus. Normal compressibility and flow on color Doppler imaging. Superficial Great Saphenous Vein: No evidence of thrombus. Normal compressibility and flow on color Doppler imaging. Venous Reflux:  None. Other Findings: Multiple large right inguinal lymph nodes are noted, with the largest measuring 8.5 x 5.8 x 4.8 cm. This  lymph node has been recently biopsied. IMPRESSION: Acute nonocclusive deep venous thrombosis seen in right common femoral vein. Large right inguinal lymph node or mass is noted which has been previously biopsied. Critical Value/emergent results were called by telephone at the time of interpretation on 11/04/2016 at 7:46 pm to Dr. Lenise Arena , who verbally acknowledged these results. Electronically Signed   By: Marijo Conception, M.D.   On: 11/04/2016 19:43    EKG:   Orders placed or performed during the hospital encounter of 11/04/16  . ED EKG  . ED EKG  . EKG 12-Lead  . EKG 12-Lead    IMPRESSION AND PLAN:  Principal Problem:   Right leg DVT ([place]) - patient's lymphadenopathy is compressing his venous system, potentially contributing to slow venous return and formation of his right lower extremity DVT. We started heparin drip, were getting a vascular surgery consult for this problem as well as his renal artery stenosis. See below. Active Problems:   Renal artery stenosis - patient has significant blockage of his right renal artery, 80% or greater. Also has some stenosis of the left renal artery estimated around 30% on CTA. We will order a vascular surgery consult for further evaluation and treatment recommendations.   Lymphoma ([place]) - this is recently diagnosed with biopsy done about 2 weeks ago. Patient states he was unaware of the results. We discussed the diagnosis and we have ordered an oncology consult   Atrial fibrillation ([place]) - rate controlled, continue home rate controlling medications   BP (high blood pressure) - currently normotensive, we will hold his home antihypertensives for now unless his blood pressure rises   Type 2 diabetes mellitus ([place]) - patient is not on medication at home for this. We will check his fingerstick every 8 hours for now, and if his glucose rises significantly we will order sliding scale insulin for coverage. Currently his glucose level is okay and as the  patient will be nothing by mouth for now in regards to the possibility of some procedure tomorrow, we will not order any anti-glycemic indications at this time.   CKD (chronic kidney disease), stage III - stable, avoid nephrotoxins and monitor  All the records are reviewed and case discussed with ED provider. Management plans discussed with the patient and/or family.  DVT PROPHYLAXIS: Systemic anticoagulation  GI PROPHYLAXIS: None  ADMISSION STATUS: Inpatient  CODE STATUS: DNR Code Status History    Date Active Date Inactive Code Status Order ID Comments User Context   10/04/2016  8:29 PM 10/05/2016 12:55 AM DNR [id number]  Hinda Kehr, MD ED   02/19/2016  9:28 PM 02/22/2016  5:43 PM DNR [id number]  Gladstone Lighter, MD Inpatient    Questions for Most Recent Historical Code Status (Order [id number])    Question Answer Comment   In the event of cardiac or respiratory ARREST Do not call a "code blue"    In the event of cardiac or respiratory ARREST Do not perform Intubation, CPR, defibrillation or ACLS    In the event of  cardiac or respiratory ARREST Use medication by any route, position, wound care, and other measures to relive pain and suffering. May use oxygen, suction and manual treatment of airway obstruction as needed for comfort.       TOTAL TIME TAKING CARE OF THIS PATIENT: 45 minutes.    Felita Bump [place] 11/04/2016, 8:28 PM  [facility]  Office  [phone]  CC: Primary care physician; Kirk Ruths., MD

## 2016-11-04 NOTE — ED Triage Notes (Signed)
Patient presents to the ED with right leg redness and swelling that started yesterday.  Patient was sent to the ED by urologist after urologist saw the redness and swelling to patient's right leg.  Patient reports leg is painful as well.  Patient is in no obvious distress at this time.  Patient is alert and oriented x 4.

## 2016-11-04 NOTE — Progress Notes (Signed)
ANTICOAGULATION CONSULT NOTE - Initial Consult  Pharmacy Consult for heparin Indication: DVT  Allergies  Allergen Reactions  . Sulfa Antibiotics Nausea And Vomiting    Patient Measurements: Height: 5\' 8"  (172.7 cm) Weight: 178 lb (80.7 kg) IBW/kg (Calculated) : 68.4 Heparin Dosing Weight: 80.7   Vital Signs: Temp: 97.7 F (36.5 C) (12/05 1232) Temp Source: Oral (12/05 1232) BP: 115/73 (12/05 1232) Pulse Rate: 84 (12/05 1232)  Labs:  Recent Labs  11/04/16 1242  HGB 13.0  HCT 38.1*  PLT 221  APTT 30  LABPROT 14.6  INR 1.13  CREATININE 1.35*  TROPONINI <0.03    Estimated Creatinine Clearance: 28.9 mL/min (by C-G formula based on SCr of 1.35 mg/dL (H)).   Medical History: Past Medical History:  Diagnosis Date  . A-fib ([place])   . CKD (chronic kidney disease)   . Crohn's disease ([place])   . Diabetes mellitus without complication ([place])   . Dysrhythmia   . Hypertension   . Hypertriglyceridemia 03/11/2014  . PVD (peripheral vascular disease) ([place])     Assessment: Pharmacy consulted to dose heparin for DVT in this 82 year old male with history of afib also c/o chest pain. Not on anticoagulation PTA. Baseline labs ordered  Goal of Therapy:  Heparin level 0.3-0.7 units/ml Monitor platelets by anticoagulation protocol: Yes   Plan:  Give 4000 units bolus x 1 Start heparin infusion at 1100 units/hr Check anti-Xa level in 8 hours and daily while on heparin Continue to monitor H&H and platelets  Amira Podolak C 11/04/2016,5:56 PM

## 2016-11-04 NOTE — ED Notes (Signed)
MD at bedside. 

## 2016-11-04 NOTE — ED Provider Notes (Signed)
[facility] Emergency Department Provider Note        Time seen:----------------------------------------- 3:14 PM on 11/04/2016 -----------------------------------------   I have reviewed the triage vital signs and the nursing notes.   HISTORY  Chief Complaint Leg Pain    HPI AXTON DUTCH is a 82 y.o. male who presents to the ER for right leg redness and swelling that reportedly started yesterday. Patientactually states his right leg is bothering for several weeks. Patient was sent by urologist who saw a redness and swelling to his right leg and sent him here. He denies fevers, chills, chest pain or difficulty breathing.    Past Medical History:  Diagnosis Date  . A-fib ([place])   . CKD (chronic kidney disease)   . Crohn's disease ([place])   . Diabetes mellitus without complication ([place])   . Dysrhythmia   . Hypertension   . Hypertriglyceridemia 03/11/2014  . PVD (peripheral vascular disease) [facility])     Patient Active Problem List   Diagnosis Date Noted  . Cellulitis of right leg 11/04/2016  . Gross hematuria 10/10/2016  . Inguinal lymphadenopathy 10/10/2016  . Atrial fibrillation ([place]) 03/20/2016  . Colitis 02/19/2016  . Bleeding hemorrhoid 07/19/2015  . Atherosclerosis of aorta ([place]) 10/01/2014  . Crohn's disease of colon ([place]) 03/11/2014  . BP (high blood pressure) 03/11/2014  . Hypertriglyceridemia 03/11/2014  . Peripheral vascular disease ([place]) 03/11/2014  . Type 2 diabetes mellitus ([place]) 03/11/2014    Past Surgical History:  Procedure Laterality Date  . APPENDECTOMY    . TURP VAPORIZATION      Allergies Sulfa antibiotics  Social History Social History  Substance Use Topics  . Smoking status: Former Smoker    Quit date: 10/22/1947  . Smokeless tobacco: Never Used     Comment: quit 70 years ago  . Alcohol use No     Comment: occasional    Review of Systems Constitutional: Negative for fever. Cardiovascular: Negative for chest  pain. Respiratory: Negative for shortness of breath. Gastrointestinal: Negative for abdominal pain, vomiting and diarrhea. Musculoskeletal: Positive for right leg pain and swelling Skin: Positive for right leg rash Neurological: Negative for headaches, focal weakness or numbness.  10-point ROS otherwise negative.  ____________________________________________   PHYSICAL EXAM:  VITAL SIGNS: ED Triage Vitals [11/04/16 1232]  Enc Vitals Group     BP 115/73     Pulse Rate 84     Resp 18     Temp 97.7 F (36.5 C)     Temp Source Oral     SpO2 99 %     Weight 178 lb (80.7 kg)     Height 5\' 8"  (1.727 m)     Head Circumference      Peak Flow      Pain Score 3     Pain Loc      Pain Edu?      Excl. in [place]?     Constitutional: Alert and oriented. Well appearing and in no distress. Eyes: Conjunctivae are normal. PERRL. Normal extraocular movements. ENT   Head: Normocephalic and atraumatic.   Nose: No congestion/rhinnorhea.   Mouth/Throat: Mucous membranes are moist.   Neck: No stridor. Cardiovascular: Normal rate, regular rhythm. No murmurs, rubs, or gallops.Diminished pulses in lower extremities, dopplerable pulses in the right DP, posterior tibial and popliteal regions Respiratory: Normal respiratory effort without tachypnea nor retractions. Breath sounds are clear and equal bilaterally. No wheezes/rales/rhonchi. Gastrointestinal: Soft and nontender. Normal bowel sounds Musculoskeletal: Pain with range of motion  of the right leg, both legs are mottled in appearance but the right is plethoric with vasculitic lesions that extend from foot to groin. Right leg is markedly swollen compared to left diffusely Neurologic:  Normal speech and language. No gross focal neurologic deficits are appreciated.  Skin:  Purpuric lesions and petechia noted to the right leg Psychiatric: Mood and affect are normal. Speech and behavior are normal.   ____________________________________________  EKG: Interpreted by me. Patient fibrillation with a rate of 85 bpm, normal QRS size, normal QT, possible inferior and anterior infarcts age indeterminate.  ____________________________________________  ED COURSE:  Pertinent labs & imaging results that were available during my care of the patient were reviewed by me and considered in my medical decision making (see chart for details). Clinical Course   Patient resents to ER for right leg swelling and pain. We will assess for DVT versus vasculitis or arterial clot versus emboli.  Procedures ____________________________________________   LABS (pertinent positives/negatives)  Labs Reviewed  CBC WITH DIFFERENTIAL/PLATELET - Abnormal; Notable for the following:       Result Value   RBC 4.06 (*)    HCT 38.1 (*)    RDW 14.7 (*)    Neutro Abs 6.6 (*)    Monocytes Absolute 1.3 (*)    All other components within normal limits  COMPREHENSIVE METABOLIC PANEL - Abnormal; Notable for the following:    Glucose, Bld 125 (*)    BUN 60 (*)    Creatinine, Ser 1.35 (*)    GFR calc non Af Amer 42 (*)    GFR calc Af Amer 48 (*)    All other components within normal limits  PROTIME-INR  APTT  TROPONIN I    RADIOLOGY US venous IMPRESSION: Right pleural effusion with right lower lobe atelectasis.  Massive retroperitoneal, right iliac and right inguinal lymphadenopathy, progressive since the previous study and resulting in venous obstruction of the right lower extremity. Swelling and subcutaneous edema of the right lower extremity secondary to venous obstruction.  This could be lymphoma or metastatic disease, with lymphoma favored.  Aortic atherosclerosis. Atherosclerosis of the branch vessels. Severe stenosis of the right renal artery, 80% or greater. Slower arterial flow to the left leg relative to the right, but without significant lower extremity arterial occlusion or compromise  on either side.  Chololithiasis.  No change in 1.5 cm pancreatic low-density.  CRITICAL CARE Performed by: Earleen Newport   Total critical care time: 30 minutes  Critical care time was exclusive of separately billable procedures and treating other patients.  Critical care was necessary to treat or prevent imminent or life-threatening deterioration.  Critical care was time spent personally by me on the following activities: development of treatment plan with patient and/or surrogate as well as nursing, discussions with consultants, evaluation of patient's response to treatment, examination of patient, obtaining history from patient or surrogate, ordering and performing treatments and interventions, ordering and review of laboratory studies, ordering and review of radiographic studies, pulse oximetry and re-evaluation of patient's condition.  ____________________________________________  FINAL ASSESSMENT AND PLAN  Venous obstruction, adenopathy, atrial fibrillation  Plan: Patient with labs and imaging as dictated above. Patient was placed on heparin, he will likely need surgical consultation for possible decompression of the right lower extremity venous system if the patient is surgical candidate. Currently he has neurovascular intact in the right lower extremity but is complaining of pain. I will discuss with the hospitalist for admission   Earleen Newport, MD   Note:  This dictation was prepared with Dragon dictation. Any transcriptional errors that result from this process are unintentional    Earleen Newport, MD 11/04/16 1932

## 2016-11-04 NOTE — Progress Notes (Signed)
11/04/2016 11:53 AM   Spencer Acosta 1915-03-14 [id number]  Referring provider: Kirk Ruths, MD [facility] [place], [place] [id number]  No chief complaint on file.   HPI: Mr Lewin is a 82yo here for followup for gross hematuria and right inguinal lymphadenopathy. Gross hematuria resolved after entercoccus UTI was treated. No other LUTS. He underwent biopsy of the right inguinal lymph nodes on 11/21. He noted worsening swelling of his leg over the last week. He has new right groin pain that is sharp, constant moderate and radiates to his foot.     PMH: Past Medical History:  Diagnosis Date  . A-fib ([place])   . CKD (chronic kidney disease)   . Crohn's disease ([place])   . Diabetes mellitus without complication ([place])   . Dysrhythmia   . Hypertension   . Hypertriglyceridemia 03/11/2014  . PVD (peripheral vascular disease) [facility])     Surgical History: Past Surgical History:  Procedure Laterality Date  . APPENDECTOMY    . TURP VAPORIZATION      Home Medications:    Medication List       Accurate as of 11/04/16 11:53 AM. Always use your most recent med list.          acidophilus Caps capsule Take 1 capsule by mouth 2 (two) times daily.   albuterol 108 (90 Base) MCG/ACT inhaler Commonly known as:  PROVENTIL HFA;VENTOLIN HFA Inhale 2 puffs into the lungs every 6 (six) hours as needed.   ampicillin 500 MG capsule Commonly known as:  PRINCIPEN Take 1 capsule (500 mg total) by mouth 3 (three) times daily.   cholecalciferol 1000 units tablet Commonly known as:  VITAMIN D Take 1,000 Units by mouth daily.   hydrochlorothiazide 12.5 MG tablet Commonly known as:  HYDRODIURIL Take by mouth.   latanoprost 0.005 % ophthalmic solution Commonly known as:  XALATAN Place 1 drop into both eyes at bedtime.   Mesalamine 400 MG Cpdr DR capsule Commonly known as:  ASACOL Take 2 capsules (800 mg total) by mouth 3 (three) times daily.     metoprolol tartrate 25 MG tablet Commonly known as:  LOPRESSOR Take 1 tablet (25 mg total) by mouth 2 (two) times daily.   predniSONE 20 MG tablet Commonly known as:  DELTASONE Take 3 tablets (60 mg total) by mouth daily.       Allergies:  Allergies  Allergen Reactions  . Sulfa Antibiotics Nausea And Vomiting    Family History: No family history on file.  Social History:  reports that he quit smoking about 69 years ago. He has never used smokeless tobacco. He reports that he does not drink alcohol or use drugs.  ROS: UROLOGY Frequent Urination?: No Hard to postpone urination?: No Burning/pain with urination?: No Get up at night to urinate?: No Leakage of urine?: No Urine stream starts and stops?: No Trouble starting stream?: No Do you have to strain to urinate?: No Blood in urine?: No Urinary tract infection?: No Sexually transmitted disease?: No Injury to kidneys or bladder?: No Painful intercourse?: No Weak stream?: No Erection problems?: No Penile pain?: No  Gastrointestinal Nausea?: No Vomiting?: No Indigestion/heartburn?: No Diarrhea?: No Constipation?: No  Constitutional Fever: No Night sweats?: No Weight loss?: No Fatigue?: No  Skin Skin rash/lesions?: No Itching?: No  Eyes Blurred vision?: No Double vision?: No  Ears/Nose/Throat Sore throat?: No Sinus problems?: No  Hematologic/Lymphatic Swollen glands?: No Easy bruising?: No  Cardiovascular Leg swelling?: No Chest  pain?: No  Respiratory Cough?: No Shortness of breath?: No  Endocrine Excessive thirst?: No  Musculoskeletal Back pain?: No  Neurological Headaches?: No Dizziness?: No  Psychologic Depression?: No Anxiety?: No  Physical Exam: BP 136/79   Pulse 92   Ht 5\' 8"  (1.727 m)   Wt 81 kg (178 lb 9.6 oz)   BMI 27.16 kg/m   Constitutional:  Alert and oriented, No acute distress. HEENT: [place] AT, moist mucus membranes.  Trachea midline, no  masses. Cardiovascular: No clubbing, cyanosis, or edema. Respiratory: Normal respiratory effort, no increased work of breathing. GI: Abdomen is soft, nontender, nondistended, no abdominal masses GU: No CVA tenderness.  Skin: No rashes, bruises or suspicious lesions. Lymph: No cervical or inguinal adenopathy. Neurologic: Grossly intact, no focal deficits, moving all 4 extremities. Psychiatric: Normal mood and affect. Severe right lower extremity edema. Cellulitis over inguinal biopsy site  Laboratory Data: Lab Results  Component Value Date   WBC 8.5 10/04/2016   HGB 13.4 10/04/2016   HCT 38.3 (L) 10/04/2016   MCV 92.2 10/04/2016   PLT 209 10/04/2016    Lab Results  Component Value Date   CREATININE 1.34 (H) 10/04/2016    No results found for: PSA  No results found for: TESTOSTERONE  No results found for: HGBA1C  Urinalysis    Component Value Date/Time   COLORURINE RED (A) 10/04/2016 1802   APPEARANCEUR TURBID (A) 10/04/2016 1802   LABSPEC 1.018 10/04/2016 1802   PHURINE  10/04/2016 1802    TEST NOT REPORTED DUE TO COLOR INTERFERENCE OF URINE PIGMENT   GLUCOSEU (A) 10/04/2016 1802    TEST NOT REPORTED DUE TO COLOR INTERFERENCE OF URINE PIGMENT   HGBUR (A) 10/04/2016 1802    TEST NOT REPORTED DUE TO COLOR INTERFERENCE OF URINE PIGMENT   BILIRUBINUR (A) 10/04/2016 1802    TEST NOT REPORTED DUE TO COLOR INTERFERENCE OF URINE PIGMENT   KETONESUR (A) 10/04/2016 1802    TEST NOT REPORTED DUE TO COLOR INTERFERENCE OF URINE PIGMENT   PROTEINUR (A) 10/04/2016 1802    TEST NOT REPORTED DUE TO COLOR INTERFERENCE OF URINE PIGMENT   NITRITE (A) 10/04/2016 1802    TEST NOT REPORTED DUE TO COLOR INTERFERENCE OF URINE PIGMENT   LEUKOCYTESUR (A) 10/04/2016 1802    TEST NOT REPORTED DUE TO COLOR INTERFERENCE OF URINE PIGMENT    Pertinent Imaging: none  Assessment & Plan:    1. Gross hematuria: Resolved after treating enteroccus UTI  2. Right inguinal and lower extremity  edema with cellulitis: Pt sent to ER for evaluation  3. RTC 2 weeks for inguinal lymph node biopsy discussion  There are no diagnoses linked to this encounter.  No Follow-up on file.  Nicolette Bang, MD  [facility] [address], [place], [place] [address] [phone]

## 2016-11-04 NOTE — ED Notes (Signed)
Patient transported to CT 

## 2016-11-05 DIAGNOSIS — M79604 Pain in right leg: Secondary | ICD-10-CM

## 2016-11-05 DIAGNOSIS — I82401 Acute embolism and thrombosis of unspecified deep veins of right lower extremity: Secondary | ICD-10-CM

## 2016-11-05 DIAGNOSIS — I4891 Unspecified atrial fibrillation: Secondary | ICD-10-CM

## 2016-11-05 DIAGNOSIS — I701 Atherosclerosis of renal artery: Secondary | ICD-10-CM

## 2016-11-05 LAB — HEPARIN LEVEL (UNFRACTIONATED)
HEPARIN UNFRACTIONATED: 0.57 [IU]/mL (ref 0.30–0.70)
HEPARIN UNFRACTIONATED: 0.59 [IU]/mL (ref 0.30–0.70)

## 2016-11-05 LAB — BASIC METABOLIC PANEL
Anion gap: 8 (ref 5–15)
BUN: 54 mg/dL — ABNORMAL HIGH (ref 6–20)
CHLORIDE: 110 mmol/L (ref 101–111)
CO2: 27 mmol/L (ref 22–32)
CREATININE: 1.45 mg/dL — AB (ref 0.61–1.24)
Calcium: 8.8 mg/dL — ABNORMAL LOW (ref 8.9–10.3)
GFR, EST AFRICAN AMERICAN: 44 mL/min — AB (ref >60)
GFR, EST NON AFRICAN AMERICAN: 38 mL/min — AB (ref >60)
Glucose, Bld: 94 mg/dL (ref 65–99)
POTASSIUM: 4.2 mmol/L (ref 3.5–5.1)
SODIUM: 145 mmol/L (ref 135–145)

## 2016-11-05 LAB — CBC
HCT: 33.5 % — ABNORMAL LOW (ref 40.0–52.0)
Hemoglobin: 11.5 g/dL — ABNORMAL LOW (ref 13.0–18.0)
MCH: 32.5 pg (ref 26.0–34.0)
MCHC: 34.5 g/dL (ref 32.0–36.0)
MCV: 94.2 fL (ref 80.0–100.0)
PLATELETS: 197 10*3/uL (ref 150–440)
RBC: 3.55 MIL/uL — AB (ref 4.40–5.90)
RDW: 14.4 % (ref 11.5–14.5)
WBC: 9 10*3/uL (ref 3.8–10.6)

## 2016-11-05 LAB — GLUCOSE, CAPILLARY
GLUCOSE-CAPILLARY: 87 mg/dL (ref 65–99)
GLUCOSE-CAPILLARY: 94 mg/dL (ref 65–99)
GLUCOSE-CAPILLARY: 98 mg/dL (ref 65–99)
Glucose-Capillary: 95 mg/dL (ref 65–99)
Glucose-Capillary: 91 mg/dL (ref 65–99)

## 2016-11-05 LAB — MRSA PCR SCREENING: MRSA by PCR: NEGATIVE

## 2016-11-05 NOTE — Progress Notes (Signed)
[place] [facility] [place] NAME: Joses Galle    MRN#:  [id number]  DATE OF BIRTH:  19-Dec-1914  SUBJECTIVE:  Hospital Day: 1 day Ambers Thibert is a 82 y.o. male presenting with Leg Pain .   Overnight events: no overnight events Interval Events: no complaints  REVIEW OF SYSTEMS:  CONSTITUTIONAL: No fever, fatigue or weakness.  EYES: No blurred or double vision.  EARS, NOSE, AND THROAT: No tinnitus or ear pain.  RESPIRATORY: No cough, shortness of breath, wheezing or hemoptysis.  CARDIOVASCULAR: No chest pain, orthopnea, positive edema.  GASTROINTESTINAL: No nausea, vomiting, diarrhea or abdominal pain.  GENITOURINARY: No dysuria, hematuria.  ENDOCRINE: No polyuria, nocturia,  HEMATOLOGY: No anemia, easy bruising or bleeding SKIN: No rash or lesion. MUSCULOSKELETAL: No joint pain or arthritis.   NEUROLOGIC: No tingling, numbness, weakness.  PSYCHIATRY: No anxiety or depression.   DRUG ALLERGIES:   Allergies  Allergen Reactions  . Sulfa Antibiotics Nausea And Vomiting    VITALS:  Blood pressure 126/72, pulse 80, temperature 97.5 F (36.4 C), temperature source Oral, resp. rate 19, height 5\' 8"  (1.727 m), weight 80.3 kg (177 lb), SpO2 98 %.  PHYSICAL EXAMINATION:  VITAL SIGNS: Vitals:   11/05/16 0353 11/05/16 0845  BP: 121/64 126/72  Pulse: 76 80  Resp: 19   Temp: 97.5 F (36.4 C)    GENERAL:82 y.o.male currently in no acute distress.  HEAD: Normocephalic, atraumatic.  EYES: Pupils equal, round, reactive to light. Extraocular muscles intact. No scleral icterus.  MOUTH: Moist mucosal membrane. Dentition intact. No abscess noted.  EAR, NOSE, THROAT: Clear without exudates. No external lesions.  NECK: Supple. No thyromegaly. No nodules. No JVD.  PULMONARY: Clear to ascultation, without wheeze rails or rhonci. No use of accessory muscles, Good respiratory effort. good air entry bilaterally CHEST: Nontender to palpation.   CARDIOVASCULAR: S1 and S2. Regular rate and rhythm. No murmurs, rubs, or gallops. Right leg 1-2+edema. Pedal pulses 2+ bilaterally.  GASTROINTESTINAL: Soft, nontender, nondistended. No masses. Positive bowel sounds. No hepatosplenomegaly.  MUSCULOSKELETAL: No swelling, clubbing, or edema. Range of motion full in all extremities.  NEUROLOGIC: Cranial nerves II through XII are intact. No gross focal neurological deficits. Sensation intact. Reflexes intact.  SKIN: No ulceration, lesions, rashes, or cyanosis. Skin warm and dry. Turgor intact.  PSYCHIATRIC: Mood, affect within normal limits. The patient is awake, alert and oriented x 3. Insight, judgment intact.      LABORATORY PANEL:   CBC  Recent Labs Lab 11/05/16 0402  WBC 9.0  HGB 11.5*  HCT 33.5*  PLT 197   ------------------------------------------------------------------------------------------------------------------  Chemistries   Recent Labs Lab 11/04/16 1242 11/05/16 0402  NA 144 145  K 4.0 4.2  CL 109 110  CO2 26 27  GLUCOSE 125* 94  BUN 60* 54*  CREATININE 1.35* 1.45*  CALCIUM 9.3 8.8*  AST 37  --   ALT 31  --   ALKPHOS 64  --   BILITOT 1.2  --    ------------------------------------------------------------------------------------------------------------------  Cardiac Enzymes  Recent Labs Lab 11/04/16 1242  TROPONINI <0.03   ------------------------------------------------------------------------------------------------------------------  RADIOLOGY:  Ct Angio Aortobifemoral W And/or Wo Contrast  Result Date: 11/04/2016 CLINICAL DATA:  Acute presentation with right leg redness and swelling beginning yesterday. Painful. EXAM: CT ANGIOGRAPHY AOBIFEM WITHOUT AND WITH CONTRAST<Procedure Description>CT ANGIOGRAPHY AOBIFEM WITHOUT AND WITH CONTRAST TECHNIQUE: CT angiography of the aorta and lower extremities. CONTRAST:  100 cc Isovue 370 COMPARISON:  10/04/2016 . FINDINGS: Right pleural effusion  with right  lower lobe atelectasis. Liver is normal except for 1 cm low-density in the right lobe likely to be benign. Present previously and unchanged. Multiple calcified gallstones. 1.5 cm low-density in the pancreatic body as seen previously. Spleen is normal. Adrenal glands are normal. Renal cysts as previously seen. Massive progression of retroperitoneal all lymphadenopathy and right inguinal and iliac adenopathy since the previous study. The differential diagnosis is lymphoma versus metastatic disease. This quite likely results and venous obstruction of the right lower extremity. No primary bowel pathology. Bladder is unremarkable. Prostate gland is small. Chronic degenerative changes affect the spine. No destructive bone findings. There is diffuse aortic atherosclerosis. No aneurysm. Celiac origin shows 30% stenosis. Superior mesenteric artery origin is widely patent. No significant distal stenosis is seen. Bilateral renal artery atherosclerosis. Severe stenosis at the proximal right renal artery, possibly 80% or greater. 30% stenosis of the left renal artery. Inferior mesenteric artery is patent with 30% proximal stenosis. Diffuse iliac atherosclerosis without stenosis more than 25 or 30%. Both common femoral artery show atherosclerosis but no stenosis greater than approximately 30%. There is slower flow to the left leg than the right, but the femoral popliteal arteries in the lower leg runoff vessels are patent bilaterally to the foot. There is diffuse swelling in of the right lower extremity with edema probably secondary to venous obstruction. Review of the MIP images confirms the above findings. IMPRESSION: Right pleural effusion with right lower lobe atelectasis. Massive retroperitoneal, right iliac and right inguinal lymphadenopathy, progressive since the previous study and resulting in venous obstruction of the right lower extremity. Swelling and subcutaneous edema of the right lower extremity secondary to venous  obstruction. This could be lymphoma or metastatic disease, with lymphoma favored. Aortic atherosclerosis. Atherosclerosis of the branch vessels. Severe stenosis of the right renal artery, 80% or greater. Slower arterial flow to the left leg relative to the right, but without significant lower extremity arterial occlusion or compromise on either side. Chololithiasis. No change in 1.5 cm pancreatic low-density. Electronically Signed   By: Nelson Chimes M.D.   On: 11/04/2016 19:23   US Venous Img Lower Unilateral Right  Result Date: 11/04/2016 CLINICAL DATA:  Right lower extremity pain and swelling for 2 weeks. EXAM: Right LOWER EXTREMITY VENOUS DOPPLER ULTRASOUND TECHNIQUE: Gray-scale sonography with graded compression, as well as color Doppler and duplex ultrasound were performed to evaluate the lower extremity deep venous systems from the level of the common femoral vein and including the common femoral, femoral, profunda femoral, popliteal and calf veins including the posterior tibial, peroneal and gastrocnemius veins when visible. The superficial great saphenous vein was also interrogated. Spectral Doppler was utilized to evaluate flow at rest and with distal augmentation maneuvers in the common femoral, femoral and popliteal veins. COMPARISON:  None. FINDINGS: Contralateral Common Femoral Vein: Respiratory phasicity is normal and symmetric with the symptomatic side. No evidence of thrombus. Normal compressibility. Common Femoral Vein: Nonocclusive thrombus is noted. Partial flow is noted. Saphenofemoral Junction: No evidence of thrombus. Normal compressibility and flow on color Doppler imaging. Profunda Femoral Vein: No evidence of thrombus. Normal flow on color Doppler imaging. Unable to compress due to large mass in right groin. Femoral Vein: No evidence of thrombus. Normal respiratory phasicity and response to augmentation. Unable to compress due to large mass in right groin. Popliteal Vein: No evidence of  thrombus. Normal compressibility, respiratory phasicity and response to augmentation. Calf Veins: No evidence of thrombus. Normal compressibility and flow on color Doppler imaging. Superficial  Great Saphenous Vein: No evidence of thrombus. Normal compressibility and flow on color Doppler imaging. Venous Reflux:  None. Other Findings: Multiple large right inguinal lymph nodes are noted, with the largest measuring 8.5 x 5.8 x 4.8 cm. This lymph node has been recently biopsied. IMPRESSION: Acute nonocclusive deep venous thrombosis seen in right common femoral vein. Large right inguinal lymph node or mass is noted which has been previously biopsied. Critical Value/emergent results were called by telephone at the time of interpretation on 11/04/2016 at 7:46 pm to Dr. Lenise Arena , who verbally acknowledged these results. Electronically Signed   By: Marijo Conception, M.D.   On: 11/04/2016 19:43    EKG:   Orders placed or performed during the hospital encounter of 11/04/16  . EKG 12-Lead  . EKG 12-Lead    ASSESSMENT AND PLAN:   Deunte Critcher is a 82 y.o. male presenting with Leg Pain . Admitted 11/04/2016 : Day #: 1 day 1. Right lower extremity DVT: Continue on heparin drip oncology consult pending 2. B-cell lymphoma oncology pending 3. Essential hypertension: Lopressor    All the records are reviewed and case discussed with Care Management/Social Workerr. Management plans discussed with the patient, family and they are in agreement.  CODE STATUS: full TOTAL TIME TAKING CARE OF THIS PATIENT: 28 minutes.   POSSIBLE D/C IN 1-2DAYS, DEPENDING ON CLINICAL CONDITION.   Breauna Mazzeo,  Karenann Cai.D on 11/05/2016 at 1:24 PM  Between 7am to 6pm - Pager - [phone]  After 6pm: House Pager: - [place] [facility]  Office  [phone]  CC: Primary care physician; Kirk Ruths., MD

## 2016-11-05 NOTE — Care Management (Signed)
Admitted to [facility] with the diagnosis of right leg DVT. Daughter is [name] [phone]. She lives in [place], [place]. Last seen Dr. Frazier Richards 3 weeks ago. [facility] x 17 years. Physical therapy on outpatient basis. No skilled facility. No home oxygen. 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 Uses a rolling walker and cane to aide in ambulation. No falls. Good appetite. Takes care of all basic activities of daily living himself, doesn't drive. Prescriptions are delivered to the home. States that [facility] will transport him when discharged. Shelbie Ammons RN MSN CCM Care Management

## 2016-11-05 NOTE — Clinical Social Work Note (Signed)
Patient is from [place], MSW to follow in case social work needs arise.  Jones Broom. Luanna Weesner, MSW [phone]  Mon-Fri 8a-4:30p 11/05/2016 11:09 AM

## 2016-11-05 NOTE — Care Management Important Message (Signed)
Important Message  Patient Details  Name: Spencer Acosta MRN: [id number] Date of Birth: February 05, 1915   Medicare Important Message Given:  Yes    Shelbie Ammons, RN 11/05/2016, 8:30 AM

## 2016-11-05 NOTE — Progress Notes (Signed)
ANTICOAGULATION CONSULT NOTE - Initial Consult  Pharmacy Consult for heparin Indication: DVT  Allergies  Allergen Reactions  . Sulfa Antibiotics Nausea And Vomiting    Patient Measurements: Height: 5\' 8"  (172.7 cm) Weight: 177 lb (80.3 kg) IBW/kg (Calculated) : 68.4 Heparin Dosing Weight: 80.7   Vital Signs: Temp: 97.5 F (36.4 C) (12/06 0353) Temp Source: Oral (12/06 0353) BP: 121/64 (12/06 0353) Pulse Rate: 76 (12/06 0353)  Labs:  Recent Labs  11/04/16 1242 11/05/16 0402  HGB 13.0 11.5*  HCT 38.1* 33.5*  PLT 221 197  APTT 30  --   LABPROT 14.6  --   INR 1.13  --   HEPARINUNFRC  --  0.57  CREATININE 1.35* 1.45*  TROPONINI <0.03  --     Estimated Creatinine Clearance: 26.9 mL/min (by C-G formula based on SCr of 1.45 mg/dL (H)).   Medical History: Past Medical History:  Diagnosis Date  . A-fib ([place])   . CKD (chronic kidney disease)   . Crohn's disease ([place])   . Diabetes mellitus without complication ([place])   . Dysrhythmia   . GI bleed   . Hypertension   . Hypertriglyceridemia 03/11/2014  . Lymphoma ([place])   . PVD (peripheral vascular disease) ([place])     Assessment: Pharmacy consulted to dose heparin for DVT in this 82 year old male with history of afib also c/o chest pain. Not on anticoagulation PTA. Baseline labs ordered  Goal of Therapy:  Heparin level 0.3-0.7 units/ml Monitor platelets by anticoagulation protocol: Yes   Plan:  Give 4000 units bolus x 1 Start heparin infusion at 1100 units/hr Check anti-Xa level in 8 hours and daily while on heparin Continue to monitor H&H and platelets  12/6 0402 HL therapeutic x 1. Continue current rate and recheck in 8 hours.  Laural Benes, Pharm.D., BCPS Clinical Pharmacist 11/05/2016,6:31 AM

## 2016-11-05 NOTE — Progress Notes (Signed)
ANTICOAGULATION CONSULT NOTE - Follow Up Consult  Pharmacy Consult for Heparin Indication: DVT  Allergies  Allergen Reactions  . Sulfa Antibiotics Nausea And Vomiting    Patient Measurements: Height: 5\' 8"  (172.7 cm) Weight: 177 lb (80.3 kg) IBW/kg (Calculated) : 68.4 Heparin Dosing Weight: 80.3 kg  Vital Signs: Temp: 97.5 F (36.4 C) (12/06 0353) Temp Source: Oral (12/06 0353) BP: 126/72 (12/06 0845) Pulse Rate: 80 (12/06 0845)  Labs:  Recent Labs  11/04/16 1242 11/05/16 0402 11/05/16 1155  HGB 13.0 11.5*  --   HCT 38.1* 33.5*  --   PLT 221 197  --   APTT 30  --   --   LABPROT 14.6  --   --   INR 1.13  --   --   HEPARINUNFRC  --  0.57 0.59  CREATININE 1.35* 1.45*  --   TROPONINI <0.03  --   --     Estimated Creatinine Clearance: 26.9 mL/min (by C-G formula based on SCr of 1.45 mg/dL (H)).   Medications:  Infusions:  . heparin 1,100 Units/hr (11/04/16 2001)    Assessment: Pharmacy consulted to dose heparin for DVT in this 82 year old male with history of afib also c/o chest pain. Not on anticoagulation PTA.  12/6 1200 HL therapeutic at 0.59, 2nd consecutive therapeutic level.  Goal of Therapy:  Heparin level 0.3-0.7 units/ml Monitor platelets by anticoagulation protocol: Yes   Plan:  Will continue with current rate. Next Heparin level and CBC with AM labs.  Paulina Fusi, PharmD, BCPS 11/05/2016 2:02 PM

## 2016-11-05 NOTE — Consult Note (Signed)
[facility] Vascular Consult Note  MRN : [id number]  Spencer Acosta is a 82 y.o. (11-25-1915) male who presents with chief complaint of  Chief Complaint  Patient presents with  . Leg Pain  .  History of Present Illness: I am asked to evaluate the patient by Dr. Lavetta Nielsen for treatment recommendations regarding his DVT.  The patient is a 82 y.o. male who presented to the emergency room yesterday with right lower extremity swelling and pain. Patient states that this is been going on for a about a week. Initially it was treated as a cellulitis. However, did not improve with treatment and he came to the ED for evaluation. Here he was found to have venous thrombus in that extremity. CTA of his abdomen also showed renal artery stenosis, right greater than left, and significant retroperitoneal lymphadenopathy consistent with lymphoma. Patient did have a right inguinal biopsy done about 2 weeks ago which shows B-cell lymphoma. Patient was started on a heparin drip and I was consulted.  Current Facility-Administered Medications  Medication Dose Route Frequency Provider Last Rate Last Dose  . acetaminophen (TYLENOL) tablet 650 mg  650 mg Oral Q6H PRN Lance Coon, MD       Or  . acetaminophen (TYLENOL) suppository 650 mg  650 mg Rectal Q6H PRN Lance Coon, MD      . heparin ADULT infusion 100 units/mL (25000 units/253mL sodium chloride 0.45%)  1,100 Units/hr Intravenous Continuous Earleen Newport, MD 11 mL/hr at 11/05/16 1532 1,100 Units/hr at 11/05/16 1532  . latanoprost (XALATAN) 0.005 % ophthalmic solution 1 drop  1 drop Both Eyes QHS Lance Coon, MD      . Mesalamine (ASACOL) DR capsule 800 mg  800 mg Oral TID Lance Coon, MD   800 mg at 11/05/16 1736  . metoprolol tartrate (LOPRESSOR) tablet 25 mg  25 mg Oral BID Lance Coon, MD   25 mg at 11/05/16 0845  . ondansetron (ZOFRAN) tablet 4 mg  4 mg Oral Q6H PRN Lance Coon, MD       Or  . ondansetron [facility]) injection 4  mg  4 mg Intravenous Q6H PRN Lance Coon, MD      . sodium chloride flush (NS) 0.9 % injection 3 mL  3 mL Intravenous Q12H Lance Coon, MD   3 mL at 11/05/16 0850    Past Medical History:  Diagnosis Date  . A-fib ([place])   . CKD (chronic kidney disease)   . Crohn's disease ([place])   . Diabetes mellitus without complication ([place])   . Dysrhythmia   . GI bleed   . Hypertension   . Hypertriglyceridemia 03/11/2014  . Lymphoma ([place])   . PVD (peripheral vascular disease) ([place])     Past Surgical History:  Procedure Laterality Date  . APPENDECTOMY    . TURP VAPORIZATION      Social History Social History  Substance Use Topics  . Smoking status: Former Smoker    Quit date: 10/22/1947  . Smokeless tobacco: Never Used     Comment: quit 70 years ago  . Alcohol use No     Comment: occasional    Family History Family History  Problem Relation Age of Onset  . Diabetes Other   No family history of bleeding clotting disorders, porphyria or autoimmune disease  Allergies  Allergen Reactions  . Sulfa Antibiotics Nausea And Vomiting     REVIEW OF SYSTEMS (Negative unless checked)  Constitutional: [] Weight loss  [] Fever  [] Chills Cardiac: [] Chest  pain   [] Chest pressure   [] Palpitations   [] Shortness of breath when laying flat   [] Shortness of breath at rest   [] Shortness of breath with exertion. Vascular:  [x] Pain in legs with walking   [x] Pain in legs at rest   [] Pain in legs when laying flat   [] Claudication   [] Pain in feet when walking  [] Pain in feet at rest  [] Pain in feet when laying flat   [] History of DVT   [] Phlebitis   [x] Swelling in legs   [] Varicose veins   [] Non-healing ulcers Pulmonary:   [] Uses home oxygen   [] Productive cough   [] Hemoptysis   [] Wheeze  [] COPD   [] Asthma Neurologic:  [] Dizziness  [] Blackouts   [] Seizures   [] History of stroke   [] History of TIA  [] Aphasia   [] Temporary blindness   [] Dysphagia   [] Weakness or numbness in arms   [] Weakness or numbness in  legs Musculoskeletal:  [] Arthritis   [] Joint swelling   [x] Joint pain   [] Low back pain Hematologic:  [] Easy bruising  [] Easy bleeding   [] Hypercoagulable state   [] Anemic  [] Hepatitis Gastrointestinal:  [] Blood in stool   [] Vomiting blood  [] Gastroesophageal reflux/heartburn   [] Difficulty swallowing. Genitourinary:  [] Chronic kidney disease   [] Difficult urination  [] Frequent urination  [] Burning with urination   [] Blood in urine Skin:  [] Rashes   [] Ulcers   [] Wounds Psychological:  [] History of anxiety   []  History of major depression.  Physical Examination  Vitals:   11/05/16 0353 11/05/16 0830 11/05/16 0845 11/05/16 1536  BP: 121/64 122/68 126/72 119/64  Pulse: 76 82 80 86  Resp: 19 18  18   Temp: 97.5 F (36.4 C) 98.7 F (37.1 C)  98.6 F (37 C)  TempSrc: Oral Oral    SpO2: 98% 97%  96%  Weight:      Height:       Body mass index is 26.91 kg/m. Gen:  WD/WN, NAD Head: [place]/AT, No temporalis wasting. Prominent temp pulse not noted. Ear/Nose/Throat: Hearing grossly intact, nares w/o erythema or drainage, oropharynx w/o Erythema/Exudate Eyes: Sclera non-icteric, conjunctiva clear Neck: Trachea midline.  No JVD.  Pulmonary:  Good air movement, respirations not labored, equal bilaterally.  Cardiac: RRR, normal S1, S2. Vascular: Right lower extremity 3+ edema warm to the touch Vessel Right Left  Radial Palpable Palpable  Ulnar Palpable Palpable  Brachial Palpable Palpable  Carotid Palpable, without bruit Palpable, without bruit  Aorta Not palpable N/A  Femoral Palpable Palpable  Popliteal Palpable Palpable  PT 1+ Palpable 1+ Palpable  DP 1+ Palpable 1+ Palpable   Gastrointestinal: soft, non-tender/non-distended. No guarding/reflex.  Musculoskeletal: M/S 5/5 throughout.  Extremities without ischemic changes.  No deformity or atrophy. No edema. Neurologic: Sensation grossly intact in extremities.  Symmetrical.  Speech is fluent. Motor exam as listed above. Psychiatric:  Judgment intact, Mood & affect appropriate for pt's clinical situation. Dermatologic: No rashes or ulcers noted.  No cellulitis or open wounds. Lymph : No Cervical, Axillary, or Inguinal lymphadenopathy.     CBC Lab Results  Component Value Date   WBC 9.0 11/05/2016   HGB 11.5 (L) 11/05/2016   HCT 33.5 (L) 11/05/2016   MCV 94.2 11/05/2016   PLT 197 11/05/2016    BMET    Component Value Date/Time   NA 145 11/05/2016 0402   K 4.2 11/05/2016 0402   CL 110 11/05/2016 0402   CO2 27 11/05/2016 0402   GLUCOSE 94 11/05/2016 0402   BUN 54 (H) 11/05/2016 0402  CREATININE 1.45 (H) 11/05/2016 0402   CALCIUM 8.8 (L) 11/05/2016 0402   GFRNONAA 38 (L) 11/05/2016 0402   GFRAA 44 (L) 11/05/2016 0402   Estimated Creatinine Clearance: 26.9 mL/min (by C-G formula based on SCr of 1.45 mg/dL (H)).  COAG Lab Results  Component Value Date   INR 1.13 11/04/2016   INR 1.38 02/19/2016    Radiology Ct Angio Aortobifemoral W And/or Wo Contrast  Result Date: 11/04/2016 CLINICAL DATA:  Acute presentation with right leg redness and swelling beginning yesterday. Painful. EXAM: CT ANGIOGRAPHY AOBIFEM WITHOUT AND WITH CONTRAST<Procedure Description>CT ANGIOGRAPHY AOBIFEM WITHOUT AND WITH CONTRAST TECHNIQUE: CT angiography of the aorta and lower extremities. CONTRAST:  100 cc Isovue 370 COMPARISON:  10/04/2016 . FINDINGS: Right pleural effusion with right lower lobe atelectasis. Liver is normal except for 1 cm low-density in the right lobe likely to be benign. Present previously and unchanged. Multiple calcified gallstones. 1.5 cm low-density in the pancreatic body as seen previously. Spleen is normal. Adrenal glands are normal. Renal cysts as previously seen. Massive progression of retroperitoneal all lymphadenopathy and right inguinal and iliac adenopathy since the previous study. The differential diagnosis is lymphoma versus metastatic disease. This quite likely results and venous obstruction of the right  lower extremity. No primary bowel pathology. Bladder is unremarkable. Prostate gland is small. Chronic degenerative changes affect the spine. No destructive bone findings. There is diffuse aortic atherosclerosis. No aneurysm. Celiac origin shows 30% stenosis. Superior mesenteric artery origin is widely patent. No significant distal stenosis is seen. Bilateral renal artery atherosclerosis. Severe stenosis at the proximal right renal artery, possibly 80% or greater. 30% stenosis of the left renal artery. Inferior mesenteric artery is patent with 30% proximal stenosis. Diffuse iliac atherosclerosis without stenosis more than 25 or 30%. Both common femoral artery show atherosclerosis but no stenosis greater than approximately 30%. There is slower flow to the left leg than the right, but the femoral popliteal arteries in the lower leg runoff vessels are patent bilaterally to the foot. There is diffuse swelling in of the right lower extremity with edema probably secondary to venous obstruction. Review of the MIP images confirms the above findings. IMPRESSION: Right pleural effusion with right lower lobe atelectasis. Massive retroperitoneal, right iliac and right inguinal lymphadenopathy, progressive since the previous study and resulting in venous obstruction of the right lower extremity. Swelling and subcutaneous edema of the right lower extremity secondary to venous obstruction. This could be lymphoma or metastatic disease, with lymphoma favored. Aortic atherosclerosis. Atherosclerosis of the branch vessels. Severe stenosis of the right renal artery, 80% or greater. Slower arterial flow to the left leg relative to the right, but without significant lower extremity arterial occlusion or compromise on either side. Chololithiasis. No change in 1.5 cm pancreatic low-density. Electronically Signed   By: Nelson Chimes M.D.   On: 11/04/2016 19:23   US Biopsy  Result Date: 10/21/2016 CLINICAL DATA:  Right-sided inguinal,  pelvic and abdominal retroperitoneal lymphadenopathy. The patient presents for ultrasound-guided biopsy of enlarged right inguinal lymph nodes. EXAM: ULTRASOUND GUIDED CORE BIOPSY OF RIGHT INGUINAL LYMPH NODE MASS MEDICATIONS: 0.5 mg IV Versed; 25 mcg IV Fentanyl Total Moderate Sedation Time: 19 minutes. The patient's level of consciousness and physiologic status were continuously monitored during the procedure by Radiology nursing. PROCEDURE: The procedure, risks, benefits, and alternatives were explained to the patient. Questions regarding the procedure were encouraged and answered. The patient understands and consents to the procedure. A time out was performed prior to initiating the procedure. The  right groin region was prepped with chlorhexidine in a sterile fashion, and a sterile drape was applied covering the operative field. A sterile gown and sterile gloves were used for the procedure. Local anesthesia was provided with 1% Lidocaine. Ultrasound was used to localize right inguinal lymphadenopathy. Under direct ultrasound guidance, a total of 5 separate 16 gauge core biopsy samples were obtained in different portions of an enlarged lymph node mass in the lower right inguinal region extending into the proximal right thigh. Material was submitted on Telfa soaked in saline. COMPLICATIONS: None. FINDINGS: Multiple enlarged hypoechoic lymph nodes are identified in the right groin and proximal right thigh. Solid tissue was obtained from a dominant enlarged lymph node measuring approximately 7 cm in greatest length. IMPRESSION: Ultrasound-guided core biopsy performed of enlarged right inguinal lymph node. Electronically Signed   By: Aletta Edouard M.D.   On: 10/21/2016 10:29   US Venous Img Lower Unilateral Right  Result Date: 11/04/2016 CLINICAL DATA:  Right lower extremity pain and swelling for 2 weeks. EXAM: Right LOWER EXTREMITY VENOUS DOPPLER ULTRASOUND TECHNIQUE: Gray-scale sonography with graded  compression, as well as color Doppler and duplex ultrasound were performed to evaluate the lower extremity deep venous systems from the level of the common femoral vein and including the common femoral, femoral, profunda femoral, popliteal and calf veins including the posterior tibial, peroneal and gastrocnemius veins when visible. The superficial great saphenous vein was also interrogated. Spectral Doppler was utilized to evaluate flow at rest and with distal augmentation maneuvers in the common femoral, femoral and popliteal veins. COMPARISON:  None. FINDINGS: Contralateral Common Femoral Vein: Respiratory phasicity is normal and symmetric with the symptomatic side. No evidence of thrombus. Normal compressibility. Common Femoral Vein: Nonocclusive thrombus is noted. Partial flow is noted. Saphenofemoral Junction: No evidence of thrombus. Normal compressibility and flow on color Doppler imaging. Profunda Femoral Vein: No evidence of thrombus. Normal flow on color Doppler imaging. Unable to compress due to large mass in right groin. Femoral Vein: No evidence of thrombus. Normal respiratory phasicity and response to augmentation. Unable to compress due to large mass in right groin. Popliteal Vein: No evidence of thrombus. Normal compressibility, respiratory phasicity and response to augmentation. Calf Veins: No evidence of thrombus. Normal compressibility and flow on color Doppler imaging. Superficial Great Saphenous Vein: No evidence of thrombus. Normal compressibility and flow on color Doppler imaging. Venous Reflux:  None. Other Findings: Multiple large right inguinal lymph nodes are noted, with the largest measuring 8.5 x 5.8 x 4.8 cm. This lymph node has been recently biopsied. IMPRESSION: Acute nonocclusive deep venous thrombosis seen in right common femoral vein. Large right inguinal lymph node or mass is noted which has been previously biopsied. Critical Value/emergent results were called by telephone at the  time of interpretation on 11/04/2016 at 7:46 pm to Dr. Lenise Arena , who verbally acknowledged these results. Electronically Signed   By: Marijo Conception, M.D.   On: 11/04/2016 19:43      Assessment/Plan 1.  Right leg DVT:   patient's lymphadenopathy is compressing his venous system, potentially contributing to slow venous return and formation of his right lower extremity DVT. The heparin drip was initiated and he is tolerating anticoagulation. Given the compression on the IVC by his malignancy a filter is not indicated. As long as he is tolerating anticoagulation, continue with this treatment plan. Should further issues develop where he does not tolerate anticoagulation then a suprarenal filter would have to be placed. 2.  Renal artery  stenosis:  patient has significant blockage of his right renal artery, 80% on CTA. Given his excellent blood pressure and association with his age as well as his other comorbidities I do not recommend any further investigation or invasive treatments for his renal artery stenosis. This can be followed clinically and if he would develop poorly controlled hypertension that would be a sufficient indication for intervention.   3.  Lymphoma:  this is recently diagnosed with biopsy done about 2 weeks ago. Patient states he was unaware of the results. We discussed the diagnosis and we have ordered an oncology consult   4.  Atrial fibrillation:  rate controlled, continue home rate controlling medications   5.  BP (high blood pressure): currently normotensive, we will hold his home antihypertensives for now unless his blood pressure rises   6.  Type 2 diabetes mellitus: patient is not on medication at home for this. We will check his fingerstick every 8 hours for now, and if his glucose rises significantly we will order sliding scale insulin for coverage.  7.  CKD (chronic kidney disease), stage III: stable, avoid nephrotoxins and monitor    Hortencia Pilar,  MD  11/05/2016 6:41 PM    This note was created with Dragon medical transcription system.  Any error is purely unintentional

## 2016-11-05 NOTE — Plan of Care (Signed)
Problem: Education: Goal: Knowledge of [facility] Education information/materials will improve Outcome: Progressing Pt likes to be called Spencer Acosta  Past Medical History:  Diagnosis Date  . A-fib ([place])   . CKD (chronic kidney disease)   . Crohn's disease ([place])   . Diabetes mellitus without complication ([place])   . Dysrhythmia   . GI bleed   . Hypertension   . Hypertriglyceridemia 03/11/2014  . Lymphoma ([place])   . PVD (peripheral vascular disease) ([place])    Pt is well controlled with home medication

## 2016-11-06 LAB — CBC
HEMATOCRIT: 36.5 % — AB (ref 40.0–52.0)
HEMOGLOBIN: 12.3 g/dL — AB (ref 13.0–18.0)
MCH: 31.6 pg (ref 26.0–34.0)
MCHC: 33.6 g/dL (ref 32.0–36.0)
MCV: 94.2 fL (ref 80.0–100.0)
Platelets: 223 10*3/uL (ref 150–440)
RBC: 3.87 MIL/uL — ABNORMAL LOW (ref 4.40–5.90)
RDW: 14.6 % — ABNORMAL HIGH (ref 11.5–14.5)
WBC: 11.3 10*3/uL — ABNORMAL HIGH (ref 3.8–10.6)

## 2016-11-06 LAB — HEPARIN LEVEL (UNFRACTIONATED): HEPARIN UNFRACTIONATED: 0.62 [IU]/mL (ref 0.30–0.70)

## 2016-11-06 LAB — GLUCOSE, CAPILLARY
GLUCOSE-CAPILLARY: 80 mg/dL (ref 65–99)
GLUCOSE-CAPILLARY: 111 mg/dL — AB (ref 65–99)
Glucose-Capillary: 72 mg/dL (ref 65–99)

## 2016-11-06 MED ORDER — RIVAROXABAN (XARELTO) VTE STARTER PACK (15 & 20 MG): ORAL_TABLET | ORAL | 0 refills | Status: DC

## 2016-11-06 MED ORDER — RIVAROXABAN 15 MG PO TABS: 15.0000 mg | ORAL_TABLET | Freq: Two times a day (BID) | ORAL | Status: DC

## 2016-11-06 NOTE — Discharge Planning (Signed)
IV removed and wrapped.  Discharge papers given, explained and educated.  Informed of suggested FU appts and made.  Script sent to [employer] [facility]).  RN assessment and VS revealed stability for DC to [facility].  Left message at facility [facility]) to get ride for patient.  Awaiting for confirmation of ride.  When arrives, facility transporting via car.

## 2016-11-06 NOTE — Clinical Social Work Note (Signed)
Patient to be d/c'ed today back to [facility].  Patient will be transported via transportation services at [facility].  Evette Cristal, MSW Mon-Fri 8a-4:30p [phone]

## 2016-11-06 NOTE — Progress Notes (Signed)
ANTICOAGULATION CONSULT NOTE - Follow Up Consult  Pharmacy Consult for Heparin Indication: DVT  Allergies  Allergen Reactions  . Sulfa Antibiotics Nausea And Vomiting    Patient Measurements: Height: 5\' 8"  (172.7 cm) Weight: 177 lb (80.3 kg) IBW/kg (Calculated) : 68.4 Heparin Dosing Weight: 80.3 kg  Vital Signs: Temp: 98 F (36.7 C) (12/07 0352) Temp Source: Oral (12/07 0352) BP: 120/68 (12/07 0352) Pulse Rate: 82 (12/07 0352)  Labs:  Recent Labs  11/04/16 1242 11/05/16 0402 11/05/16 1155 11/06/16 0529  HGB 13.0 11.5*  --  12.3*  HCT 38.1* 33.5*  --  36.5*  PLT 221 197  --  223  APTT 30  --   --   --   LABPROT 14.6  --   --   --   INR 1.13  --   --   --   HEPARINUNFRC  --  0.57 0.59 0.62  CREATININE 1.35* 1.45*  --   --   TROPONINI <0.03  --   --   --     Estimated Creatinine Clearance: 26.9 mL/min (by C-G formula based on SCr of 1.45 mg/dL (H)).   Medications:  Infusions:  . heparin 1,100 Units/hr (11/05/16 1532)    Assessment: Pharmacy consulted to dose heparin for DVT in this 82 year old male with history of afib also c/o chest pain. Not on anticoagulation PTA.  12/6 1200 HL therapeutic at 0.59, 2nd consecutive therapeutic level.  Goal of Therapy:  Heparin level 0.3-0.7 units/ml Monitor platelets by anticoagulation protocol: Yes   Plan:  Will continue with current rate. Next Heparin level and CBC with AM labs.  12/7 0529 HL therapeutic. Continue current rate and daily monitoring.  Anelisse Jacobson A. Jordan Hawks, PharmD, BCPS 11/06/2016 6:41 AM

## 2016-11-06 NOTE — Care Management (Signed)
Free 30 day trail off for Xarelto given to Mr. Lonsway. This information will be given to Mr. Fish's Education officer, museum. Discharge to home today per Dr. Lavetta Nielsen. Shelbie Ammons RN MSN CCM Care Management

## 2016-11-06 NOTE — Discharge Summary (Signed)
[facility] [place] NAME: Spencer Acosta    MR#:  [id number]  DATE OF BIRTH:  [birth year]  DATE OF ADMISSION:  11/04/2016 ADMITTING PHYSICIAN: Lance Coon, MD  DATE OF DISCHARGE: 11/06/16  PRIMARY CARE PHYSICIAN: Kirk Ruths., MD    ADMISSION DIAGNOSIS:  Adenopathy [R59.1] Venous obstruction [I87.1] Atrial fibrillation, unspecified type ([place]) [I48.91]  DISCHARGE DIAGNOSIS:  Principal Problem:   Right leg DVT ([place]) Active Problems:   Atrial fibrillation (HCC)   BP (high blood pressure)   Type 2 diabetes mellitus (HCC)   Lymphoma (HCC)   CKD (chronic kidney disease), stage III   SECONDARY DIAGNOSIS:   Past Medical History:  Diagnosis Date  . A-fib ([place])   . CKD (chronic kidney disease)   . Crohn's disease ([place])   . Diabetes mellitus without complication ([place])   . Dysrhythmia   . GI bleed   . Hypertension   . Hypertriglyceridemia 03/11/2014  . Lymphoma ([place])   . PVD (peripheral vascular disease) [facility])     HOSPITAL COURSE:  Wadie Rodkey  is a 82 y.o. male admitted 11/04/2016 with chief complaint Leg Pain . Please see H&P performed by Lance Coon, MD for further information. Patient presented with the above symptoms, found to have right leg DVT in addition to renal artery stenosis. Started on heparin and transitioned to oral anticoagulation. Evaluated by vascular surgery - no indication for invasive procedures Outpatient appointment set up for oncology  DISCHARGE CONDITIONS:   stable  CONSULTS OBTAINED:  Treatment Team:  Katha Cabal, MD Lloyd Huger, MD  DRUG ALLERGIES:   Allergies  Allergen Reactions  . Sulfa Antibiotics Nausea And Vomiting    DISCHARGE MEDICATIONS:   Current Discharge Medication List    START taking these medications   Details  Rivaroxaban 15 & 20 MG TBPK Take as directed on package: Start with one 15mg  tablet by mouth twice a day with food. On Day 22, switch to one 20mg   tablet once a day with food. Qty: 51 each, Refills: 0      CONTINUE these medications which have NOT CHANGED   Details  acidophilus (RISAQUAD) CAPS capsule Take 1 capsule by mouth 2 (two) times daily. Qty: 60 capsule, Refills: 0    cholecalciferol (VITAMIN D) 1000 units tablet Take 1,000 Units by mouth daily.    latanoprost (XALATAN) 0.005 % ophthalmic solution Place 1 drop into both eyes at bedtime.    Mesalamine (ASACOL) 400 MG CPDR DR capsule Take 2 capsules (800 mg total) by mouth 3 (three) times daily. Qty: 180 capsule, Refills: 0    metoprolol tartrate (LOPRESSOR) 25 MG tablet Take 1 tablet (25 mg total) by mouth 2 (two) times daily. Qty: 60 tablet, Refills: 0    multivitamin-lutein (OCUVITE-LUTEIN) CAPS capsule Take 1 capsule by mouth daily.    predniSONE (DELTASONE) 20 MG tablet Take 3 tablets (60 mg total) by mouth daily. Qty: 12 tablet, Refills: 0    albuterol (PROVENTIL HFA;VENTOLIN HFA) 108 (90 Base) MCG/ACT inhaler Inhale 2 puffs into the lungs every 6 (six) hours as needed. Qty: 1 Inhaler, Refills: 0      STOP taking these medications     ampicillin (PRINCIPEN) 500 MG capsule      hydrochlorothiazide (HYDRODIURIL) 12.5 MG tablet          DISCHARGE INSTRUCTIONS:    DIET:  Regular diet  DISCHARGE CONDITION:  Stable  ACTIVITY:  Activity as tolerated  OXYGEN:  Home Oxygen:  No.   Oxygen Delivery: room air  DISCHARGE LOCATION:  home   If you experience worsening of your admission symptoms, develop shortness of breath, life threatening emergency, suicidal or homicidal thoughts you must seek medical attention immediately by calling 911 or calling your MD immediately  if symptoms less severe.  You Must read complete instructions/literature along with all the possible adverse reactions/side effects for all the Medicines you take and that have been prescribed to you. Take any new Medicines after you have completely understood and accpet all the possible  adverse reactions/side effects.   Please note  You were cared for by a hospitalist during your hospital stay. If you have any questions about your discharge medications or the care you received while you were in the hospital after you are discharged, you can call the unit and asked to speak with the hospitalist on call if the hospitalist that took care of you is not available. Once you are discharged, your primary care physician will handle any further medical issues. Please note that NO REFILLS for any discharge medications will be authorized once you are discharged, as it is imperative that you return to your primary care physician (or establish a relationship with a primary care physician if you do not have one) for your aftercare needs so that they can reassess your need for medications and monitor your lab values.    On the day of Discharge:   VITAL SIGNS:  Blood pressure 126/82, pulse 84, temperature 98.4 F (36.9 C), temperature source Oral, resp. rate 18, height 5\' 8"  (1.727 m), weight 80.3 kg (177 lb), SpO2 98 %.  I/O:   Intake/Output Summary (Last 24 hours) at 11/06/16 1046 Last data filed at 11/06/16 0903  Gross per 24 hour  Intake              516 ml  Output              350 ml  Net              166 ml    PHYSICAL EXAMINATION:  GENERAL:  82 y.o.-year-old patient lying in the bed with no acute distress.  EYES: Pupils equal, round, reactive to light and accommodation. No scleral icterus. Extraocular muscles intact.  HEENT: Head atraumatic, normocephalic. Oropharynx and nasopharynx clear.  NECK:  Supple, no jugular venous distention. No thyroid enlargement, no tenderness.  LUNGS: Normal breath sounds bilaterally, no wheezing, rales,rhonchi or crepitation. No use of accessory muscles of respiration.  CARDIOVASCULAR: S1, S2 normal. No murmurs, rubs, or gallops.  ABDOMEN: Soft, non-tender, non-distended. Bowel sounds present. No organomegaly or mass.  EXTREMITIES: No pedal  edema, cyanosis, or clubbing.  NEUROLOGIC: Cranial nerves II through XII are intact. Muscle strength 5/5 in all extremities. Sensation intact. Gait not checked.  PSYCHIATRIC: The patient is alert and oriented x 3.  SKIN: No obvious rash, lesion, or ulcer.   DATA REVIEW:   CBC  Recent Labs Lab 11/06/16 0529  WBC 11.3*  HGB 12.3*  HCT 36.5*  PLT 223    Chemistries   Recent Labs Lab 11/04/16 1242 11/05/16 0402  NA 144 145  K 4.0 4.2  CL 109 110  CO2 26 27  GLUCOSE 125* 94  BUN 60* 54*  CREATININE 1.35* 1.45*  CALCIUM 9.3 8.8*  AST 37  --   ALT 31  --   ALKPHOS 64  --   BILITOT 1.2  --     Cardiac Enzymes  Recent Labs Lab  11/04/16 [place] <0.03    Microbiology Results  Results for orders placed or performed during the hospital encounter of 11/04/16  MRSA PCR Screening     Status: None   Collection Time: 11/05/16 12:10 AM  Result Value Ref Range Status   MRSA by PCR NEGATIVE NEGATIVE Final    Comment:        The GeneXpert MRSA Assay (FDA approved for NASAL specimens only), is one component of a comprehensive MRSA colonization surveillance program. It is not intended to diagnose MRSA infection nor to guide or monitor treatment for MRSA infections.     RADIOLOGY:  Ct Angio Aortobifemoral W And/or Wo Contrast  Result Date: 11/04/2016 CLINICAL DATA:  Acute presentation with right leg redness and swelling beginning yesterday. Painful. EXAM: CT ANGIOGRAPHY AOBIFEM WITHOUT AND WITH CONTRAST<Procedure Description>CT ANGIOGRAPHY AOBIFEM WITHOUT AND WITH CONTRAST TECHNIQUE: CT angiography of the aorta and lower extremities. CONTRAST:  100 cc Isovue 370 COMPARISON:  10/04/2016 . FINDINGS: Right pleural effusion with right lower lobe atelectasis. Liver is normal except for 1 cm low-density in the right lobe likely to be benign. Present previously and unchanged. Multiple calcified gallstones. 1.5 cm low-density in the pancreatic body as seen previously.  Spleen is normal. Adrenal glands are normal. Renal cysts as previously seen. Massive progression of retroperitoneal all lymphadenopathy and right inguinal and iliac adenopathy since the previous study. The differential diagnosis is lymphoma versus metastatic disease. This quite likely results and venous obstruction of the right lower extremity. No primary bowel pathology. Bladder is unremarkable. Prostate gland is small. Chronic degenerative changes affect the spine. No destructive bone findings. There is diffuse aortic atherosclerosis. No aneurysm. Celiac origin shows 30% stenosis. Superior mesenteric artery origin is widely patent. No significant distal stenosis is seen. Bilateral renal artery atherosclerosis. Severe stenosis at the proximal right renal artery, possibly 80% or greater. 30% stenosis of the left renal artery. Inferior mesenteric artery is patent with 30% proximal stenosis. Diffuse iliac atherosclerosis without stenosis more than 25 or 30%. Both common femoral artery show atherosclerosis but no stenosis greater than approximately 30%. There is slower flow to the left leg than the right, but the femoral popliteal arteries in the lower leg runoff vessels are patent bilaterally to the foot. There is diffuse swelling in of the right lower extremity with edema probably secondary to venous obstruction. Review of the MIP images confirms the above findings. IMPRESSION: Right pleural effusion with right lower lobe atelectasis. Massive retroperitoneal, right iliac and right inguinal lymphadenopathy, progressive since the previous study and resulting in venous obstruction of the right lower extremity. Swelling and subcutaneous edema of the right lower extremity secondary to venous obstruction. This could be lymphoma or metastatic disease, with lymphoma favored. Aortic atherosclerosis. Atherosclerosis of the branch vessels. Severe stenosis of the right renal artery, 80% or greater. Slower arterial flow to the  left leg relative to the right, but without significant lower extremity arterial occlusion or compromise on either side. Chololithiasis. No change in 1.5 cm pancreatic low-density. Electronically Signed   By: Nelson Chimes M.D.   On: 11/04/2016 19:23   US Venous Img Lower Unilateral Right  Result Date: 11/04/2016 CLINICAL DATA:  Right lower extremity pain and swelling for 2 weeks. EXAM: Right LOWER EXTREMITY VENOUS DOPPLER ULTRASOUND TECHNIQUE: Gray-scale sonography with graded compression, as well as color Doppler and duplex ultrasound were performed to evaluate the lower extremity deep venous systems from the level of the common femoral vein and including the common femoral, femoral,  profunda femoral, popliteal and calf veins including the posterior tibial, peroneal and gastrocnemius veins when visible. The superficial great saphenous vein was also interrogated. Spectral Doppler was utilized to evaluate flow at rest and with distal augmentation maneuvers in the common femoral, femoral and popliteal veins. COMPARISON:  None. FINDINGS: Contralateral Common Femoral Vein: Respiratory phasicity is normal and symmetric with the symptomatic side. No evidence of thrombus. Normal compressibility. Common Femoral Vein: Nonocclusive thrombus is noted. Partial flow is noted. Saphenofemoral Junction: No evidence of thrombus. Normal compressibility and flow on color Doppler imaging. Profunda Femoral Vein: No evidence of thrombus. Normal flow on color Doppler imaging. Unable to compress due to large mass in right groin. Femoral Vein: No evidence of thrombus. Normal respiratory phasicity and response to augmentation. Unable to compress due to large mass in right groin. Popliteal Vein: No evidence of thrombus. Normal compressibility, respiratory phasicity and response to augmentation. Calf Veins: No evidence of thrombus. Normal compressibility and flow on color Doppler imaging. Superficial Great Saphenous Vein: No evidence of  thrombus. Normal compressibility and flow on color Doppler imaging. Venous Reflux:  None. Other Findings: Multiple large right inguinal lymph nodes are noted, with the largest measuring 8.5 x 5.8 x 4.8 cm. This lymph node has been recently biopsied. IMPRESSION: Acute nonocclusive deep venous thrombosis seen in right common femoral vein. Large right inguinal lymph node or mass is noted which has been previously biopsied. Critical Value/emergent results were called by telephone at the time of interpretation on 11/04/2016 at 7:46 pm to Dr. Lenise Arena , who verbally acknowledged these results. Electronically Signed   By: Marijo Conception, M.D.   On: 11/04/2016 19:43     Management plans discussed with the patient, family and they are in agreement.  CODE STATUS:     Code Status Orders        Start     Ordered   11/04/16 2327  Do not attempt resuscitation (DNR)  Continuous    Question Answer Comment  In the event of cardiac or respiratory ARREST Do not call a "code blue"   In the event of cardiac or respiratory ARREST Do not perform Intubation, CPR, defibrillation or ACLS   In the event of cardiac or respiratory ARREST Use medication by any route, position, wound care, and other measures to relive pain and suffering. May use oxygen, suction and manual treatment of airway obstruction as needed for comfort.      11/04/16 2326    Code Status History    Date Active Date Inactive Code Status Order ID Comments User Context   10/04/2016  8:29 PM 10/05/2016 12:55 AM DNR [id number]  Hinda Kehr, MD ED   02/19/2016  9:28 PM 02/22/2016  5:43 PM DNR [id number]  Gladstone Lighter, MD Inpatient    Advance Directive Documentation   Flowsheet Row Most Recent Value  Type of Advance Directive  Out of facility DNR (pink MOST or yellow form)  Pre-existing out of facility DNR order (yellow form or pink MOST form)  No data  "MOST" Form in Place?  No data      TOTAL TIME TAKING CARE OF THIS PATIENT: 33  minutes.    Hower,  Karenann Cai.D on 11/06/2016 at 10:46 AM  Between 7am to 6pm - Pager - [phone]  After 6pm go to [email or web address] - Proofreader  [facility]  Office  [phone]  CC: Primary care physician; Kirk Ruths., MD

## 2016-11-06 NOTE — Progress Notes (Signed)
  Oncology Nurse Navigator Documentation Received message from Dr. Alyson Ingles for new diagnosis low grade B cell Lymphoma. Referral sent to scheduling for appt.  Navigator Location: [facility] (11/06/16 0800) Referral date to RadOnc/MedOnc: 11/04/16 (11/06/16 0800) )Navigator Encounter Type: Other (11/06/16 0800)                                                    Time Spent with Patient: 15 (11/06/16 0800)

## 2016-11-11 NOTE — Progress Notes (Addendum)
[facility] day:  11/12/2016  Chief Complaint: Spencer Acosta is a 82 y.o. male with B cell lymphoma who is referred by Dr. Nicolette Bang for assessment and management.  HPI:  The patient initially presented to the [facility] ER on 10/04/2016 with intermittent blood in his stool.  Hemoglobin was normal.  Stool guaiac was negative.  Urine sample revealed gross hematuria which prompted a CT renal stone study.  CT renal stone study on 10/04/2016 revealed interval development of significant adenopathy of the right inguinal region, right iliac nodal chains and right side periaortic lymph nodes (prior scan 02/19/2016) . Lymph node of the right iliac nodal station measured 3.5 x 7.5 cm.  Nodal mass in the right inguinal region measured 5.2 x 7 cm.  He was referred to [facility].  He saw Dr. Nicolette Bang on 10/10/2016.  Urine culture from the ER grew enteroccoccus.  He was treated with ampicillin x 14 days.  Lymph node biopsy was scheduled.  Ultrasound guided core biopsy of a right inguinal node on 10/21/2016 revealed a low grade B cell lymphoma. The CD10 immunophenotype would favor follicular lymphoma.  Absence of discrete nodular architecture and follicular dendritic cell mesh works along with the plasmacytoid differentiation also raises the possibility of a marginal zone lymphoma.    The patient was admitted to [facility] from 11/04/2016 - 11/06/2016 with leg pain.  Right lower extremity duplex on 11/04/2016 revealed an acute nonocclusive deep venous thromboses in the right common femoral vein. There was a large right inguinal node measuring 8.5 x 5.8 x 4.8 cm.  CT angiography aortobifemoral with and without contrast on 11/04/2016 revealed right pleural effusion with right lower lobe atelectasis. There was massive retroperitoneal, right iliac and right inguinal lymphadenopathy and venous obstruction of the right lower extremity. There was swelling  and subcutaneous edema of the right lower extremity secondary to venous obstruction. There was severe stenosis of the right renal artery, 80% or greater.  There was slower arterial flow to the left leg relative to the right without significant lower extremity arterial occlusion or compromise.  He was started on heparin and transitioned to oral anticoagulation (Xarelto).  He was seen by vascular surgery.  No intervention was planned given his age and co-morbidities.  Symptomatically, he feels pretty good.  He denies any fever, sweats or weight loss.  He has chronic right lower extremity edema.   Past Medical History:  Diagnosis Date  . A-fib ([place])   . CKD (chronic kidney disease)   . Crohn's disease ([place])   . Diabetes mellitus without complication ([place])   . Dysrhythmia   . GI bleed   . Hypertension   . Hypertriglyceridemia 03/11/2014  . Lymphoma ([place])   . PVD (peripheral vascular disease) ([place])     Past Surgical History:  Procedure Laterality Date  . APPENDECTOMY    . TURP VAPORIZATION      Family History  Problem Relation Age of Onset  . Diabetes Other     Social History:  reports that he quit smoking about 69 years ago. He has never used smokeless tobacco. He reports that he does not drink alcohol or use drugs.  He served in [employer].  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  He denies any exposure to radiation or toxins.  He has a daughter in [place], another daughter in [place], and a son in [place].  [name] Snyder (ph:  [phone]), his daughter in [place], is his medical power if he can not speak for himself.  The patient has lived at [facility] for 18 years. He lives alone. The aide comes out 3 days a week for 2 hours and helps shop, clean the apartment, and wash clothes. On Wednesday, he gets a shower. The patient is accompanied by Lovie Chol, CNA from [facility] today.  Allergies:  Allergies  Allergen Reactions  . Sulfa Antibiotics Nausea And  Vomiting    Current Medications: Current Outpatient Prescriptions  Medication Sig Dispense Refill  . acidophilus (RISAQUAD) CAPS capsule Take 1 capsule by mouth 2 (two) times daily. 60 capsule 0  . albuterol (PROVENTIL HFA;VENTOLIN HFA) 108 (90 Base) MCG/ACT inhaler Inhale 2 puffs into the lungs every 6 (six) hours as needed. 1 Inhaler 0  . cholecalciferol (VITAMIN D) 1000 units tablet Take 1,000 Units by mouth daily.    [name] Kitchen latanoprost (XALATAN) 0.005 % ophthalmic solution Place 1 drop into both eyes at bedtime.    . Mesalamine (ASACOL) 400 MG CPDR DR capsule Take 2 capsules (800 mg total) by mouth 3 (three) times daily. 180 capsule 0  . metoprolol tartrate (LOPRESSOR) 25 MG tablet Take 1 tablet (25 mg total) by mouth 2 (two) times daily. 60 tablet 0  . multivitamin-lutein (OCUVITE-LUTEIN) CAPS capsule Take 1 capsule by mouth daily.    . predniSONE (DELTASONE) 20 MG tablet Take 3 tablets (60 mg total) by mouth daily. 12 tablet 0  . Rivaroxaban 15 & 20 MG TBPK Take as directed on package: Start with one 91m tablet by mouth twice a day with food. On Day 22, switch to one 238mtablet once a day with food. 51 each 0  . allopurinol (ZYLOPRIM) 300 MG tablet Take 1 tablet (300 mg total) by mouth daily. 30 tablet 2   No current facility-administered medications for this visit.     Review of Systems:  GENERAL:  Feels "pretty good".  No fevers, sweats or weight loss. PERFORMANCE STATUS (ECOG):  2 HEENT:  Decreased hearing.  Rare runny nose.  No visual changes, sore throat, mouth sores or tenderness. Lungs: No shortness of breath or cough.  No hemoptysis. Cardiac:  No chest pain, palpitations, orthopnea, or PND. GI:  No nausea, vomiting, diarrhea, constipation, melena or hematochezia.  Last colonoscopy "a while ago". GU:  No urgency, frequency, dysuria, or hematuria.  Urinates 3x/day. Musculoskeletal:  No back pain.  No joint pain.  No muscle tenderness. Extremities:  No pain or swelling. Skin:   No rashes or skin changes. Neuro:  No headache, numbness or weakness, balance or coordination issues. Endocrine:  No diabetes, thyroid issues, hot flashes or night sweats. Psych:  No mood changes, depression or anxiety. Pain:  No focal pain. Review of systems:  All other systems reviewed and found to be negative.  Physical Exam: Blood pressure 109/66, pulse (!) 53, temperature 97.3 F (36.3 C), temperature source Tympanic, weight 180 lb 7 oz (81.8 kg). GENERAL:  Well developed, well nourished, sitting comfortably in the exam room in no acute distress. MENTAL STATUS:  Alert and oriented to person, place and time. HEAD:  Wearing a black beret'.  Gray hair with slight mutache .  Normocephalic, atraumatic, face symmetric, no Cushingoid features. EYES:  Blue/brown eyes.  Pupils equal round and reactive to light and accomodation.  No conjunctivitis or scleral icterus. ENT:  Oropharynx clear without lesion.  Tongue normal. Mucous membranes moist.  RESPIRATORY:  Clear to auscultation without rales, wheezes or  rhonchi. CARDIOVASCULAR:  Regular rate and rhythm without murmur, rub or gallop. ABDOMEN:  Soft, non-tender, with active bowel sounds, and no appreciable hepatosplenomegaly.  No masses. SKIN:  No rashes, ulcers or lesions. EXTREMITIES: 3+ right lower extremity edema; 1+ left lower extremity edema.  No skin discoloration or tenderness.  No palpable cords. LYMPH NODES: Small axillary adenopathy  (right > left).  Matted right groin adenopathy.  No palpable cervical or supraclavicular adenopathy. NEUROLOGICAL: Unremarkable.  Gait slow and metered. PSYCH:  Appropriate.   Appointment on 11/12/2016  Component Date Value Ref Range Status  . WBC 11/12/2016 13.0* 3.8 - 10.6 K/uL Final  . RBC 11/12/2016 3.63* 4.40 - 5.90 MIL/uL Final  . Hemoglobin 11/12/2016 11.5* 13.0 - 18.0 g/dL Final  . HCT 11/12/2016 34.6* 40.0 - 52.0 % Final  . MCV 11/12/2016 95.3  80.0 - 100.0 fL Final  . MCH 11/12/2016 31.8   26.0 - 34.0 pg Final  . MCHC 11/12/2016 33.3  32.0 - 36.0 g/dL Final  . RDW 11/12/2016 14.7* 11.5 - 14.5 % Final  . Platelets 11/12/2016 235  150 - 440 K/uL Final  . Neutrophils Relative % 11/12/2016 78  % Final  . Neutro Abs 11/12/2016 10.2* 1.4 - 6.5 K/uL Final  . Lymphocytes Relative 11/12/2016 11  % Final  . Lymphs Abs 11/12/2016 1.4  1.0 - 3.6 K/uL Final  . Monocytes Relative 11/12/2016 11  % Final  . Monocytes Absolute 11/12/2016 1.4* 0.2 - 1.0 K/uL Final  . Eosinophils Relative 11/12/2016 0  % Final  . Eosinophils Absolute 11/12/2016 0.0  0 - 0.7 K/uL Final  . Basophils Relative 11/12/2016 0  % Final  . Basophils Absolute 11/12/2016 0.0  0 - 0.1 K/uL Final  . Sodium 11/12/2016 138  135 - 145 mmol/L Final  . Potassium 11/12/2016 3.8  3.5 - 5.1 mmol/L Final  . Chloride 11/12/2016 105  101 - 111 mmol/L Final  . CO2 11/12/2016 27  22 - 32 mmol/L Final  . Glucose, Bld 11/12/2016 185* 65 - 99 mg/dL Final  . BUN 11/12/2016 68* 6 - 20 mg/dL Final  . Creatinine, Ser 11/12/2016 1.21  0.61 - 1.24 mg/dL Final  . Calcium 11/12/2016 8.7* 8.9 - 10.3 mg/dL Final  . Total Protein 11/12/2016 6.2* 6.5 - 8.1 g/dL Final  . Albumin 11/12/2016 3.5  3.5 - 5.0 g/dL Final  . AST 11/12/2016 23  15 - 41 U/L Final  . ALT 11/12/2016 41  17 - 63 U/L Final  . Alkaline Phosphatase 11/12/2016 48  38 - 126 U/L Final  . Total Bilirubin 11/12/2016 0.9  0.3 - 1.2 mg/dL Final  . GFR calc non Af Amer 11/12/2016 48* >60 mL/min Final  . GFR calc Af Amer 11/12/2016 55* >60 mL/min Final   Comment: (NOTE) The eGFR has been calculated using the CKD EPI equation. This calculation has not been validated in all clinical situations. eGFR's persistently <60 mL/min signify possible Chronic Kidney Disease.   . Anion gap 11/12/2016 6  5 - 15 Final  . LDH 11/12/2016 186  98 - 192 U/L Final  . Uric Acid, Serum 11/12/2016 8.4* 4.4 - 7.6 mg/dL Final  . Hepatitis B Surface Ag 11/13/2016 Negative  Negative Final   Comment:  (NOTE) Performed At: [facility] [place], [place] [id number] Lindon Romp MD OI:[id number]   . Hep B Core Total Ab 11/13/2016 Negative  Negative Final   Comment: (NOTE) Performed At: [facility] [address]  [place], [place] [id number] Lindon Romp MD OH:[id number]   . HCV Ab 11/13/2016 <0.1  0.0 - 0.9 s/co ratio Final   Comment: (NOTE)                                  Negative:     < 0.8                             Indeterminate: 0.8 - 0.9                                  Positive:     > 0.9 The CDC recommends that a positive HCV antibody result be followed up with a HCV Nucleic Acid Amplification test ([id number]). Performed At: [facility] [address] [place], [place] [id number] Lindon Romp MD GH:[id number]     Assessment:  Spencer Acosta is a 82 y.o. male with at least stage III follicular lymphoma.  Ultrasound guided core biopsy of a right inguinal node on 10/21/2016 revealed a low grade B cell lymphoma. The CD10 immunophenotype would favor follicular lymphoma.  Absence of discrete nodular architecture and follicular dendritic cell mesh works along with the plasmacytoid differentiation also raises the possibility of a marginal zone lymphoma.    CT renal stone study on 10/04/2016 revealed interval development of significant adenopathy of the right inguinal region, right iliac nodal chains and right side periaortic lymph nodes (prior scan 02/19/2016) . Lymph node of the right iliac nodal station measured 3.5 x 7.5 cm.  Nodal mass in the right inguinal region measured 5.2 x 7 cm.  CT angiography aortobifemoral with and without contrast on 11/04/2016 revealed right pleural effusion with right lower lobe atelectasis. There was massive retroperitoneal, right iliac and right inguinal lymphadenopathy and venous obstruction of the right lower extremity. There was swelling and subcutaneous edema of the right lower extremity secondary to  venous obstruction. There was severe stenosis of the right renal artery, 80% or greater.  There was slower arterial flow to the left leg relative to the right without significant lower extremity arterial occlusion or compromise.  Right lower extremity duplex on 11/04/2016 revealed an acute nonocclusive deep venous thromboses in the right common femoral vein. There was a large right inguinal node measuring 8.5 x 5.8 x 4.8 cm.  He is on Xarelto.  He denies any B symptoms.  Exam reveals small axillary and moderate left inguinal adenopathy.  He has right lower extremity edema secondary to a DVT.  Plan: 1. Discuss diagnosis of lymphoma.  Exam and imaging studies suggest at least stage III disease.  Review images with patient.  Discuss obtaining chest CT without contrast to assess adenopathy above the diaphragm.  Discuss consideration of PET scan and bone marrow.  Follicular involvement of marrow would not change our treatment plan.  Given his age, would defer (patient and later daughter in agreement).  Discuss PET scan.  Benefit only if concern for documentation of high grade lymphoma (transformation).  Given his age and relatively frail state, would not pursue an anthracycline based regimen.  Patient in agreement about postponing PET scan at this point.  Discuss treatment of follicular lymphoma in the very elderly.  Would defer bendamustine and Rituxan (BR) as well as mini-RCHOP. Discussed Rituxan, Cytoxan, vincristine and prednisone (RCVP) every 3  weeks x 6 cycles.  Side effects reviewed.  Discussed Rituxan every 3 weeks + Cytoxan every 3 weeks x 6-8 weeks or Rituxan weekly x 4 weeks + daily chlorambucil x 6 weeks induction followed by maintenanace Rituxan + chlorambucil.  Discussed Rituxan weekly x4 followed by maintenance Rituxan x 2 years.  Discuss concern for bulky disease and decreased response rate with Rituxan alone.  Discuss issues with tumor lysis.  Discuss good hydration and allopurinol.  Discuss  use of rasburicase.  Discuss potential for renal dysfunction/failure.  2.  Discuss code status with patient.  Patient does not want to be intubated.  He is unsure if he wants CPR and chest compressions.  He does not want to be on dialysis. 3.  Labs today:  CBC with diff, CMP, LDH, uric acid, hepatitis B surface antigen, hepatitis B core antibody total, hepatitis C antibody, G6PD assay. 4.  Phone follow-up with patient's daughter in [place].  She will be participating in chemotherapy class on speaker phone and arrive this weekend for planned treatment next week. 5.  Nursing to assess veins for treatment. 6.  Chest CT without contrast 7.  Chemotherapy class 8.  RTC on 11/17/2016 in [place] for MD assessment, labs (CBC with diff, CMP, uric acid), and Rituxan +/- Cytoxan  Addendum:  Uric acid elevated.   Begin allopurinol 300 mg a day.   Lequita Asal, MD  11/12/2016

## 2016-11-12 ENCOUNTER — Other Ambulatory Visit: Payer: Self-pay | Admitting: *Deleted

## 2016-11-12 ENCOUNTER — Telehealth: Payer: Self-pay | Admitting: *Deleted

## 2016-11-12 ENCOUNTER — Inpatient Hospital Stay: Payer: Medicare Other

## 2016-11-12 ENCOUNTER — Inpatient Hospital Stay: Payer: Medicare Other | Attending: Hematology and Oncology | Admitting: Hematology and Oncology

## 2016-11-12 VITALS — BP 109/66 | HR 53 | Temp 97.3°F | Wt 180.4 lb

## 2016-11-12 DIAGNOSIS — K5090 Crohn's disease, unspecified, without complications: Secondary | ICD-10-CM | POA: Diagnosis not present

## 2016-11-12 DIAGNOSIS — E1122 Type 2 diabetes mellitus with diabetic chronic kidney disease: Secondary | ICD-10-CM | POA: Insufficient documentation

## 2016-11-12 DIAGNOSIS — Z66 Do not resuscitate: Secondary | ICD-10-CM | POA: Diagnosis not present

## 2016-11-12 DIAGNOSIS — I82411 Acute embolism and thrombosis of right femoral vein: Secondary | ICD-10-CM

## 2016-11-12 DIAGNOSIS — J90 Pleural effusion, not elsewhere classified: Secondary | ICD-10-CM | POA: Diagnosis not present

## 2016-11-12 DIAGNOSIS — C8225 Follicular lymphoma grade III, unspecified, lymph nodes of inguinal region and lower limb: Secondary | ICD-10-CM | POA: Diagnosis not present

## 2016-11-12 DIAGNOSIS — C8298 Follicular lymphoma, unspecified, lymph nodes of multiple sites: Secondary | ICD-10-CM

## 2016-11-12 DIAGNOSIS — N189 Chronic kidney disease, unspecified: Secondary | ICD-10-CM | POA: Diagnosis not present

## 2016-11-12 DIAGNOSIS — I129 Hypertensive chronic kidney disease with stage 1 through stage 4 chronic kidney disease, or unspecified chronic kidney disease: Secondary | ICD-10-CM | POA: Insufficient documentation

## 2016-11-12 DIAGNOSIS — I4891 Unspecified atrial fibrillation: Secondary | ICD-10-CM | POA: Diagnosis not present

## 2016-11-12 DIAGNOSIS — I701 Atherosclerosis of renal artery: Secondary | ICD-10-CM

## 2016-11-12 DIAGNOSIS — Z7901 Long term (current) use of anticoagulants: Secondary | ICD-10-CM | POA: Insufficient documentation

## 2016-11-12 DIAGNOSIS — Z87891 Personal history of nicotine dependence: Secondary | ICD-10-CM | POA: Insufficient documentation

## 2016-11-12 DIAGNOSIS — E781 Pure hyperglyceridemia: Secondary | ICD-10-CM | POA: Insufficient documentation

## 2016-11-12 DIAGNOSIS — Z79899 Other long term (current) drug therapy: Secondary | ICD-10-CM | POA: Diagnosis not present

## 2016-11-12 LAB — CBC WITH DIFFERENTIAL/PLATELET
Basophils Absolute: 0 10*3/uL (ref 0–0.1)
Basophils Relative: 0 %
Eosinophils Absolute: 0 10*3/uL (ref 0–0.7)
Eosinophils Relative: 0 %
HCT: 34.6 % — ABNORMAL LOW (ref 40.0–52.0)
Hemoglobin: 11.5 g/dL — ABNORMAL LOW (ref 13.0–18.0)
Lymphocytes Relative: 11 %
Lymphs Abs: 1.4 10*3/uL (ref 1.0–3.6)
MCH: 31.8 pg (ref 26.0–34.0)
MCHC: 33.3 g/dL (ref 32.0–36.0)
MCV: 95.3 fL (ref 80.0–100.0)
Monocytes Absolute: 1.4 10*3/uL — ABNORMAL HIGH (ref 0.2–1.0)
Monocytes Relative: 11 %
Neutro Abs: 10.2 10*3/uL — ABNORMAL HIGH (ref 1.4–6.5)
Neutrophils Relative %: 78 %
Platelets: 235 10*3/uL (ref 150–440)
RBC: 3.63 MIL/uL — ABNORMAL LOW (ref 4.40–5.90)
RDW: 14.7 % — ABNORMAL HIGH (ref 11.5–14.5)
WBC: 13 10*3/uL — ABNORMAL HIGH (ref 3.8–10.6)

## 2016-11-12 LAB — COMPREHENSIVE METABOLIC PANEL
ALT: 41 U/L (ref 17–63)
AST: 23 U/L (ref 15–41)
Albumin: 3.5 g/dL (ref 3.5–5.0)
Alkaline Phosphatase: 48 U/L (ref 38–126)
Anion gap: 6 (ref 5–15)
BUN: 68 mg/dL — ABNORMAL HIGH (ref 6–20)
CO2: 27 mmol/L (ref 22–32)
Calcium: 8.7 mg/dL — ABNORMAL LOW (ref 8.9–10.3)
Chloride: 105 mmol/L (ref 101–111)
Creatinine, Ser: 1.21 mg/dL (ref 0.61–1.24)
GFR calc Af Amer: 55 mL/min — ABNORMAL LOW (ref >60)
GFR calc non Af Amer: 48 mL/min — ABNORMAL LOW (ref >60)
Glucose, Bld: 185 mg/dL — ABNORMAL HIGH (ref 65–99)
Potassium: 3.8 mmol/L (ref 3.5–5.1)
Sodium: 138 mmol/L (ref 135–145)
Total Bilirubin: 0.9 mg/dL (ref 0.3–1.2)
Total Protein: 6.2 g/dL — ABNORMAL LOW (ref 6.5–8.1)

## 2016-11-12 LAB — LACTATE DEHYDROGENASE: LDH: 186 U/L (ref 98–192)

## 2016-11-12 LAB — URIC ACID: Uric Acid, Serum: 8.4 mg/dL — ABNORMAL HIGH (ref 4.4–7.6)

## 2016-11-12 MED ORDER — ALLOPURINOL 300 MG PO TABS: 300.0000 mg | ORAL_TABLET | Freq: Every day | ORAL | 2 refills | Status: DC

## 2016-11-12 NOTE — Patient Instructions (Signed)
Rituximab injection What is this medicine? RITUXIMAB (ri TUX i mab) is a monoclonal antibody. It is used to treat non-Hodgkin lymphoma and chronic lymphocytic leukemia. It is also used to treat rheumatoid arthritis (RA). In RA, this medicine slows the inflammatory process and help reduce joint pain and swelling. This medicine is often used with other cancer or arthritis medications. This medicine may be used for other purposes; ask your health care provider or pharmacist if you have questions. COMMON BRAND NAME(S): Rituxan What should I tell my health care provider before I take this medicine? They need to know if you have any of these conditions: -heart disease -infection (especially a virus infection such as hepatitis B, chickenpox, cold sores, or herpes) -immune system problems -irregular heartbeat -kidney disease -lung or breathing disease, like asthma -recently received or scheduled to receive a vaccine -an unusual or allergic reaction to rituximab, mouse proteins, other medicines, foods, dyes, or preservatives -pregnant or trying to get pregnant -breast-feeding How should I use this medicine? This medicine is for infusion into a vein. It is administered in a hospital or clinic by a specially trained health care professional. A special MedGuide will be given to you by the pharmacist with each prescription and refill. Be sure to read this information carefully each time. Talk to your pediatrician regarding the use of this medicine in children. This medicine is not approved for use in children. Overdosage: If you think you have taken too much of this medicine contact a poison control center or emergency room at once. NOTE: This medicine is only for you. Do not share this medicine with others. What if I miss a dose? It is important not to miss a dose. Call your doctor or health care professional if you are unable to keep an appointment. What may interact with this  medicine? -cisplatin -medicines for blood pressure -some other medicines for arthritis -vaccines This list may not describe all possible interactions. Give your health care provider a list of all the medicines, herbs, non-prescription drugs, or dietary supplements you use. Also tell them if you smoke, drink alcohol, or use illegal drugs. Some items may interact with your medicine. What should I watch for while using this medicine? Report any side effects that you notice during your treatment right away, such as changes in your breathing, fever, chills, dizziness or lightheadedness. These effects are more common with the first dose. Visit your prescriber or health care professional for checks on your progress. You will need to have regular blood work. Report any other side effects. The side effects of this medicine can continue after you finish your treatment. Continue your course of treatment even though you feel ill unless your doctor tells you to stop. Call your doctor or health care professional for advice if you get a fever, chills or sore throat, or other symptoms of a cold or flu. Do not treat yourself. This drug decreases your body's ability to fight infections. Try to avoid being around people who are sick. This medicine may increase your risk to bruise or bleed. Call your doctor or health care professional if you notice any unusual bleeding. Be careful brushing and flossing your teeth or using a toothpick because you may get an infection or bleed more easily. If you have any dental work done, tell your dentist you are receiving this medicine. Avoid taking products that contain aspirin, acetaminophen, ibuprofen, naproxen, or ketoprofen unless instructed by your doctor. These medicines may hide a fever. Do not become pregnant  while taking this medicine. Women should inform their doctor if they wish to become pregnant or think they might be pregnant. There is a potential for serious side effects  to an unborn child. Talk to your health care professional or pharmacist for more information. Do not breast-feed an infant while taking this medicine. What side effects may I notice from receiving this medicine? Side effects that you should report to your doctor or health care professional as soon as possible: -allergic reactions like skin rash, itching or hives, swelling of the face, lips, or tongue -low blood counts - this medicine may decrease the number of white blood cells, red blood cells and platelets. You may be at increased risk for infections and bleeding. -signs of infection - fever or chills, cough, sore throat, pain or difficulty passing urine -signs of decreased platelets or bleeding - bruising, pinpoint red spots on the skin, black, tarry stools, blood in the urine -signs of decreased red blood cells - unusually weak or tired, fainting spells, lightheadedness -breathing problems -confused, not responsive -chest pain -fast, irregular heartbeat -feeling faint or lightheaded, falls -mouth sores -redness, blistering, peeling or loosening of the skin, including inside the mouth -stomach pain -swelling of the ankles, feet, or hands -trouble passing urine or change in the amount of urine Side effects that usually do not require medical attention (report to your doctor or health care professional if they continue or are bothersome): -anxiety -headache -loss of appetite -muscle aches -nausea -night sweats This list may not describe all possible side effects. Call your doctor for medical advice about side effects. You may report side effects to FDA at 1-800-FDA-1088. Where should I keep my medicine? This drug is given in a hospital or clinic and will not be stored at home. NOTE: This sheet is a summary. It may not cover all possible information. If you have questions about this medicine, talk to your doctor, pharmacist, or health care provider.  2017 Elsevier/Gold Standard  (2016-05-29 17:23:26) Cyclophosphamide injection What is this medicine? CYCLOPHOSPHAMIDE (sye kloe FOSS fa mide) is a chemotherapy drug. It slows the growth of cancer cells. This medicine is used to treat many types of cancer like lymphoma, myeloma, leukemia, breast cancer, and ovarian cancer, to name a few. This medicine may be used for other purposes; ask your health care provider or pharmacist if you have questions. COMMON BRAND NAME(S): Cytoxan, Neosar What should I tell my health care provider before I take this medicine? They need to know if you have any of these conditions: -blood disorders -history of other chemotherapy -infection -kidney disease -liver disease -recent or ongoing radiation therapy -tumors in the bone marrow -an unusual or allergic reaction to cyclophosphamide, other chemotherapy, other medicines, foods, dyes, or preservatives -pregnant or trying to get pregnant -breast-feeding How should I use this medicine? This drug is usually given as an injection into a vein or muscle or by infusion into a vein. It is administered in a hospital or clinic by a specially trained health care professional. Talk to your pediatrician regarding the use of this medicine in children. Special care may be needed. Overdosage: If you think you have taken too much of this medicine contact a poison control center or emergency room at once. NOTE: This medicine is only for you. Do not share this medicine with others. What if I miss a dose? It is important not to miss your dose. Call your doctor or health care professional if you are unable to keep an appointment.  What may interact with this medicine? This medicine may interact with the following medications: -amiodarone -amphotericin B -azathioprine -certain antiviral medicines for HIV or AIDS such as protease inhibitors (e.g., indinavir, ritonavir) and zidovudine -certain blood pressure medications such as benazepril, captopril, enalapril,  fosinopril, lisinopril, moexipril, monopril, perindopril, quinapril, ramipril, trandolapril -certain cancer medications such as anthracyclines (e.g., daunorubicin, doxorubicin), busulfan, cytarabine, paclitaxel, pentostatin, tamoxifen, trastuzumab -certain diuretics such as chlorothiazide, chlorthalidone, hydrochlorothiazide, indapamide, metolazone -certain medicines that treat or prevent blood clots like warfarin -certain muscle relaxants such as succinylcholine -cyclosporine -etanercept -indomethacin -medicines to increase blood counts like filgrastim, pegfilgrastim, sargramostim -medicines used as general anesthesia -metronidazole -natalizumab This list may not describe all possible interactions. Give your health care provider a list of all the medicines, herbs, non-prescription drugs, or dietary supplements you use. Also tell them if you smoke, drink alcohol, or use illegal drugs. Some items may interact with your medicine. What should I watch for while using this medicine? Visit your doctor for checks on your progress. This drug may make you feel generally unwell. This is not uncommon, as chemotherapy can affect healthy cells as well as cancer cells. Report any side effects. Continue your course of treatment even though you feel ill unless your doctor tells you to stop. Drink water or other fluids as directed. Urinate often, even at night. In some cases, you may be given additional medicines to help with side effects. Follow all directions for their use. Call your doctor or health care professional for advice if you get a fever, chills or sore throat, or other symptoms of a cold or flu. Do not treat yourself. This drug decreases your body's ability to fight infections. Try to avoid being around people who are sick. This medicine may increase your risk to bruise or bleed. Call your doctor or health care professional if you notice any unusual bleeding. Be careful brushing and flossing your  teeth or using a toothpick because you may get an infection or bleed more easily. If you have any dental work done, tell your dentist you are receiving this medicine. You may get drowsy or dizzy. Do not drive, use machinery, or do anything that needs mental alertness until you know how this medicine affects you. Do not become pregnant while taking this medicine or for 1 year after stopping it. Women should inform their doctor if they wish to become pregnant or think they might be pregnant. Men should not father a child while taking this medicine and for 4 months after stopping it. There is a potential for serious side effects to an unborn child. Talk to your health care professional or pharmacist for more information. Do not breast-feed an infant while taking this medicine. This medicine may interfere with the ability to have a child. This medicine has caused ovarian failure in some women. This medicine has caused reduced sperm counts in some men. You should talk with your doctor or health care professional if you are concerned about your fertility. If you are going to have surgery, tell your doctor or health care professional that you have taken this medicine. What side effects may I notice from receiving this medicine? Side effects that you should report to your doctor or health care professional as soon as possible: -allergic reactions like skin rash, itching or hives, swelling of the face, lips, or tongue -low blood counts - this medicine may decrease the number of white blood cells, red blood cells and platelets. You may be at increased risk for  infections and bleeding. -signs of infection - fever or chills, cough, sore throat, pain or difficulty passing urine -signs of decreased platelets or bleeding - bruising, pinpoint red spots on the skin, black, tarry stools, blood in the urine -signs of decreased red blood cells - unusually weak or tired, fainting spells, lightheadedness -breathing  problems -dark urine -dizziness -palpitations -swelling of the ankles, feet, hands -trouble passing urine or change in the amount of urine -weight gain -yellowing of the eyes or skin Side effects that usually do not require medical attention (report to your doctor or health care professional if they continue or are bothersome): -changes in nail or skin color -hair loss -missed menstrual periods -mouth sores -nausea, vomiting This list may not describe all possible side effects. Call your doctor for medical advice about side effects. You may report side effects to FDA at 1-800-FDA-1088. Where should I keep my medicine? This drug is given in a hospital or clinic and will not be stored at home. NOTE: This sheet is a summary. It may not cover all possible information. If you have questions about this medicine, talk to your doctor, pharmacist, or health care provider.  2017 Elsevier/Gold Standard (2012-10-01 16:22:58)

## 2016-11-12 NOTE — Progress Notes (Signed)
Patient here today as new evaluation regarding B-Cell lymphoma  Referred by Dr. Alyson Ingles.

## 2016-11-12 NOTE — Telephone Encounter (Signed)
Daughter [name] informed of patients appointments and that the patient needs to drink more fluids as his BUN is elevated.  Daughter reports that she will inform her father to drink more fluid and she will be with him for his appointments next week.

## 2016-11-12 NOTE — Telephone Encounter (Signed)
Allopurinol 300 mg po q day ordered by dr. Mike Gip.  Pt notified of medication and increase po fluid intake, voiced understanding.  Message left with Education officer, [employer] [place] r/t medication and more po fluids for her to follow up.

## 2016-11-13 ENCOUNTER — Inpatient Hospital Stay: Payer: Medicare Other

## 2016-11-13 ENCOUNTER — Encounter: Payer: Self-pay | Admitting: Hematology and Oncology

## 2016-11-13 ENCOUNTER — Encounter: Payer: Self-pay | Admitting: Interventional Radiology

## 2016-11-13 LAB — HEPATITIS B SURFACE ANTIGEN: Hepatitis B Surface Ag: NEGATIVE

## 2016-11-13 LAB — GLUCOSE 6 PHOSPHATE DEHYDROGENASE
G6PDH: 10.7 U/g{Hb} (ref 4.6–13.5)
Hemoglobin: 11.3 g/dL — ABNORMAL LOW (ref 13.0–17.7)

## 2016-11-13 LAB — HEPATITIS B CORE ANTIBODY, TOTAL: Hep B Core Total Ab: NEGATIVE

## 2016-11-13 LAB — HEPATITIS C ANTIBODY: HCV Ab: <0.1 s/co ratio (ref 0.0–0.9)

## 2016-11-13 NOTE — Progress Notes (Signed)
START ON PATHWAY REGIMEN - Lymphoma and CLL  LYOS201: Rituximab 375 mg/m2 Weekly x 4 Weeks   Administer weekly:     Rituximab (Rituxan(R)) 375 mg/m2 in _____ mL NS IV days 1, 8, 15, 22.  Initiate first dose at a rate of 50 mg/hr.  In the absence of infusion toxicity, increase infusion rate by 50 mg/hr increments every 30 minutes, to a maximum of 400 mg/hr.  For  follicular and DLBC lymphoma patients see "Rapid infusion protocol" link for more information about accelerating the infusion time of rituximab. Dose Mod: None Additional Orders: Hepatitis B&C testing recommended prior to rituximab use on all patients. Final concentration of rituximab must be between 1 and 4 mg/ml.  **Always confirm dose/schedule in your pharmacy ordering system**    Patient Characteristics: Follicular, Grades 1, 2, and 3A, First Line, Stage III / IV, Symptomatic or Bulky Disease Disease Type: Follicular Disease Type: Not Applicable Grade: Grades 1, 2, and 3A [place] Stage: IIIB Line of therapy: First Line Disease Characteristics: Symptomatic or Bulky Disease  Intent of Therapy: Non-Curative / Palliative Intent, Discussed with Patient

## 2016-11-14 ENCOUNTER — Ambulatory Visit
Admission: RE | Admit: 2016-11-14 | Discharge: 2016-11-14 | Disposition: A | Discharge status: Home / Self Care | Payer: Medicare Other | Source: Ambulatory Visit | Attending: Hematology and Oncology | Admitting: Hematology and Oncology

## 2016-11-14 DIAGNOSIS — I517 Cardiomegaly: Secondary | ICD-10-CM | POA: Insufficient documentation

## 2016-11-14 DIAGNOSIS — I2510 Atherosclerotic heart disease of native coronary artery without angina pectoris: Secondary | ICD-10-CM | POA: Insufficient documentation

## 2016-11-14 DIAGNOSIS — R590 Localized enlarged lymph nodes: Secondary | ICD-10-CM | POA: Insufficient documentation

## 2016-11-14 DIAGNOSIS — I700 Atherosclerosis of aorta: Secondary | ICD-10-CM | POA: Diagnosis not present

## 2016-11-14 DIAGNOSIS — J90 Pleural effusion, not elsewhere classified: Secondary | ICD-10-CM | POA: Diagnosis not present

## 2016-11-14 DIAGNOSIS — C8298 Follicular lymphoma, unspecified, lymph nodes of multiple sites: Secondary | ICD-10-CM

## 2016-11-15 NOTE — Progress Notes (Signed)
[facility] day:  11/17/2016   Chief Complaint: Spencer Acosta is a 82 y.o. male with stage II bulky follicular lymphoma who is seen for assessment prior to initiation of chemotherapy.  HPI:  The patient was last seen in the medical oncology on 11/12/2016. At that time he was seen for initial assessment.  Abdominal CT had revealed bulky adenopathy resulting in right lower extremity venous obstruction and DVT in the right femoral vein.   Given his age, decision was made to forgo bone marrow aspirate and biopsy as it would not change his treatment.  Chest CT scan was scheduled.  Labs revealed a hematocrit of 34.6 and a hemoglobin of 11.5.  Uric acid was 8.4.  He was started on allopurinol 300 mg a day.  Ria Bush discussed treatment options.  We discussed additional staging studies.  Patient opted no to pursue a bone marrow as it would not change his treatment.  As he had already had an abdominal/pelvic CT scan, decision was made to pursue chest CT only.  Chest CT scan on 11/14/2016 revealed no significant thoracic adenopathy.  There was cardiac enlargement, aortic atherosclerosis and multi vessel coronary artery calcification.  There was persistent right pleural effusion and right lower lobe airspace consolidation.  There was upper abdominal adenopathy.  He attended the chemotherapy class on 11/13/2016.  Symptomatically, he has had a bad cough for the past 2 days.  He has had no fever.  He notes stress urinary incontinence.  He has an appointment with urology today.   Past Medical History:  Diagnosis Date  . A-fib ([place])   . CKD (chronic kidney disease)   . Crohn's disease ([place])   . Diabetes mellitus without complication ([place])   . Dysrhythmia   . GI bleed   . Hypertension   . Hypertriglyceridemia 03/11/2014  . Lymphoma ([place])   . PVD (peripheral vascular disease) ([place])     Past Surgical History:  Procedure Laterality Date  . APPENDECTOMY    . TURP  VAPORIZATION      Family History  Problem Relation Age of Onset  . Diabetes Other     Social History:  reports that he quit smoking about 69 years ago. He has never used smokeless tobacco. He reports that he does not drink alcohol or use drugs.  He served in [employer].  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  He denies any exposure to radiation or toxins.  He has a daughter in [place], another daughter in [place], and a son in [place].  [name] (ph:  [phone]), his daughter in [place], is his medical power if he can not speak for himself.  The patient has lived at [facility] for 18 years. He lives alone. The aide comes out 3 days a week for 2 hours and helps shop, clean the apartment, and wash clothes. On Wednesday, he gets a shower. The patient is accompanied by Clare Gandy, his daughter from [place], today.  Allergies:  Allergies  Allergen Reactions  . Sulfa Antibiotics Nausea And Vomiting    Current Medications: Current Outpatient Prescriptions  Medication Sig Dispense Refill  . acidophilus (RISAQUAD) CAPS capsule Take 1 capsule by mouth 2 (two) times daily. 60 capsule 0  . albuterol (PROVENTIL HFA;VENTOLIN HFA) 108 (90 Base) MCG/ACT inhaler Inhale 2 puffs into the lungs every 6 (six) hours as needed. 1 Inhaler 0  . allopurinol (ZYLOPRIM) 300 MG tablet Take 1 tablet (  300 mg total) by mouth daily. 30 tablet 2  . cholecalciferol (VITAMIN D) 1000 units tablet Take 1,000 Units by mouth daily.    [name] Kitchen latanoprost (XALATAN) 0.005 % ophthalmic solution Place 1 drop into both eyes at bedtime.    . Mesalamine (ASACOL) 400 MG CPDR DR capsule Take 2 capsules (800 mg total) by mouth 3 (three) times daily. 180 capsule 0  . metoprolol tartrate (LOPRESSOR) 25 MG tablet Take 1 tablet (25 mg total) by mouth 2 (two) times daily. 60 tablet 0  . multivitamin-lutein (OCUVITE-LUTEIN) CAPS capsule Take 1 capsule by mouth daily.    . predniSONE (DELTASONE) 20 MG tablet Take 3 tablets  (60 mg total) by mouth daily. 12 tablet 0  . Rivaroxaban 15 & 20 MG TBPK Take as directed on package: Start with one 76m tablet by mouth twice a day with food. On Day 22, switch to one 281mtablet once a day with food. 51 each 0   No current facility-administered medications for this visit.     Review of Systems:  GENERAL:  Feels "good".  No fevers or sweats.  Weight gain of 8 pounds. PERFORMANCE STATUS (ECOG):  2 HEENT:  Decreased hearing.  No visual changes, sore throat, mouth sores or tenderness. Lungs: No shortness of breath.  Cough.  No hemoptysis. Cardiac:  No chest pain, palpitations, orthopnea, or PND. GI:  No nausea, vomiting, diarrhea, constipation, or melena.  Blood in stool.  Last colonoscopy "a while ago". GU:  Stress urinary incontinence.  No urgency, frequency, dysuria, or hematuria.  Urinates 3x/day. Musculoskeletal:  No back pain.  No joint pain.  No muscle tenderness. Extremities:  No pain or swelling. Skin:  No rashes or skin changes. Neuro:  No headache, numbness or weakness, balance or coordination issues. Endocrine:  No diabetes, thyroid issues, hot flashes or night sweats. Psych:  No mood changes, depression or anxiety. Pain:  No focal pain. Review of systems:  All other systems reviewed and found to be negative.  Physical Exam: Blood pressure (!) 144/83, pulse 87, temperature 97.4 F (36.3 C), temperature source Tympanic, resp. rate 18, weight 186 lb 1.1 oz (84.4 kg). GENERAL:  Well developed, well nourished, sitting comfortably in the exam room in no acute distress. MENTAL STATUS:  Alert and oriented to person, place and time. HEAD:  Wearing a black beret'.  Gray hair with slight mutache .  Normocephalic, atraumatic, face symmetric, no Cushingoid features. EYES:  Blue/brown eyes.  Pupils equal round and reactive to light and accomodation.  No conjunctivitis or scleral icterus. ENT:  Oropharynx clear without lesion.  Tongue normal. Mucous membranes moist.   RESPIRATORY:  Faint right lower lobe crackles.  Otherwise, clear to auscultation without wheezes or rhonchi. CARDIOVASCULAR:  Regular rate and rhythm without murmur, rub or gallop. ABDOMEN:  Soft, non-tender, with active bowel sounds, and no appreciable hepatosplenomegaly.  No masses. SKIN:  No rashes, ulcers or lesions. EXTREMITIES: 3+ right lower extremity edema; 1+ left lower extremity edema.  No skin discoloration or tenderness.  No palpable cords. LYMPH NODES: Small axillary adenopathy.  Matted right groin adenopathy.  No palpable cervical or supraclavicular adenopathy. NEUROLOGICAL: Unremarkable.  Gait slow and metered. PSYCH:  Appropriate.   Appointment on 11/17/2016  Component Date Value Ref Range Status  . WBC 11/17/2016 15.1* 3.8 - 10.6 K/uL Final  . RBC 11/17/2016 3.53* 4.40 - 5.90 MIL/uL Final  . Hemoglobin 11/17/2016 11.3* 13.0 - 18.0 g/dL Final  . HCT 11/17/2016 33.5* 40.0 - 52.0 %  Final  . MCV 11/17/2016 94.8  80.0 - 100.0 fL Final  . MCH 11/17/2016 31.9  26.0 - 34.0 pg Final  . MCHC 11/17/2016 33.6  32.0 - 36.0 g/dL Final  . RDW 11/17/2016 15.5* 11.5 - 14.5 % Final  . Platelets 11/17/2016 237  150 - 440 K/uL Final  . Neutrophils Relative % 11/17/2016 83  % Final  . Neutro Abs 11/17/2016 12.5* 1.4 - 6.5 K/uL Final  . Lymphocytes Relative 11/17/2016 8  % Final  . Lymphs Abs 11/17/2016 1.2  1.0 - 3.6 K/uL Final  . Monocytes Relative 11/17/2016 9  % Final  . Monocytes Absolute 11/17/2016 1.3* 0.2 - 1.0 K/uL Final  . Eosinophils Relative 11/17/2016 0  % Final  . Eosinophils Absolute 11/17/2016 0.0  0 - 0.7 K/uL Final  . Basophils Relative 11/17/2016 0  % Final  . Basophils Absolute 11/17/2016 0.0  0 - 0.1 K/uL Final  . Sodium 11/17/2016 143  135 - 145 mmol/L Final  . Potassium 11/17/2016 3.9  3.5 - 5.1 mmol/L Final  . Chloride 11/17/2016 110  101 - 111 mmol/L Final  . CO2 11/17/2016 27  22 - 32 mmol/L Final  . Glucose, Bld 11/17/2016 177* 65 - 99 mg/dL Final  . BUN  11/17/2016 64* 6 - 20 mg/dL Final  . Creatinine, Ser 11/17/2016 1.18  0.61 - 1.24 mg/dL Final  . Calcium 11/17/2016 8.5* 8.9 - 10.3 mg/dL Final  . Total Protein 11/17/2016 6.2* 6.5 - 8.1 g/dL Final  . Albumin 11/17/2016 3.5  3.5 - 5.0 g/dL Final  . AST 11/17/2016 34  15 - 41 U/L Final  . ALT 11/17/2016 69* 17 - 63 U/L Final  . Alkaline Phosphatase 11/17/2016 47  38 - 126 U/L Final  . Total Bilirubin 11/17/2016 0.8  0.3 - 1.2 mg/dL Final  . GFR calc non Af Amer 11/17/2016 49* >60 mL/min Final  . GFR calc Af Amer 11/17/2016 57* >60 mL/min Final   Comment: (NOTE) The eGFR has been calculated using the CKD EPI equation. This calculation has not been validated in all clinical situations. eGFR's persistently <60 mL/min signify possible Chronic Kidney Disease.   . Anion gap 11/17/2016 6  5 - 15 Final  . Uric Acid, Serum 11/17/2016 5.6  4.4 - 7.6 mg/dL Final    Assessment:  Spencer Acosta is a 82 y.o. male with at least stage II follicular lymphoma.  Ultrasound guided core biopsy of a right inguinal node on 10/21/2016 revealed a low grade B cell lymphoma. The CD10 immunophenotype would favor follicular lymphoma.  Absence of discrete nodular architecture and follicular dendritic cell mesh works along with the plasmacytoid differentiation also raises the possibility of a marginal zone lymphoma.    CT renal stone study on 10/04/2016 revealed interval development of significant adenopathy of the right inguinal region, right iliac nodal chains and right side periaortic lymph nodes (prior scan 02/19/2016) . Lymph node of the right iliac nodal station measured 3.5 x 7.5 cm.  Nodal mass in the right inguinal region measured 5.2 x 7 cm.  CT angiography aortobifemoral with and without contrast on 11/04/2016 revealed right pleural effusion with right lower lobe atelectasis. There was massive retroperitoneal, right iliac and right inguinal lymphadenopathy and venous obstruction of the right lower extremity. There  was swelling and subcutaneous edema of the right lower extremity secondary to venous obstruction. There was severe stenosis of the right renal artery, 80% or greater.  There was slower arterial flow to the  left leg relative to the right without significant lower extremity arterial occlusion or compromise.  Chest CT scan on 11/14/2016 revealed no significant thoracic adenopathy.  There was persistent right pleural effusion and right lower lobe airspace consolidation.  There was upper abdominal adenopathy.  Right lower extremity duplex on 11/04/2016 revealed an acute nonocclusive deep venous thromboses in the right common femoral vein. There was a large right inguinal node measuring 8.5 x 5.8 x 4.8 cm.  He is on Xarelto.  Negative studies on 11/12/2016 included:  hepatitis B core antibody, hepatitis B surface antigen and hepatitis C antibody.  G6PD assay was normal on 11/12/2012.  He is on allopurinol.  Code status:  DNR.  No intubation, CPR and cardioversion.  No dialysis.  Symptomatically, he has a cough.  Exam reveals few RLL crackles.  He has moderate left inguinal adenopathy and right lower extremity edema secondary to a DVT.  Plan: 1. Review initial labs and scans.  Discuss at least stage II bulky disease.  Discuss treatment options.  Discuss single agent Rituxan likely less effective given bulky disease.  Discuss plan for chemotherapy with Rituxan maintenance (weekly x 4 every 6 months x 2 years) for responsive disease.  Discuss data from the chlorambucil and Rituxan trial for elderly patients.  There was 63% complete remission and 26% partial remission. Treatment was well tolerated. Two patients developed herpes zoster (consider acyclovir prophylaxis).  Results from the RCVP versus CVP trial were discussed. Response rates improved with the addition of Rituxan from 57% to 81% (complete remission 10% to 41%).  Side effects of therapy were reviewed.  2.  Discuss plan for Rituxan weekly x 4 +  chlorambucil 6 mg/m2/day x 6 weeks induction (total 42 day cycle) followed by Rituxan every 28 days x 4 plus chlorambucil 6 mg/m2/day on days 1-14.  BSA = 2 m2.  CrCl 32-36 ml/min.  Chlorambucil dose = 9 mg (75% dosing) = 10 mg 3 days/week + 8 mg 4 days/week..  Plan to begin chlorambucil 8 mg a day x 6 weeks and assess tolerance to therapy.  Check counts weekly.  Side effects reviewed.  Information provided.  Patient's daughter notes no outpatient drug coverage.  Will determine cost (Rx written).  Will need oral anti-emetic (ondansetron)- also concern for cost.  3.  Review tumor lysis syndrome.  Discuss good hydration and allopurinol.  Discuss use of rasburicase.  Discuss potential for renal dysfunction/failure.  Patient declines dialysis if felt needed. 4.  Review code status with patient.  Patient's daughter notes patient has a DNR code status.  Reviewed with patient.  Patient in agreement. 5.  Discuss results from recent chest CT.  No significant adenopathy.  Concern for early right lower lobe pneumonia.  Discuss antibiotics patient has been on in the past.  He has a history of C difficile.  Discuss initiation of azithromycin.  Patient to contact clinic if any worsening symptoms. 6.  Labs today:  CBC with diff, CMP, LDH, uric acid. 7.  RTC tomorrow for Rituxan. 8.  RTC on 11/21/2016 for MD assessment (Dr Janese Banks) on antibiotics + tumor lysis labs (BMP, uric acid) 9.  RTC on 11/25/2016 for MD assessment (Dr Janese Banks), labs (CBC with diff, CMP, uric acid), week #2 Rituxan, and possible addition of daily chlorambucil. 10.  RTC on 12/02/2016 for MD assessment, labs (CBC with diff, BMP, uric acid), and week #3 Rituxan.   Lequita Asal, MD  11/17/2016, 9:36 AM

## 2016-11-17 ENCOUNTER — Inpatient Hospital Stay: Payer: Medicare Other

## 2016-11-17 ENCOUNTER — Inpatient Hospital Stay (HOSPITAL_BASED_OUTPATIENT_CLINIC_OR_DEPARTMENT_OTHER): Payer: Medicare Other | Admitting: Hematology and Oncology

## 2016-11-17 ENCOUNTER — Encounter: Payer: Self-pay | Admitting: Urology

## 2016-11-17 ENCOUNTER — Ambulatory Visit: Payer: Medicare Other | Admitting: Hematology and Oncology

## 2016-11-17 ENCOUNTER — Ambulatory Visit (INDEPENDENT_AMBULATORY_CARE_PROVIDER_SITE_OTHER): Payer: Medicare Other | Admitting: Urology

## 2016-11-17 VITALS — BP 130/79 | HR 88 | Ht 68.0 in | Wt 184.0 lb

## 2016-11-17 VITALS — BP 144/83 | HR 87 | Temp 97.4°F | Resp 18 | Wt 186.1 lb

## 2016-11-17 DIAGNOSIS — C8225 Follicular lymphoma grade III, unspecified, lymph nodes of inguinal region and lower limb: Secondary | ICD-10-CM

## 2016-11-17 DIAGNOSIS — J90 Pleural effusion, not elsewhere classified: Secondary | ICD-10-CM | POA: Diagnosis not present

## 2016-11-17 DIAGNOSIS — I701 Atherosclerosis of renal artery: Secondary | ICD-10-CM

## 2016-11-17 DIAGNOSIS — J181 Lobar pneumonia, unspecified organism: Secondary | ICD-10-CM

## 2016-11-17 DIAGNOSIS — N281 Cyst of kidney, acquired: Secondary | ICD-10-CM | POA: Diagnosis not present

## 2016-11-17 DIAGNOSIS — N3001 Acute cystitis with hematuria: Secondary | ICD-10-CM | POA: Diagnosis not present

## 2016-11-17 DIAGNOSIS — N3000 Acute cystitis without hematuria: Secondary | ICD-10-CM | POA: Insufficient documentation

## 2016-11-17 DIAGNOSIS — Z7901 Long term (current) use of anticoagulants: Secondary | ICD-10-CM | POA: Diagnosis not present

## 2016-11-17 DIAGNOSIS — Z79899 Other long term (current) drug therapy: Secondary | ICD-10-CM

## 2016-11-17 DIAGNOSIS — J189 Pneumonia, unspecified organism: Secondary | ICD-10-CM

## 2016-11-17 DIAGNOSIS — R590 Localized enlarged lymph nodes: Secondary | ICD-10-CM | POA: Diagnosis not present

## 2016-11-17 DIAGNOSIS — C8298 Follicular lymphoma, unspecified, lymph nodes of multiple sites: Secondary | ICD-10-CM

## 2016-11-17 LAB — CBC WITH DIFFERENTIAL/PLATELET
Basophils Absolute: 0 10*3/uL (ref 0–0.1)
Basophils Relative: 0 %
Eosinophils Absolute: 0 10*3/uL (ref 0–0.7)
Eosinophils Relative: 0 %
HCT: 33.5 % — ABNORMAL LOW (ref 40.0–52.0)
Hemoglobin: 11.3 g/dL — ABNORMAL LOW (ref 13.0–18.0)
Lymphocytes Relative: 8 %
Lymphs Abs: 1.2 10*3/uL (ref 1.0–3.6)
MCH: 31.9 pg (ref 26.0–34.0)
MCHC: 33.6 g/dL (ref 32.0–36.0)
MCV: 94.8 fL (ref 80.0–100.0)
Monocytes Absolute: 1.3 10*3/uL — ABNORMAL HIGH (ref 0.2–1.0)
Monocytes Relative: 9 %
Neutro Abs: 12.5 10*3/uL — ABNORMAL HIGH (ref 1.4–6.5)
Neutrophils Relative %: 83 %
Platelets: 237 10*3/uL (ref 150–440)
RBC: 3.53 MIL/uL — ABNORMAL LOW (ref 4.40–5.90)
RDW: 15.5 % — ABNORMAL HIGH (ref 11.5–14.5)
WBC: 15.1 10*3/uL — ABNORMAL HIGH (ref 3.8–10.6)

## 2016-11-17 LAB — COMPREHENSIVE METABOLIC PANEL
ALT: 69 U/L — ABNORMAL HIGH (ref 17–63)
AST: 34 U/L (ref 15–41)
Albumin: 3.5 g/dL (ref 3.5–5.0)
Alkaline Phosphatase: 47 U/L (ref 38–126)
Anion gap: 6 (ref 5–15)
BUN: 64 mg/dL — ABNORMAL HIGH (ref 6–20)
CO2: 27 mmol/L (ref 22–32)
Calcium: 8.5 mg/dL — ABNORMAL LOW (ref 8.9–10.3)
Chloride: 110 mmol/L (ref 101–111)
Creatinine, Ser: 1.18 mg/dL (ref 0.61–1.24)
GFR calc Af Amer: 57 mL/min — ABNORMAL LOW (ref >60)
GFR calc non Af Amer: 49 mL/min — ABNORMAL LOW (ref >60)
Glucose, Bld: 177 mg/dL — ABNORMAL HIGH (ref 65–99)
Potassium: 3.9 mmol/L (ref 3.5–5.1)
Sodium: 143 mmol/L (ref 135–145)
Total Bilirubin: 0.8 mg/dL (ref 0.3–1.2)
Total Protein: 6.2 g/dL — ABNORMAL LOW (ref 6.5–8.1)

## 2016-11-17 LAB — URIC ACID: Uric Acid, Serum: 5.6 mg/dL (ref 4.4–7.6)

## 2016-11-17 MED ORDER — CHLORAMBUCIL 2 MG PO TABS: ORAL_TABLET | ORAL | 0 refills | Status: DC

## 2016-11-17 MED ORDER — AZITHROMYCIN 250 MG PO TABS: ORAL_TABLET | ORAL | 0 refills | Status: DC

## 2016-11-17 NOTE — Progress Notes (Signed)
11/17/2016 6:44 AM   Spencer Acosta 05/08/1915 [id number]  Referring provider: Kirk Ruths, MD [facility] [place], [place] [address]  CC: Follow up cystitis, Rt groin adenopathy / stage 3 lymphoma, Prostatic Hypertrophy, Bilateral Non-Complex Renal Cysts  HPI:  1 -Cystitis - single episode enterococcus cystitis 10/2016 found on eval heamturia and irritative voiding that has resolved. No stones / hydro by CT 10/2016. No h/o recent urinary retention.  No febrile / pyelo hospitalizations.   2 - Rt groin adenopathy / stage 3 lymphoma - progressive Rt groin adenopathy. BX-proved B cell lymphoma and now under care Dr. Mike Gip and receiving chemo.   3 - Prostatic Hypertrophy - s/p TURP years ago. Not on any specific GU meds. Prostate vol <40gm by CT 2017. Minimal bother at baseline.   4 - Bilateral Non-Complex Renal Cysts - Left peripelvic and bilateral <2cm non-complex cortical cysts by CT 10/2016. No mass effect or enancement.   Today " Spencer Acosta " is seen in f/u above. No recurrent cystitis. He now has BX-proved B cell lymphoma for which he is receiving treatment.  He is in independent living at [facility].     PMH: Past Medical History:  Diagnosis Date  . A-fib ([place])   . CKD (chronic kidney disease)   . Crohn's disease ([place])   . Diabetes mellitus without complication ([place])   . Dysrhythmia   . GI bleed   . Hypertension   . Hypertriglyceridemia 03/11/2014  . Lymphoma ([place])   . PVD (peripheral vascular disease) [facility])     Surgical History: Past Surgical History:  Procedure Laterality Date  . APPENDECTOMY    . TURP VAPORIZATION      Home Medications:  Allergies as of 11/17/2016      Reactions   Sulfa Antibiotics Nausea And Vomiting      Medication List       Accurate as of 11/17/16  6:44 AM. Always use your most recent med list.          acidophilus Caps capsule Take 1 capsule by mouth 2 (two) times daily.   albuterol 108 (90  Base) MCG/ACT inhaler Commonly known as:  PROVENTIL HFA;VENTOLIN HFA Inhale 2 puffs into the lungs every 6 (six) hours as needed.   allopurinol 300 MG tablet Commonly known as:  ZYLOPRIM Take 1 tablet (300 mg total) by mouth daily.   cholecalciferol 1000 units tablet Commonly known as:  VITAMIN D Take 1,000 Units by mouth daily.   latanoprost 0.005 % ophthalmic solution Commonly known as:  XALATAN Place 1 drop into both eyes at bedtime.   Mesalamine 400 MG Cpdr DR capsule Commonly known as:  ASACOL Take 2 capsules (800 mg total) by mouth 3 (three) times daily.   metoprolol tartrate 25 MG tablet Commonly known as:  LOPRESSOR Take 1 tablet (25 mg total) by mouth 2 (two) times daily.   multivitamin-lutein Caps capsule Take 1 capsule by mouth daily.   predniSONE 20 MG tablet Commonly known as:  DELTASONE Take 3 tablets (60 mg total) by mouth daily.   Rivaroxaban 15 & 20 MG Tbpk Take as directed on package: Start with one 15mg  tablet by mouth twice a day with food. On Day 22, switch to one 20mg  tablet once a day with food.       Allergies:  Allergies  Allergen Reactions  . Sulfa Antibiotics Nausea And Vomiting    Family History: Family History  Problem Relation Age of  Onset  . Diabetes Other     Social History:  reports that he quit smoking about 69 years ago. He has never used smokeless tobacco. He reports that he does not drink alcohol or use drugs.   Review of Systems  Gastrointestinal (upper)  : Negative for upper GI symptoms  Gastrointestinal (lower) : Negative for lower GI symptoms  Constitutional : Negative for symptoms  Skin: Negative for skin symptoms  Eyes: Negative for eye symptoms  Ear/Nose/Throat : Negative for Ear/Nose/Throat symptoms  Hematologic/Lymphatic: Negative for Hematologic/Lymphatic symptoms  Cardiovascular : Negative for cardiovascular symptoms  Respiratory : Negative for respiratory symptoms  Endocrine: Negative  for endocrine symptoms  Musculoskeletal: Negative for musculoskeletal symptoms  Neurological: Negative for neurological symptoms  Psychologic: Negative for psychiatric symptoms   Physical Exam: There were no vitals taken for this visit.  Constitutional:  Alert and oriented, No acute distress. His duagher from [place] accompanies him today.  HEENT: [place] AT, moist mucus membranes.  Trachea midline, no masses. Cardiovascular: No clubbing, cyanosis, or edema. Respiratory: Normal respiratory effort, no increased work of breathing. GI: Abdomen is soft, nontender, nondistended, no abdominal masses GU: No CVA tenderness. Uncirc'd. No phimosis. Palpable Rt inguinal adenopathy that is non-painful.  Skin: No rashes, bruises or suspicious lesions. Lymph: No cervical or inguinal adenopathy. Neurologic: Grossly intact, no focal deficits, moving all 4 extremities. Psychiatric: Normal mood and affect.  Laboratory Data: Lab Results  Component Value Date   WBC 13.0 (H) 11/12/2016   HGB 11.5 (L) 11/12/2016   HCT 34.6 (L) 11/12/2016   MCV 95.3 11/12/2016   PLT 235 11/12/2016    Lab Results  Component Value Date   CREATININE 1.21 11/12/2016     Pertinent Imaging: As per HPI  Assessment & Plan:    1 -Cystitis - resolved. No anatomic modifiable risk factors. Likely sporadic. No further eval warranted at this point.   2 - Rt groin adenopathy / stage 3 lymphoma - per cancer center.   3 - Prostatic Hypertrophy - doing well s/p prior outlet procedure, observe.  4 - Bilateral Non-Complex Renal Cysts - no indication for further evaluation or dedicated surveillance.  RTC Urol PRN at this point.    Alexis Frock, [facility] [address] [place], [place] [address] [phone]

## 2016-11-17 NOTE — Progress Notes (Signed)
Patient accompanied by daughter, Sharee Pimple.  She states patient is having frank blood in stools.  States cough is bad - keeps him awake at night.  Also causes stress incontinence.

## 2016-11-17 NOTE — Patient Instructions (Signed)
Azithromycin tablets What is this medicine? AZITHROMYCIN (az ith roe MYE sin) is a macrolide antibiotic. It is used to treat or prevent certain kinds of bacterial infections. It will not work for colds, flu, or other viral infections. This medicine may be used for other purposes; ask your health care provider or pharmacist if you have questions. COMMON BRAND NAME(S): Zithromax, Zithromax Tri-Pak, Zithromax Z-Pak What should I tell my health care provider before I take this medicine? They need to know if you have any of these conditions: -kidney disease -liver disease -irregular heartbeat or heart disease -an unusual or allergic reaction to azithromycin, erythromycin, other macrolide antibiotics, foods, dyes, or preservatives -pregnant or trying to get pregnant -breast-feeding How should I use this medicine? Take this medicine by mouth with a full glass of water. Follow the directions on the prescription label. The tablets can be taken with food or on an empty stomach. If the medicine upsets your stomach, take it with food. Take your medicine at regular intervals. Do not take your medicine more often than directed. Take all of your medicine as directed even if you think your are better. Do not skip doses or stop your medicine early. Talk to your pediatrician regarding the use of this medicine in children. While this drug may be prescribed for children as young as 6 months for selected conditions, precautions do apply. Overdosage: If you think you have taken too much of this medicine contact a poison control center or emergency room at once. NOTE: This medicine is only for you. Do not share this medicine with others. What if I miss a dose? If you miss a dose, take it as soon as you can. If it is almost time for your next dose, take only that dose. Do not take double or extra doses. What may interact with this medicine? Do not take this medicine with any of the following  medications: -lincomycin This medicine may also interact with the following medications: -amiodarone -antacids -birth control pills -cyclosporine -digoxin -magnesium -nelfinavir -phenytoin -warfarin This list may not describe all possible interactions. Give your health care provider a list of all the medicines, herbs, non-prescription drugs, or dietary supplements you use. Also tell them if you smoke, drink alcohol, or use illegal drugs. Some items may interact with your medicine. What should I watch for while using this medicine? Tell your doctor or healthcare professional if your symptoms do not start to get better or if they get worse. Do not treat diarrhea with over the counter products. Contact your doctor if you have diarrhea that lasts more than 2 days or if it is severe and watery. This medicine can make you more sensitive to the sun. Keep out of the sun. If you cannot avoid being in the sun, wear protective clothing and use sunscreen. Do not use sun lamps or tanning beds/booths. What side effects may I notice from receiving this medicine? Side effects that you should report to your doctor or health care professional as soon as possible: -allergic reactions like skin rash, itching or hives, swelling of the face, lips, or tongue -confusion, nightmares or hallucinations -dark urine -difficulty breathing -hearing loss -irregular heartbeat or chest pain -pain or difficulty passing urine -redness, blistering, peeling or loosening of the skin, including inside the mouth -white patches or sores in the mouth -yellowing of the eyes or skin Side effects that usually do not require medical attention (report to your doctor or health care professional if they continue or are bothersome): -  diarrhea -dizziness, drowsiness -headache -stomach upset or vomiting -tooth discoloration -vaginal irritation This list may not describe all possible side effects. Call your doctor for medical advice  about side effects. You may report side effects to FDA at 1-800-FDA-1088. Where should I keep my medicine? Keep out of the reach of children. Store at room temperature between 15 and 30 degrees C (59 and 86 degrees F). Throw away any unused medicine after the expiration date. NOTE: This sheet is a summary. It may not cover all possible information. If you have questions about this medicine, talk to your doctor, pharmacist, or health care provider.  2017 Elsevier/Gold Standard (2016-01-15 15:26:03) Chlorambucil tablets What is this medicine? CHLORAMBUCIL (clor AM byoo sil) is a chemotherapy drug. It slows the growth of cancer cells. This medicine is used to treat cancer like some leukemias and lymphomas. This medicine may be used for other purposes; ask your health care provider or pharmacist if you have questions. COMMON BRAND NAME(S): Leukeran What should I tell my health care provider before I take this medicine? They need to know if you have any of these conditions: -head trauma -liver disease -low blood counts, like low white cell, platelet, or red cell counts -recent or ongoing radiation therapy -seizures -an unusual or allergic reaction to chlorambucil, other chemotherapy, other medicines, foods, dyes, or preservatives -pregnant or trying to get pregnant -breast-feeding How should I use this medicine? Take this medicine by mouth with a glass of water. Follow the directions on the prescription label. Take your medicine at regular intervals. Do not take it more often than directed. Do not stop taking except on your doctor's advice. Talk to your pediatrician regarding the use of this medicine in children. Special care may be needed. Overdosage: If you think you have taken too much of this medicine contact a poison control center or emergency room at once. NOTE: This medicine is only for you. Do not share this medicine with others. What if I miss a dose? If you miss a dose, take it as  soon as you can. If it is almost time for your next dose, take only that dose. Do not take double or extra doses. What may interact with this medicine? Do not take this medicine with any of the following medication: -nalidixic acidThis medicine may also interact with the following: -live vaccines This list may not describe all possible interactions. Give your health care provider a list of all the medicines, herbs, non-prescription drugs, or dietary supplements you use. Also tell them if you smoke, drink alcohol, or use illegal drugs. Some items may interact with your medicine. What should I watch for while using this medicine? This drug may make you feel generally unwell. This is not uncommon, as chemotherapy can affect healthy cells as well as cancer cells. Report any side effects. Continue your course of treatment even though you feel ill unless your doctor tells you to stop. You may need blood work done while you are taking this medicine. Call your doctor or health care professional for advice if you get a fever, chills or sore throat, or other symptoms of a cold or flu. Do not treat yourself. This drug decreases your body's ability to fight infections. Try to avoid being around people who are sick. This medicine may increase your risk to bruise or bleed. Call your doctor or health care professional if you notice any unusual bleeding. Talk to your doctor about your risk of cancer. You may be more at risk  for certain types of cancers if you take this medicine. Do not become pregnant while taking this medicine. Women should inform their doctor if they wish to become pregnant or think they might be pregnant. There is a potential for serious side effects to an unborn child. Talk to your health care professional or pharmacist for more information. Do not breast-feed an infant while taking this medicine. This medicine has caused ovarian failure in some women and reduced sperm counts in some men. This  medicine may interfere with the ability to have or father a child. Talk with your doctor or health care professional if you are concerned about your fertility. What side effects may I notice from receiving this medicine? Side effects that you should report to your doctor or health care professional as soon as possible: -allergic reactions like skin rash, itching or hives, swelling of the face, lips, or tongue -breathing problems -confusion -hallucinations -low blood counts - this medicine may decrease the number of white blood cells, red blood cells and platelets. You may be at increased risk for infections and bleeding. -missed menstrual periods -redness, blistering, peeling or loosening of the skin, including inside the mouth -seizures -signs of decreased platelets or bleeding - bruising, pinpoint red spots on the skin, black, tarry stools, blood in the urine -signs of decreased red blood cells - unusually weak or tired, fainting spells, lightheadedness -signs of infection - fever or chills, cough, sore throat, pain or difficulty passing urine -signs and symptoms of liver injury like dark yellow or brown urine; general ill feeling or flu-like symptoms; light-colored stools; loss of appetite; nausea; right upper belly pain; unusually weak or tired; yellowing of the eyes or skin -tingling, pain or numbness in the hands or feet -tremors Side effects that usually do not require medical attention (report to your doctor or health care professional if they continue or are bothersome): -diarrhea -mouth sores -nausea, vomiting This list may not describe all possible side effects. Call your doctor for medical advice about side effects. You may report side effects to FDA at 1-800-FDA-1088. Where should I keep my medicine? Keep out of the reach of children. Store in the refrigerator between 2 and 8 degrees C (36 and 46 degrees F). Protect from moisture and light. Throw away any unused medicine after  the expiration date. NOTE: This sheet is a summary. It may not cover all possible information. If you have questions about this medicine, talk to your doctor, pharmacist, or health care provider.  2017 Elsevier/Gold Standard (2016-02-05 16:06:03)

## 2016-11-18 ENCOUNTER — Telehealth: Payer: Self-pay | Admitting: Hematology and Oncology

## 2016-11-18 ENCOUNTER — Inpatient Hospital Stay
Admission: EM | Admit: 2016-11-18 | Discharge: 2016-11-26 | DRG: 166 | Disposition: A | Discharge status: Skilled Nursing Facility | Payer: Medicare Other | Attending: Internal Medicine | Admitting: Internal Medicine

## 2016-11-18 ENCOUNTER — Other Ambulatory Visit: Payer: Self-pay

## 2016-11-18 ENCOUNTER — Encounter: Payer: Self-pay | Admitting: Hematology and Oncology

## 2016-11-18 ENCOUNTER — Inpatient Hospital Stay: Payer: Medicare Other

## 2016-11-18 ENCOUNTER — Other Ambulatory Visit: Payer: Self-pay | Admitting: Hematology and Oncology

## 2016-11-18 ENCOUNTER — Emergency Department: Payer: Medicare Other

## 2016-11-18 ENCOUNTER — Encounter: Payer: Self-pay | Admitting: Medical Oncology

## 2016-11-18 DIAGNOSIS — J9601 Acute respiratory failure with hypoxia: Secondary | ICD-10-CM | POA: Diagnosis present

## 2016-11-18 DIAGNOSIS — N189 Chronic kidney disease, unspecified: Secondary | ICD-10-CM | POA: Diagnosis not present

## 2016-11-18 DIAGNOSIS — E1122 Type 2 diabetes mellitus with diabetic chronic kidney disease: Secondary | ICD-10-CM | POA: Diagnosis present

## 2016-11-18 DIAGNOSIS — D62 Acute posthemorrhagic anemia: Secondary | ICD-10-CM | POA: Diagnosis present

## 2016-11-18 DIAGNOSIS — Z79899 Other long term (current) drug therapy: Secondary | ICD-10-CM

## 2016-11-18 DIAGNOSIS — I82411 Acute embolism and thrombosis of right femoral vein: Secondary | ICD-10-CM | POA: Diagnosis present

## 2016-11-18 DIAGNOSIS — H409 Unspecified glaucoma: Secondary | ICD-10-CM | POA: Diagnosis present

## 2016-11-18 DIAGNOSIS — B952 Enterococcus as the cause of diseases classified elsewhere: Secondary | ICD-10-CM | POA: Diagnosis present

## 2016-11-18 DIAGNOSIS — Z7952 Long term (current) use of systemic steroids: Secondary | ICD-10-CM | POA: Diagnosis not present

## 2016-11-18 DIAGNOSIS — N183 Chronic kidney disease, stage 3 (moderate): Secondary | ICD-10-CM | POA: Diagnosis present

## 2016-11-18 DIAGNOSIS — K50911 Crohn's disease, unspecified, with rectal bleeding: Secondary | ICD-10-CM | POA: Diagnosis present

## 2016-11-18 DIAGNOSIS — N281 Cyst of kidney, acquired: Secondary | ICD-10-CM | POA: Diagnosis present

## 2016-11-18 DIAGNOSIS — Z87891 Personal history of nicotine dependence: Secondary | ICD-10-CM | POA: Diagnosis not present

## 2016-11-18 DIAGNOSIS — D72829 Elevated white blood cell count, unspecified: Secondary | ICD-10-CM | POA: Diagnosis not present

## 2016-11-18 DIAGNOSIS — I482 Chronic atrial fibrillation: Secondary | ICD-10-CM | POA: Diagnosis present

## 2016-11-18 DIAGNOSIS — R319 Hematuria, unspecified: Secondary | ICD-10-CM | POA: Diagnosis present

## 2016-11-18 DIAGNOSIS — Z66 Do not resuscitate: Secondary | ICD-10-CM | POA: Diagnosis present

## 2016-11-18 DIAGNOSIS — N390 Urinary tract infection, site not specified: Secondary | ICD-10-CM | POA: Diagnosis present

## 2016-11-18 DIAGNOSIS — I129 Hypertensive chronic kidney disease with stage 1 through stage 4 chronic kidney disease, or unspecified chronic kidney disease: Secondary | ICD-10-CM | POA: Diagnosis present

## 2016-11-18 DIAGNOSIS — R0602 Shortness of breath: Secondary | ICD-10-CM | POA: Diagnosis not present

## 2016-11-18 DIAGNOSIS — J9801 Acute bronchospasm: Secondary | ICD-10-CM | POA: Diagnosis present

## 2016-11-18 DIAGNOSIS — I4891 Unspecified atrial fibrillation: Secondary | ICD-10-CM | POA: Diagnosis not present

## 2016-11-18 DIAGNOSIS — I82409 Acute embolism and thrombosis of unspecified deep veins of unspecified lower extremity: Secondary | ICD-10-CM | POA: Diagnosis not present

## 2016-11-18 DIAGNOSIS — Z833 Family history of diabetes mellitus: Secondary | ICD-10-CM | POA: Diagnosis not present

## 2016-11-18 DIAGNOSIS — Y95 Nosocomial condition: Secondary | ICD-10-CM | POA: Diagnosis present

## 2016-11-18 DIAGNOSIS — R591 Generalized enlarged lymph nodes: Secondary | ICD-10-CM | POA: Diagnosis present

## 2016-11-18 DIAGNOSIS — J189 Pneumonia, unspecified organism: Principal | ICD-10-CM | POA: Diagnosis present

## 2016-11-18 DIAGNOSIS — E781 Pure hyperglyceridemia: Secondary | ICD-10-CM | POA: Diagnosis present

## 2016-11-18 DIAGNOSIS — M109 Gout, unspecified: Secondary | ICD-10-CM | POA: Diagnosis present

## 2016-11-18 DIAGNOSIS — K922 Gastrointestinal hemorrhage, unspecified: Secondary | ICD-10-CM | POA: Diagnosis not present

## 2016-11-18 DIAGNOSIS — Z881 Allergy status to other antibiotic agents status: Secondary | ICD-10-CM | POA: Diagnosis not present

## 2016-11-18 DIAGNOSIS — Z86718 Personal history of other venous thrombosis and embolism: Secondary | ICD-10-CM

## 2016-11-18 DIAGNOSIS — C8290 Follicular lymphoma, unspecified, unspecified site: Secondary | ICD-10-CM | POA: Diagnosis present

## 2016-11-18 DIAGNOSIS — K5090 Crohn's disease, unspecified, without complications: Secondary | ICD-10-CM | POA: Diagnosis not present

## 2016-11-18 DIAGNOSIS — E1151 Type 2 diabetes mellitus with diabetic peripheral angiopathy without gangrene: Secondary | ICD-10-CM | POA: Diagnosis present

## 2016-11-18 DIAGNOSIS — I701 Atherosclerosis of renal artery: Secondary | ICD-10-CM | POA: Diagnosis not present

## 2016-11-18 DIAGNOSIS — C8225 Follicular lymphoma grade III, unspecified, lymph nodes of inguinal region and lower limb: Secondary | ICD-10-CM | POA: Diagnosis not present

## 2016-11-18 DIAGNOSIS — M6281 Muscle weakness (generalized): Secondary | ICD-10-CM

## 2016-11-18 DIAGNOSIS — R062 Wheezing: Secondary | ICD-10-CM

## 2016-11-18 DIAGNOSIS — R2681 Unsteadiness on feet: Secondary | ICD-10-CM

## 2016-11-18 LAB — CBC WITH DIFFERENTIAL/PLATELET
Basophils Absolute: 0 10*3/uL (ref 0–0.1)
Basophils Relative: 0 %
Eosinophils Absolute: 0 10*3/uL (ref 0–0.7)
Eosinophils Relative: 0 %
HEMATOCRIT: 35 % — AB (ref 40.0–52.0)
HEMOGLOBIN: 11.7 g/dL — AB (ref 13.0–18.0)
LYMPHS ABS: 1 10*3/uL (ref 1.0–3.6)
LYMPHS PCT: 6 %
MCH: 32.4 pg (ref 26.0–34.0)
MCHC: 33.5 g/dL (ref 32.0–36.0)
MCV: 96.8 fL (ref 80.0–100.0)
MONOS PCT: 9 %
Monocytes Absolute: 1.6 10*3/uL — ABNORMAL HIGH (ref 0.2–1.0)
NEUTROS ABS: 15.2 10*3/uL — AB (ref 1.4–6.5)
NEUTROS PCT: 85 %
Platelets: 235 10*3/uL (ref 150–440)
RBC: 3.61 MIL/uL — ABNORMAL LOW (ref 4.40–5.90)
RDW: 15.6 % — ABNORMAL HIGH (ref 11.5–14.5)
WBC: 17.8 10*3/uL — ABNORMAL HIGH (ref 3.8–10.6)

## 2016-11-18 LAB — COMPREHENSIVE METABOLIC PANEL
ALBUMIN: 3.3 g/dL — AB (ref 3.5–5.0)
ALT: 75 U/L — ABNORMAL HIGH (ref 17–63)
AST: 38 U/L (ref 15–41)
Alkaline Phosphatase: 47 U/L (ref 38–126)
Anion gap: 5 (ref 5–15)
BUN: 58 mg/dL — AB (ref 6–20)
CHLORIDE: 110 mmol/L (ref 101–111)
CO2: 27 mmol/L (ref 22–32)
Calcium: 8.5 mg/dL — ABNORMAL LOW (ref 8.9–10.3)
Creatinine, Ser: 1.16 mg/dL (ref 0.61–1.24)
GFR calc Af Amer: 58 mL/min — ABNORMAL LOW (ref >60)
GFR calc non Af Amer: 50 mL/min — ABNORMAL LOW (ref >60)
GLUCOSE: 164 mg/dL — AB (ref 65–99)
POTASSIUM: 4 mmol/L (ref 3.5–5.1)
Sodium: 142 mmol/L (ref 135–145)
Total Bilirubin: 0.9 mg/dL (ref 0.3–1.2)
Total Protein: 6.2 g/dL — ABNORMAL LOW (ref 6.5–8.1)

## 2016-11-18 LAB — PROTIME-INR
INR: 2.88
Prothrombin Time: 30.8 seconds — ABNORMAL HIGH (ref 11.4–15.2)

## 2016-11-18 LAB — URINALYSIS, COMPLETE (UACMP) WITH MICROSCOPIC
BILIRUBIN URINE: NEGATIVE
Glucose, UA: NEGATIVE mg/dL
KETONES UR: NEGATIVE mg/dL
LEUKOCYTES UA: NEGATIVE
Nitrite: NEGATIVE
PH: 5 (ref 5.0–8.0)
Protein, ur: 30 mg/dL — AB
Specific Gravity, Urine: 1.016 (ref 1.005–1.030)

## 2016-11-18 LAB — TROPONIN I: Troponin I: 0.05 ng/mL (ref <0.03)

## 2016-11-18 LAB — LACTIC ACID, PLASMA
Lactic Acid, Venous: 2 mmol/L (ref 0.5–1.9)
Lactic Acid, Venous: 1.3 mmol/L (ref 0.5–1.9)

## 2016-11-18 MED ORDER — ACETAMINOPHEN 650 MG RE SUPP: 650.0000 mg | Freq: Four times a day (QID) | RECTAL | Status: DC | PRN

## 2016-11-18 MED ORDER — GUAIFENESIN-DM 100-10 MG/5ML PO SYRP
5.0000 mL | ORAL_SOLUTION | ORAL | Status: DC | PRN
  Administered 2016-11-18 – 2016-11-22 (×4): 5 mL via ORAL
  Filled 2016-11-18 (×4): qty 5

## 2016-11-18 MED ORDER — RISAQUAD PO CAPS
1.0000 | ORAL_CAPSULE | Freq: Two times a day (BID) | ORAL | Status: DC
  Administered 2016-11-18 – 2016-11-26 (×16): 1 via ORAL
  Filled 2016-11-18 (×16): qty 1

## 2016-11-18 MED ORDER — MESALAMINE 400 MG PO CPDR
800.0000 mg | DELAYED_RELEASE_CAPSULE | Freq: Three times a day (TID) | ORAL | Status: DC
  Administered 2016-11-18 – 2016-11-26 (×24): 800 mg via ORAL
  Filled 2016-11-18 (×24): qty 2

## 2016-11-18 MED ORDER — ONDANSETRON HCL 4 MG/2ML IJ SOLN: 4.0000 mg | Freq: Four times a day (QID) | INTRAMUSCULAR | Status: DC | PRN

## 2016-11-18 MED ORDER — ONDANSETRON HCL 4 MG PO TABS: 4.0000 mg | ORAL_TABLET | Freq: Four times a day (QID) | ORAL | Status: DC | PRN

## 2016-11-18 MED ORDER — VANCOMYCIN HCL IN DEXTROSE 1-5 GM/200ML-% IV SOLN
1000.0000 mg | INTRAVENOUS | Status: DC
  Filled 2016-11-18: qty 200

## 2016-11-18 MED ORDER — RIVAROXABAN 20 MG PO TABS: 20.0000 mg | ORAL_TABLET | Freq: Every day | ORAL | Status: DC

## 2016-11-18 MED ORDER — RIVAROXABAN 15 MG PO TABS: 15.0000 mg | ORAL_TABLET | Freq: Two times a day (BID) | ORAL | Status: DC

## 2016-11-18 MED ORDER — ACETAMINOPHEN 325 MG PO TABS: 650.0000 mg | ORAL_TABLET | Freq: Four times a day (QID) | ORAL | Status: DC | PRN

## 2016-11-18 MED ORDER — PIPERACILLIN-TAZOBACTAM 3.375 G IVPB
3.3750 g | Freq: Three times a day (TID) | INTRAVENOUS | Status: DC
  Administered 2016-11-18 – 2016-11-24 (×18): 3.375 g via INTRAVENOUS
  Filled 2016-11-18 (×18): qty 50

## 2016-11-18 MED ORDER — ALBUTEROL SULFATE HFA 108 (90 BASE) MCG/ACT IN AERS: 2.0000 | INHALATION_SPRAY | Freq: Four times a day (QID) | RESPIRATORY_TRACT | Status: DC | PRN

## 2016-11-18 MED ORDER — METHYLPREDNISOLONE SODIUM SUCC 40 MG IJ SOLR
40.0000 mg | Freq: Two times a day (BID) | INTRAMUSCULAR | Status: DC
  Administered 2016-11-18 – 2016-11-19 (×3): 40 mg via INTRAVENOUS
  Filled 2016-11-18 (×2): qty 1

## 2016-11-18 MED ORDER — METHYLPREDNISOLONE SODIUM SUCC 40 MG IJ SOLR
INTRAMUSCULAR | Status: AC
  Administered 2016-11-18: 40 mg via INTRAVENOUS
  Filled 2016-11-18: qty 1

## 2016-11-18 MED ORDER — ALBUTEROL SULFATE (2.5 MG/3ML) 0.083% IN NEBU: 2.5000 mg | INHALATION_SOLUTION | Freq: Four times a day (QID) | RESPIRATORY_TRACT | Status: DC | PRN

## 2016-11-18 MED ORDER — METOPROLOL TARTRATE 25 MG PO TABS
25.0000 mg | ORAL_TABLET | Freq: Two times a day (BID) | ORAL | Status: DC
  Administered 2016-11-18 – 2016-11-26 (×16): 25 mg via ORAL
  Filled 2016-11-18 (×16): qty 1

## 2016-11-18 MED ORDER — LATANOPROST 0.005 % OP SOLN
1.0000 [drp] | Freq: Every day | OPHTHALMIC | Status: DC
  Administered 2016-11-18 – 2016-11-25 (×8): 1 [drp] via OPHTHALMIC
  Filled 2016-11-18: qty 2.5

## 2016-11-18 MED ORDER — VANCOMYCIN HCL IN DEXTROSE 1-5 GM/200ML-% IV SOLN
1000.0000 mg | Freq: Once | INTRAVENOUS | Status: DC
  Filled 2016-11-18: qty 200

## 2016-11-18 MED ORDER — ALLOPURINOL 300 MG PO TABS
300.0000 mg | ORAL_TABLET | Freq: Every day | ORAL | Status: DC
  Administered 2016-11-19 – 2016-11-22 (×4): 300 mg via ORAL
  Filled 2016-11-18 (×5): qty 1

## 2016-11-18 MED ORDER — OCUVITE-LUTEIN PO CAPS
1.0000 | ORAL_CAPSULE | Freq: Every day | ORAL | Status: DC
  Administered 2016-11-19 – 2016-11-26 (×8): 1 via ORAL
  Filled 2016-11-18 (×8): qty 1

## 2016-11-18 MED ORDER — RIVAROXABAN 15 MG PO TABS
15.0000 mg | ORAL_TABLET | Freq: Two times a day (BID) | ORAL | Status: DC
  Administered 2016-11-18 – 2016-11-20 (×4): 15 mg via ORAL
  Filled 2016-11-18 (×4): qty 1

## 2016-11-18 MED ORDER — PIPERACILLIN-TAZOBACTAM 3.375 G IVPB 30 MIN
3.3750 g | Freq: Once | INTRAVENOUS | Status: AC
  Administered 2016-11-18: 3.375 g via INTRAVENOUS
  Filled 2016-11-18: qty 50

## 2016-11-18 MED ORDER — VITAMIN D 1000 UNITS PO TABS
1000.0000 [IU] | ORAL_TABLET | Freq: Every day | ORAL | Status: DC
  Administered 2016-11-19 – 2016-11-26 (×8): 1000 [IU] via ORAL
  Filled 2016-11-18 (×8): qty 1

## 2016-11-18 NOTE — Progress Notes (Signed)
Pharmacy Antibiotic Note  Spencer Acosta is a 82 y.o. male admitted on 11/18/2016 with pneumonia/HCAP.  Pharmacy has been consulted for Vancomycin and Zosyn dosing.  Plan: Ke: 0.034   T1/2 20.4   Vd: 58  Will start the patient on Vancomycin 1gm IV every 24 hours with 6 hour stack dosing. Calculated trough at Css is 15. Plan for trough prior to 5th dose. Will monitor renal function and adjust dose as needed.  Recommend D/C of vancomycin based on patients negative MRSA PCR on 12/5.  Will start patient on Zosyn 3.375 IV EI every 8 hours.   Height: 5\' 8"  (172.7 cm) Weight: 184 lb (83.5 kg) IBW/kg (Calculated) : 68.4  Temp (24hrs), Avg:98.2 F (36.8 C), Min:97.9 F (36.6 C), Max:98.5 F (36.9 C)   Recent Labs Lab 11/12/16 0856 11/17/16 0821 11/18/16 0832  WBC 13.0* 15.1* 17.8*  CREATININE 1.21 1.18 1.16  LATICACIDVEN  --   --  2.0*    Estimated Creatinine Clearance: 36.5 mL/min (by C-G formula based on SCr of 1.16 mg/dL).    Allergies  Allergen Reactions  . Sulfa Antibiotics Nausea And Vomiting    Antimicrobials this admission: 12/19 Vancomycin >>  12/19 Zosyn >>   Dose adjustments this admission:   Microbiology results: BCx: pending UCx: pending  Sputum:   12/05 MRSA PCR: negative   Thank you for allowing pharmacy to be a part of this patient's care.  Pernell Dupre, PharmD, BCPS Clinical Pharmacist 11/18/2016 12:04 PM

## 2016-11-18 NOTE — ED Provider Notes (Signed)
[facility] Emergency Department Provider Note   ____________________________________________   First MD Initiated Contact with Patient 11/18/16 [phone]     (approximate)  I have reviewed the triage vital signs and the nursing notes.   HISTORY  Chief Complaint Weakness; Nausea; and Shortness of Breath    HPI Spencer Acosta is a 82 y.o. male patient had a CT scan of the chest recently which showed a right lower lobe consolidation. Patient was started on Zithromax yesterday evening. This morning patient had an episode of feeling very chilled weak and queasy tachypnea oncology was supposed to start his chemotherapy this morning told him to come to the hospital. Here the patient's awake alert oriented looks well much younger than his stated age in fact. He is coughing. His daughter says that he got pale and sweaty was breathing 60 times a minute. He was very sick this morning.   Past Medical History:  Diagnosis Date  . A-fib ([place])   . CKD (chronic kidney disease)   . Crohn's disease ([place])   . Diabetes mellitus without complication ([place])   . Dysrhythmia   . GI bleed   . Hypertension   . Hypertriglyceridemia 03/11/2014  . Lymphoma ([place])   . PVD (peripheral vascular disease) [facility])     Patient Active Problem List   Diagnosis Date Noted  . Acute cystitis 11/17/2016  . Bilateral renal cysts, <2cm non-complex.  11/17/2016  . Cellulitis of right leg 11/04/2016  . Right leg DVT ([place]) 11/04/2016  . Lymphoma ([place]) 11/04/2016  . CKD (chronic kidney disease), stage III 11/04/2016  . Gross hematuria 10/10/2016  . Inguinal lymphadenopathy 10/10/2016  . Atrial fibrillation ([place]) 03/20/2016  . Colitis 02/19/2016  . Bleeding hemorrhoid 07/19/2015  . Atherosclerosis of aorta ([place] 10/01/2014  . Crohn's disease of colon ([place]) 03/11/2014  . BP (high blood pressure) 03/11/2014  . Hypertriglyceridemia 03/11/2014  . Peripheral vascular disease ([place]) 03/11/2014  . Type 2  diabetes mellitus ([place]) 03/11/2014    Past Surgical History:  Procedure Laterality Date  . APPENDECTOMY    . TURP VAPORIZATION      Prior to Admission medications   Medication Sig Start Date End Date Taking? Authorizing Provider  acidophilus (RISAQUAD) CAPS capsule Take 1 capsule by mouth 2 (two) times daily. 02/22/16  Yes Loletha Grayer, MD  Mesalamine (ASACOL) 400 MG CPDR DR capsule Take 2 capsules (800 mg total) by mouth 3 (three) times daily. 02/22/16  Yes Loletha Grayer, MD  albuterol (PROVENTIL HFA;VENTOLIN HFA) 108 (90 Base) MCG/ACT inhaler Inhale 2 puffs into the lungs every 6 (six) hours as needed. 03/17/16   Loney Hering, MD  allopurinol (ZYLOPRIM) 300 MG tablet Take 1 tablet (300 mg total) by mouth daily. 11/12/16   Lequita Asal, MD  azithromycin (ZITHROMAX Z-PAK) 250 MG tablet 500 mg po on day 1 followed by 250 mg po once daily on days 2 to 5 Patient not taking: Reported on 11/17/2016 11/17/16   Lequita Asal, MD  chlorambucil (LEUKERAN) 2 MG tablet Take 8 mg (4 tablets) by mouth daily.  Give on an empty stomach 1 hour before or 2 hours after meals. 11/17/16   Lequita Asal, MD  cholecalciferol (VITAMIN D) 1000 units tablet Take 1,000 Units by mouth daily.    Historical Provider, MD  latanoprost (XALATAN) 0.005 % ophthalmic solution Place 1 drop into both eyes at bedtime.    Historical Provider, MD  metoprolol tartrate (LOPRESSOR) 25 MG tablet Take 1 tablet (  25 mg total) by mouth 2 (two) times daily. 02/22/16   Loletha Grayer, MD  multivitamin-lutein [facility]) CAPS capsule Take 1 capsule by mouth daily.    Historical Provider, MD  predniSONE (DELTASONE) 20 MG tablet Take 3 tablets (60 mg total) by mouth daily. 03/17/16   Loney Hering, MD  Rivaroxaban 15 & 20 MG TBPK Take as directed on package: Start with one 15mg  tablet by mouth twice a day with food. On Day 22, switch to one 20mg  tablet once a day with food. 11/06/16   Lytle Butte, MD     Allergies Sulfa antibiotics  Family History  Problem Relation Age of Onset  . Diabetes Other     Social History Social History  Substance Use Topics  . Smoking status: Former Smoker    Quit date: 10/22/1947  . Smokeless tobacco: Never Used     Comment: quit 70 years ago  . Alcohol use No     Comment: occasional    Review of Systems Constitutional: No fever/chills Eyes: No visual changes. ENT: No sore throat. Cardiovascular: Denies chest pain. RespiratoryEarlier today had shortness of breath. Gastrointestinal: No abdominal pain.   nausea, no vomiting.  No diarrhea.  No constipation. Genitourinary: Negative for dysuria. Musculoskeletal: Negative for back pain. Skin: Negative for rash. Neurological: Negative for headaches, focal weakness  10-point ROS otherwise negative.  ____________________________________________   PHYSICAL EXAM:  VITAL SIGNS: ED Triage Vitals  Enc Vitals Group     BP 11/18/16 0810 128/61     Pulse Rate 11/18/16 0810 81     Resp 11/18/16 0810 18     Temp 11/18/16 0810 98.5 F (36.9 C)     Temp Source 11/18/16 0810 Oral     SpO2 11/18/16 0810 93 %     Weight 11/18/16 0811 184 lb (83.5 kg)     Height 11/18/16 0811 5\' 8"  (1.727 m)     Head Circumference --      Peak Flow --      Pain Score 11/18/16 0811 0     Pain Loc --      Pain Edu? --      Excl. in [place]? --     Constitutional: Alert and oriented. Well appearing and in no acute distress. Eyes: Conjunctivae are normal. PERRL. EOMI. Head: Atraumatic. Nose: No congestion/rhinnorhea. Mouth/Throat: Mucous membranes are moist.  Oropharynx non-erythematous. Neck: No stridor.  Cardiovascular: Normal rate, regular rhythm. Grossly normal heart sounds.  Good peripheral circulation. Respiratory: Normal respiratory effort.  No retractions. Lungs CTAB. Gastrointestinal: Soft and nontender. No distention. No abdominal bruits. No CVA tenderness. Musculoskeletal: No lower extremity tenderness nor  edema.  No joint effusions. Skin:  Skin is warm, dry and intact. No rash noted.   ____________________________________________   LABS (all labs ordered are listed, but only abnormal results are displayed)  Labs Reviewed  COMPREHENSIVE METABOLIC PANEL - Abnormal; Notable for the following:       Result Value   Glucose, Bld 164 (*)    BUN 58 (*)    Calcium 8.5 (*)    Total Protein 6.2 (*)    Albumin 3.3 (*)    ALT 75 (*)    GFR calc non Af Amer 50 (*)    GFR calc Af Amer 58 (*)    All other components within normal limits  LACTIC ACID, PLASMA - Abnormal; Notable for the following:    Lactic Acid, Venous 2.0 (*)    All other components within normal limits  CBC WITH DIFFERENTIAL/PLATELET - Abnormal; Notable for the following:    WBC 17.8 (*)    RBC 3.61 (*)    Hemoglobin 11.7 (*)    HCT 35.0 (*)    RDW 15.6 (*)    Neutro Abs 15.2 (*)    Monocytes Absolute 1.6 (*)    All other components within normal limits  PROTIME-INR - Abnormal; Notable for the following:    Prothrombin Time 30.8 (*)    All other components within normal limits  CULTURE, BLOOD (ROUTINE X 2)  CULTURE, BLOOD (ROUTINE X 2)  URINE CULTURE  LACTIC ACID, PLASMA  URINALYSIS, COMPLETE (UACMP) WITH MICROSCOPIC  URINALYSIS, COMPLETE (UACMP) WITH MICROSCOPIC  TROPONIN I  CBC WITH DIFFERENTIAL/PLATELET  URINALYSIS, ROUTINE W REFLEX MICROSCOPIC  I-STAT CG4 LACTIC ACID, ED  I-STAT CG4 LACTIC ACID, ED   ____________________________________________  EKG  EKG read and interpreted by me shows what appears to be atrial fibrillation at controlled rate left axis occasional PVCs and no acute ST-T wave changes ____________________________________________  RADIOLOGY Study Result   CLINICAL DATA:  Cough.  EXAM: CHEST  2 VIEW  COMPARISON:  CT 11/14/2016.  Chest x-ray 03/17/2016.  FINDINGS: Right upper lobe and right lower lobe pulmonary infiltrates consistent with pneumonia. Small right pleural  effusion. Cardiomegaly with normal pulmonary vascularity .  IMPRESSION: Prominent right upper lobe and right lower lobe infiltrates consistent with pneumonia. Small right pleural effusion.   Electronically Signed   By: Marcello Moores  Register   On: 11/18/2016 09:05      ____________________________________________   PROCEDURES  Procedure(s) performed:  Procedures  Critical Care performed:  ____________________________________________   INITIAL IMPRESSION / ASSESSMENT AND PLAN / ED COURSE  Pertinent labs & imaging results that were available during my care of the patient were reviewed by me and considered in my medical decision making (see chart for details).    Clinical Course      ____________________________________________   FINAL CLINICAL IMPRESSION(S) / ED DIAGNOSES  Final diagnoses:  Healthcare-associated pneumonia      NEW MEDICATIONS STARTED DURING THIS VISIT:  New Prescriptions   No medications on file     Note:  This document was prepared using Dragon voice recognition software and may include unintentional dictation errors.    Nena Polio, MD 11/18/16 [phone]

## 2016-11-18 NOTE — Telephone Encounter (Signed)
Re:  Pneumonia.  The patient's daughter, [name], called regarding her father. He has been feeling poorly. He has nausea and is holding his head. He took Delsym last night for cough. He feels cold and chilly.  He started azithromycin last night.  I discussed bringing the patient to emergency room.  He will likely be admitted.  Treatment (Rituxan) will be held today.  The emergency room was contacted.  Lequita Asal, MD

## 2016-11-18 NOTE — H&P (Signed)
[facility] NAME: Spencer Acosta    MR#:  [id number]  DATE OF BIRTH:  08-13-1915  DATE OF ADMISSION:  11/18/2016  PRIMARY CARE PHYSICIAN: Kirk Ruths., MD   REQUESTING/REFERRING PHYSICIAN: Dr. Conni Slipper  CHIEF COMPLAINT:   Chief Complaint  Patient presents with  . Weakness  . Nausea  . Shortness of Breath    HISTORY OF PRESENT ILLNESS:  Spencer Acosta  is a 82 y.o. male with a known history of Chronic atrial fibrillation, chronic kidney disease, Crohn's disease, diabetes, previous history of GI bleed, hypertension, peripheral vascular disease, recent diagnosis of follicular lymphoma who presents to the hospital due to weakness, shortness of breath and cough. Patient was just diagnosed with bulky lymphadenopathy secondary to follicular lymphoma last week and was going to be started on chemotherapy this week but has been feeling significantly short of breath with a productive cough now for the past few days. Daughter says this morning he was so short of breath even at rest and therefore she brought him to the ER for further evaluation. The emergency room patient underwent a chest x-ray which showed multilobar pneumonia. Hospitalist services were contacted further treatment and evaluation. Patient denies any chest pain, fever, chills, abdominal pain, melena, hematochezia, hemoptysis or any other associated symptoms presently.  PAST MEDICAL HISTORY:   Past Medical History:  Diagnosis Date  . A-fib ([place])   . CKD (chronic kidney disease)   . Crohn's disease ([place])   . Diabetes mellitus without complication ([place])   . Dysrhythmia   . GI bleed   . Hypertension   . Hypertriglyceridemia 03/11/2014  . Lymphoma ([place])   . PVD (peripheral vascular disease) ([place])     PAST SURGICAL HISTORY:   Past Surgical History:  Procedure Laterality Date  . APPENDECTOMY    . TURP VAPORIZATION      SOCIAL HISTORY:   Social History  Substance  Use Topics  . Smoking status: Former Smoker    Quit date: 10/22/1947  . Smokeless tobacco: Never Used     Comment: quit 70 years ago  . Alcohol use No     Comment: occasional    FAMILY HISTORY:   Family History  Problem Relation Age of Onset  . Diabetes Other   . Diabetes Mother     DRUG ALLERGIES:   Allergies  Allergen Reactions  . Sulfa Antibiotics Nausea And Vomiting    REVIEW OF SYSTEMS:   Review of Systems  Constitutional: Negative for fever and weight loss.  HENT: Negative for congestion, nosebleeds and tinnitus.   Eyes: Negative for blurred vision, double vision and redness.  Respiratory: Positive for cough and shortness of breath. Negative for hemoptysis.   Cardiovascular: Negative for chest pain, orthopnea, leg swelling and PND.  Gastrointestinal: Positive for nausea. Negative for abdominal pain, diarrhea, melena and vomiting.  Genitourinary: Negative for dysuria, hematuria and urgency.  Musculoskeletal: Negative for falls and joint pain.  Neurological: Positive for weakness. Negative for dizziness, tingling, sensory change, focal weakness, seizures and headaches.  Endo/Heme/Allergies: Negative for polydipsia. Does not bruise/bleed easily.  Psychiatric/Behavioral: Negative for depression and memory loss. The patient is not nervous/anxious.     MEDICATIONS AT HOME:   Prior to Admission medications   Medication Sig Start Date End Date Taking? Authorizing Provider  acidophilus (RISAQUAD) CAPS capsule Take 1 capsule by mouth 2 (two) times daily. 02/22/16  Yes Loletha Grayer, MD  allopurinol (ZYLOPRIM) 300 MG tablet Take 1 tablet (300  mg total) by mouth daily. 11/12/16  Yes Lequita Asal, MD  azithromycin (ZITHROMAX Z-PAK) 250 MG tablet 500 mg po on day 1 followed by 250 mg po once daily on days 2 to 5 11/17/16  Yes Lequita Asal, MD  cholecalciferol (VITAMIN D) 1000 units tablet Take 1,000 Units by mouth daily.   Yes Historical Provider, MD  Mesalamine  (ASACOL) 400 MG CPDR DR capsule Take 2 capsules (800 mg total) by mouth 3 (three) times daily. 02/22/16  Yes Loletha Grayer, MD  metoprolol tartrate (LOPRESSOR) 25 MG tablet Take 1 tablet (25 mg total) by mouth 2 (two) times daily. 02/22/16  Yes Richard Leslye Peer, MD  multivitamin-lutein [facility]) CAPS capsule Take 1 capsule by mouth daily.   Yes Historical Provider, MD  predniSONE (DELTASONE) 20 MG tablet Take 3 tablets (60 mg total) by mouth daily. 03/17/16  Yes Loney Hering, MD  Rivaroxaban 15 & 20 MG TBPK Take as directed on package: Start with one 15mg  tablet by mouth twice a day with food. On Day 22, switch to one 20mg  tablet once a day with food. 11/06/16  Yes Lytle Butte, MD  albuterol (PROVENTIL HFA;VENTOLIN HFA) 108 (90 Base) MCG/ACT inhaler Inhale 2 puffs into the lungs every 6 (six) hours as needed. 03/17/16   Loney Hering, MD  chlorambucil (LEUKERAN) 2 MG tablet Take 8 mg (4 tablets) by mouth daily.  Give on an empty stomach 1 hour before or 2 hours after meals. Patient not taking: Reported on 11/18/2016 11/17/16   Lequita Asal, MD  latanoprost (XALATAN) 0.005 % ophthalmic solution Place 1 drop into both eyes at bedtime.    Historical Provider, MD      VITAL SIGNS:  Blood pressure 128/61, pulse 81, temperature 98.5 F (36.9 C), temperature source Oral, resp. rate 18, height 5\' 8"  (1.727 m), weight 83.5 kg (184 lb), SpO2 93 %.  PHYSICAL EXAMINATION:  Physical Exam  GENERAL:  82 y.o.-year-old patient lying in the bed in mild resp. Distress.  EYES: Pupils equal, round, reactive to light and accommodation. No scleral icterus. Extraocular muscles intact.  HEENT: Head atraumatic, normocephalic. Oropharynx and nasopharynx clear. No oropharyngeal erythema, moist oral mucosa  NECK:  Supple, no jugular venous distention. No thyroid enlargement, no tenderness. + enlarged submandibular and submental lymph nodes.  LUNGS: Poor respiratory effort. Diffuse rhonchi bilaterally.  Positive use of accessory muscles.  CARDIOVASCULAR: S1, S2 RRR. No murmurs, rubs, gallops, clicks.  ABDOMEN: Soft, nontender, nondistended. Bowel sounds present. No organomegaly or mass.  EXTREMITIES: +1-2 edema b/l, No cyanosis, or clubbing. + 2 pedal & radial pulses b/l.   NEUROLOGIC: Cranial nerves II through XII are intact. No focal Motor or sensory deficits appreciated b/l. Globally weak.  PSYCHIATRIC: The patient is alert and oriented x 3.  SKIN: No obvious rash, lesion, or ulcer.   LABORATORY PANEL:   CBC  Recent Labs Lab 11/18/16 0832  WBC 17.8*  HGB 11.7*  HCT 35.0*  PLT 235   ------------------------------------------------------------------------------------------------------------------  Chemistries   Recent Labs Lab 11/18/16 0832  NA 142  K 4.0  CL 110  CO2 27  GLUCOSE 164*  BUN 58*  CREATININE 1.16  CALCIUM 8.5*  AST 38  ALT 75*  ALKPHOS 47  BILITOT 0.9   ------------------------------------------------------------------------------------------------------------------  Cardiac Enzymes  Recent Labs Lab 11/18/16 0832  TROPONINI 0.05*   ------------------------------------------------------------------------------------------------------------------  RADIOLOGY:  Dg Chest 2 View  Result Date: 11/18/2016 CLINICAL DATA:  Cough. EXAM: CHEST  2 VIEW COMPARISON:  CT 11/14/2016.  Chest x-ray 03/17/2016. FINDINGS: Right upper lobe and right lower lobe pulmonary infiltrates consistent with pneumonia. Small right pleural effusion. Cardiomegaly with normal pulmonary vascularity . IMPRESSION: Prominent right upper lobe and right lower lobe infiltrates consistent with pneumonia. Small right pleural effusion. Electronically Signed   By: Marcello Moores  Register   On: 11/18/2016 09:05     IMPRESSION AND PLAN:   82 year old male with past medical history of peripheral asked disease, hypertension, hyperlipidemia, previous history of GI bleed, diabetes, Crohn's disease,  chronic stage III, chronic atrial fibrillation, recent diagnosis of a right lower extremity DVT who presents to the hospital due to cough, shortness of breath and weakness.   1. Acute respiratory failure with hypoxia-secondary to pneumonia. -We'll treat patient with IV antibiotics with vancomycin, Zosyn. Continue O2 supplementation and wean as tolerated.  2. Pneumonia-patient noted to have chest x-ray and CT scan findings suggestive of right upper lobe and lower lobe infiltrates. -Patient was recently hospitalized for a right lower extremity DVT. I will treat the patient with IV vancomycin, Zosyn, follow blood, sputum cultures. -Patient is having some mild bronchospasm therefore we'll place the patient on IV steroids, antitussives.  3. Recent history of a right lower extremity DVT-continue Xarelto.  4. Leukocytosis-secondary to the pneumonia. Follow with IV antibiotic therapy.  5. Recent diagnosis of follicular lymphoma-patient has not started treatment yet. -Patient is followed by Dr. Mike Gip. I will consult oncology.  6. History of gout-no acute attack. Continue allopurinol.  7. History of Crohn's disease-continue mesalamine.  8. Essential hypertension-continue metoprolol.  9. Glaucoma-continue latanoprost eyedrops.    All the records are reviewed and case discussed with ED provider. Management plans discussed with the patient, family and they are in agreement.  CODE STATUS: DO NOT RESUSCITATE  TOTAL TIME TAKING CARE OF THIS PATIENT: 45 minutes.    Henreitta Leber M.D on 11/18/2016 at 9:56 AM  Between 7am to 6pm - Pager - [phone]  After 6pm go to [email or web address] - password [email or web address]  Office  [phone]  CC: Primary care physician; Kirk Ruths., MD

## 2016-11-18 NOTE — ED Triage Notes (Signed)
Pt was due to have first chemo treatment today for lymphoma, pt reports he feels sick to his stomach and feels weak. Pt began taking abx last night for pneumonia.

## 2016-11-18 NOTE — ED Notes (Signed)
[facility] called to floor.  Currently a specialty bed in room, waiting for bed to be removed and regular bed placed in room.  Skylar, RN to call when room ready.

## 2016-11-19 DIAGNOSIS — Z7901 Long term (current) use of anticoagulants: Secondary | ICD-10-CM

## 2016-11-19 DIAGNOSIS — Z79899 Other long term (current) drug therapy: Secondary | ICD-10-CM

## 2016-11-19 DIAGNOSIS — R0602 Shortness of breath: Secondary | ICD-10-CM

## 2016-11-19 DIAGNOSIS — Z66 Do not resuscitate: Secondary | ICD-10-CM

## 2016-11-19 DIAGNOSIS — D72829 Elevated white blood cell count, unspecified: Secondary | ICD-10-CM

## 2016-11-19 DIAGNOSIS — I4891 Unspecified atrial fibrillation: Secondary | ICD-10-CM

## 2016-11-19 DIAGNOSIS — E781 Pure hyperglyceridemia: Secondary | ICD-10-CM

## 2016-11-19 DIAGNOSIS — C8225 Follicular lymphoma grade III, unspecified, lymph nodes of inguinal region and lower limb: Secondary | ICD-10-CM

## 2016-11-19 DIAGNOSIS — I129 Hypertensive chronic kidney disease with stage 1 through stage 4 chronic kidney disease, or unspecified chronic kidney disease: Secondary | ICD-10-CM

## 2016-11-19 DIAGNOSIS — J189 Pneumonia, unspecified organism: Principal | ICD-10-CM

## 2016-11-19 DIAGNOSIS — Z87891 Personal history of nicotine dependence: Secondary | ICD-10-CM

## 2016-11-19 DIAGNOSIS — E1122 Type 2 diabetes mellitus with diabetic chronic kidney disease: Secondary | ICD-10-CM

## 2016-11-19 DIAGNOSIS — K5090 Crohn's disease, unspecified, without complications: Secondary | ICD-10-CM

## 2016-11-19 DIAGNOSIS — N189 Chronic kidney disease, unspecified: Secondary | ICD-10-CM

## 2016-11-19 LAB — GLUCOSE, CAPILLARY
GLUCOSE-CAPILLARY: 259 mg/dL — AB (ref 65–99)
Glucose-Capillary: 164 mg/dL — ABNORMAL HIGH (ref 65–99)

## 2016-11-19 LAB — CBC
HCT: 30.9 % — ABNORMAL LOW (ref 40.0–52.0)
Hemoglobin: 10.3 g/dL — ABNORMAL LOW (ref 13.0–18.0)
MCH: 31.8 pg (ref 26.0–34.0)
MCHC: 33.4 g/dL (ref 32.0–36.0)
MCV: 95.2 fL (ref 80.0–100.0)
PLATELETS: 186 10*3/uL (ref 150–440)
RBC: 3.24 MIL/uL — ABNORMAL LOW (ref 4.40–5.90)
RDW: 15.5 % — AB (ref 11.5–14.5)
WBC: 14.9 10*3/uL — AB (ref 3.8–10.6)

## 2016-11-19 LAB — BASIC METABOLIC PANEL
Anion gap: 5 (ref 5–15)
BUN: 61 mg/dL — AB (ref 6–20)
CO2: 27 mmol/L (ref 22–32)
CREATININE: 1.35 mg/dL — AB (ref 0.61–1.24)
Calcium: 8.4 mg/dL — ABNORMAL LOW (ref 8.9–10.3)
Chloride: 111 mmol/L (ref 101–111)
GFR calc Af Amer: 48 mL/min — ABNORMAL LOW (ref >60)
GFR calc non Af Amer: 42 mL/min — ABNORMAL LOW (ref >60)
GLUCOSE: 191 mg/dL — AB (ref 65–99)
Potassium: 4.3 mmol/L (ref 3.5–5.1)
SODIUM: 143 mmol/L (ref 135–145)

## 2016-11-19 MED ORDER — METHYLPREDNISOLONE SODIUM SUCC 40 MG IJ SOLR
40.0000 mg | INTRAMUSCULAR | Status: DC
  Administered 2016-11-20 – 2016-11-26 (×7): 40 mg via INTRAVENOUS
  Filled 2016-11-19 (×7): qty 1

## 2016-11-19 MED ORDER — INSULIN ASPART 100 UNIT/ML ~~LOC~~ SOLN
0.0000 [IU] | Freq: Three times a day (TID) | SUBCUTANEOUS | Status: DC
  Administered 2016-11-19: 18:00:00 2 [IU] via SUBCUTANEOUS
  Administered 2016-11-20 (×2): 1 [IU] via SUBCUTANEOUS
  Administered 2016-11-20 – 2016-11-21 (×3): 2 [IU] via SUBCUTANEOUS
  Filled 2016-11-19 (×4): qty 2
  Filled 2016-11-19 (×2): qty 1

## 2016-11-19 NOTE — Evaluation (Signed)
Physical Therapy Evaluation Patient Details Name: Spencer Acosta MRN: [id number] DOB: [birth year] Today's Date: 11/19/2016   History of Present Illness  Pt is a very pleasant 82 y.o. male presenting to hospital with weakness, nausea, and SOB.  Pt with recent dx of follicular lymphoma and R LE DVT.  Pt admitted with acute respiratory failure with hypoxia secondary to multilobar PNA.  PMH includes a-fib, CKD, Crohn's disease, DM, htn, PVD, R LE DVT 11/04/16.  Clinical Impression  Prior to hospital admission, pt was modified independent with functional mobility (intermittently using RW or SPC as needed).  Pt lives alone in [place] at [facility] and has aide 3x/week for 2 hours each time.  Currently pt is CGA with supine to sit, transfers, and taking a few steps bed to recliner with RW.  Limited distance ambulating d/t pt's IV site bleeding (nursing present and addressing).  Pt would benefit from skilled PT to address noted impairments and functional limitations.  Recommend pt discharge to home with HHPT and prior level of support when medically appropriate.    Follow Up Recommendations Home health PT    Equipment Recommendations   (pt already owns RW)    Recommendations for Other Services       Precautions / Restrictions Precautions Precautions: Fall Restrictions Weight Bearing Restrictions: No      Mobility  Bed Mobility Overal bed mobility: Needs Assistance Bed Mobility: Supine to Sit     Supine to sit: Min guard;HOB elevated     General bed mobility comments: increased effort to perform on own; use of side rail  Transfers Overall transfer level: Needs assistance Equipment used: Rolling walker (2 wheeled) Transfers: Sit to/from Stand Sit to Stand: Min guard         General transfer comment: x2 reps; steady strong stand without loss of balance  Ambulation/Gait Ambulation/Gait assistance: Min guard Ambulation Distance (Feet): 3 Feet (bed to chair) Assistive  device: Rolling walker (2 wheeled)       General Gait Details: pt requiring vc's for UE support and walker use but no loss of balance noted  Stairs            Wheelchair Mobility    Modified Rankin (Stroke Patients Only)       Balance Overall balance assessment: Needs assistance Sitting-balance support: Bilateral upper extremity supported;Feet supported Sitting balance-Leahy Scale: Good     Standing balance support: Bilateral upper extremity supported (on RW) Standing balance-Leahy Scale: Good                               Pertinent Vitals/Pain Pain Assessment: No/denies pain  Vitals (HR and O2) stable and WFL throughout treatment session.    Home Living Family/patient expects to be discharged to:: Other (Comment) ([facility]dependent Living at [facility])                      Prior Function Level of Independence: Independent with assistive device(s)         Comments: Pt appears to be a poor historian but does report that he using RW in the morning initially and SPC occassionally throughout day (pt's daughter reporting this as accurate).  Pt has aide 3x/week for 2 hours each time (for cleaning, laundry).     Hand Dominance        Extremity/Trunk Assessment   Upper Extremity Assessment Upper Extremity Assessment: Generalized weakness    Lower Extremity Assessment  Lower Extremity Assessment: Generalized weakness       Communication   Communication: HOH  Cognition Arousal/Alertness: Awake/alert Behavior During Therapy: WFL for tasks assessed/performed Overall Cognitive Status: History of cognitive impairments - at baseline (Oriented to person.  Baseline confusion.)                      General Comments General comments (skin integrity, edema, etc.): Pt's daughter present during session.  Nursing cleared pt for participation in physical therapy.  Pt and pt's daughter agreeable to PT session.    Exercises     Assessment/Plan     PT Assessment Patient needs continued PT services  PT Problem List Decreased strength;Decreased balance;Decreased mobility          PT Treatment Interventions DME instruction;Gait training;Functional mobility training;Therapeutic activities;Therapeutic exercise;Balance training;Patient/family education    PT Goals (Current goals can be found in the Care Plan section)  Acute Rehab PT Goals Patient Stated Goal: to go for a walk PT Goal Formulation: With patient/family Time For Goal Achievement: 12/03/16 Potential to Achieve Goals: Good    Frequency Min 2X/week   Barriers to discharge        Co-evaluation               End of Session Equipment Utilized During Treatment: Gait belt Activity Tolerance: Patient tolerated treatment well Patient left: in chair;with call bell/phone within reach;with chair alarm set;with nursing/sitter in room;with family/visitor present Nurse Communication: Mobility status;Precautions (pt's IV site bleeding)         Time: 1320-1350 PT Time Calculation (min) (ACUTE ONLY): 30 min   Charges:   PT Evaluation $PT Eval Low Complexity: 1 Procedure PT Treatments $Therapeutic Activity: 8-22 mins   PT G CodesLeitha Bleak 04-Dec-2016, 5:09 PM Leitha Bleak, [place]

## 2016-11-19 NOTE — Consult Note (Signed)
[place]  [phone] [phone]  ID: Spencer Acosta OB: [birth year]  MR#: [id number]  [id number]  Patient Care Team: Kirk Ruths, MD as PCP - General (Internal Medicine) Cleon Gustin, MD as Consulting Physician (Urology)  CHIEF COMPLAINT: Shortness of breath, pneumonia, bulky adenopathy secondary to her follicular lymphoma.  INTERVAL HISTORY: Patient is a 82 year old male recently diagnosed with follicular lymphoma and was intended to start treatment this week. He was admitted to the hospital with increasing shortness of breath and found to have community-acquired pneumonia. Currently, the patient feels improved. He denies any fevers, chills, night sweats, or weight loss. He he has a good appetite. He denies any chest pain or cough. He has no nausea, vomiting, constipation, or diarrhea. He has no urinary complaints. Patient otherwise feels well and offers no further specific complaints.  REVIEW OF SYSTEMS:   Review of Systems  Constitutional: Positive for malaise/fatigue. Negative for fever and weight loss.  Respiratory: Positive for shortness of breath. Negative for cough and hemoptysis.   Cardiovascular: Negative.  Negative for chest pain and leg swelling.  Gastrointestinal: Negative.  Negative for abdominal pain.  Genitourinary: Negative.   Musculoskeletal: Negative.   Neurological: Positive for weakness.  Psychiatric/Behavioral: Positive for memory loss. The patient is not nervous/anxious.     As per HPI. Otherwise, a complete review of systems is negative.  PAST MEDICAL HISTORY: Past Medical History:  Diagnosis Date  . A-fib ([place])   . CKD (chronic kidney disease)   . Crohn's disease ([place])   . Diabetes mellitus without complication ([place])   . Dysrhythmia   . GI bleed   . Hypertension   . Hypertriglyceridemia 03/11/2014  . Lymphoma ([place])   . PVD (peripheral vascular disease) ([place])     PAST SURGICAL HISTORY: Past  Surgical History:  Procedure Laterality Date  . APPENDECTOMY    . TURP VAPORIZATION      FAMILY HISTORY: Family History  Problem Relation Age of Onset  . Diabetes Other   . Diabetes Mother     ADVANCED DIRECTIVES (Y/N):  @ADVDIR @  HEALTH MAINTENANCE: Social History  Substance Use Topics  . Smoking status: Former Smoker    Quit date: 10/22/1947  . Smokeless tobacco: Never Used     Comment: quit 70 years ago  . Alcohol use No     Comment: occasional     Colonoscopy:  PAP:  Bone density:  Lipid panel:  Allergies  Allergen Reactions  . Sulfa Antibiotics Nausea And Vomiting    Current Facility-Administered Medications  Medication Dose Route Frequency Provider Last Rate Last Dose  . acetaminophen (TYLENOL) tablet 650 mg  650 mg Oral Q6H PRN Henreitta Leber, MD       Or  . acetaminophen (TYLENOL) suppository 650 mg  650 mg Rectal Q6H PRN Henreitta Leber, MD      . acidophilus (RISAQUAD) capsule 1 capsule  1 capsule Oral BID Henreitta Leber, MD   1 capsule at 11/19/16 0904  . albuterol (PROVENTIL) (2.5 MG/3ML) 0.083% nebulizer solution 2.5 mg  2.5 mg Nebulization Q6H PRN Henreitta Leber, MD      . allopurinol (ZYLOPRIM) tablet 300 mg  300 mg Oral Daily Henreitta Leber, MD   300 mg at 11/19/16 0904  . cholecalciferol (VITAMIN D) tablet 1,000 Units  1,000 Units Oral Daily Henreitta Leber, MD   1,000 Units at 11/19/16 [phone]  . guaiFENesin-dextromethorphan (ROBITUSSIN DM) 100-10 MG/5ML syrup 5 mL  5 mL  Oral Q4H PRN Henreitta Leber, MD   5 mL at 11/19/16 0905  . insulin aspart (novoLOG) injection 0-9 Units  0-9 Units Subcutaneous TID WC Henreitta Leber, MD      . latanoprost (XALATAN) 0.005 % ophthalmic solution 1 drop  1 drop Both Eyes QHS Henreitta Leber, MD   1 drop at 11/18/16 2100  . Mesalamine (ASACOL) DR capsule 800 mg  800 mg Oral TID Henreitta Leber, MD   800 mg at 11/19/16 0904  . [START ON 11/20/2016] methylPREDNISolone sodium succinate (SOLU-MEDROL) 40 mg/mL injection  40 mg  40 mg Intravenous Q24H Henreitta Leber, MD      . metoprolol tartrate (LOPRESSOR) tablet 25 mg  25 mg Oral BID Henreitta Leber, MD   25 mg at 11/19/16 0904  . multivitamin-lutein (OCUVITE-LUTEIN) capsule 1 capsule  1 capsule Oral Daily Henreitta Leber, MD   1 capsule at 11/19/16 0904  . ondansetron (ZOFRAN) tablet 4 mg  4 mg Oral Q6H PRN Henreitta Leber, MD       Or  . ondansetron (ZOFRAN) injection 4 mg  4 mg Intravenous Q6H PRN Henreitta Leber, MD      . piperacillin-tazobactam (ZOSYN) IVPB 3.375 g  3.375 g Intravenous Q8H Sheema M Hallaji, RPH   3.375 g at 11/19/16 1408  . Rivaroxaban (XARELTO) tablet 15 mg  15 mg Oral BID WC Henreitta Leber, MD   15 mg at 11/19/16 0904  . [START ON 11/29/2016] rivaroxaban (XARELTO) tablet 20 mg  20 mg Oral Q supper Henreitta Leber, MD        OBJECTIVE: Vitals:   11/18/16 2027 11/19/16 0400  BP: 130/66 (!) 132/93  Pulse: 79 78  Resp: 20 20  Temp: 99 F (37.2 C) 98.5 F (36.9 C)     Body mass index is 27.98 kg/m.    ECOG FS:1 - Symptomatic but completely ambulatory  General: Well-developed, well-nourished, no acute distress. Eyes: Pink conjunctiva, anicteric sclera. HEENT: Normocephalic, moist mucous membranes, clear oropharnyx. Lungs: Clear to auscultation bilaterally. Heart: Regular rate and rhythm. No rubs, murmurs, or gallops. Abdomen: Soft, nontender, nondistended. No organomegaly noted, normoactive bowel sounds. Musculoskeletal: No edema, cyanosis, or clubbing. Neuro: Alert, answering all questions appropriately. Cranial nerves grossly intact. Skin: No rashes or petechiae noted. Psych: Normal affect. Lymphatics: No cervical, calvicular, axillary or inguinal LAD.   LAB RESULTS:  Lab Results  Component Value Date   NA 143 11/19/2016   K 4.3 11/19/2016   CL 111 11/19/2016   CO2 27 11/19/2016   GLUCOSE 191 (H) 11/19/2016   BUN 61 (H) 11/19/2016   CREATININE 1.35 (H) 11/19/2016   CALCIUM 8.4 (L) 11/19/2016   PROT 6.2 (L)  11/18/2016   ALBUMIN 3.3 (L) 11/18/2016   AST 38 11/18/2016   ALT 75 (H) 11/18/2016   ALKPHOS 47 11/18/2016   BILITOT 0.9 11/18/2016   GFRNONAA 42 (L) 11/19/2016   GFRAA 48 (L) 11/19/2016    Lab Results  Component Value Date   WBC 14.9 (H) 11/19/2016   NEUTROABS 15.2 (H) 11/18/2016   HGB 10.3 (L) 11/19/2016   HCT 30.9 (L) 11/19/2016   MCV 95.2 11/19/2016   PLT 186 11/19/2016     STUDIES: Dg Chest 2 View  Result Date: 11/18/2016 CLINICAL DATA:  Cough. EXAM: CHEST  2 VIEW COMPARISON:  CT 11/14/2016.  Chest x-ray 03/17/2016. FINDINGS: Right upper lobe and right lower lobe pulmonary infiltrates consistent with pneumonia. Small right pleural effusion.  Cardiomegaly with normal pulmonary vascularity . IMPRESSION: Prominent right upper lobe and right lower lobe infiltrates consistent with pneumonia. Small right pleural effusion. Electronically Signed   By: Marcello Moores  Register   On: 11/18/2016 09:05   Ct Chest Wo Contrast  Result Date: 11/14/2016 CLINICAL DATA:  History of lymphoma. EXAM: CT CHEST WITHOUT CONTRAST TECHNIQUE: Multidetector CT imaging of the chest was performed following the standard protocol without IV contrast. COMPARISON:  None. FINDINGS: Cardiovascular: Moderate to marked cardiac enlargement. Aortic atherosclerosis noted. Calcification in the LAD and RCA coronary artery noted. No pericardial effusion. Mediastinum/Nodes: No supraclavicular or axillary lymph nodes. No significant mediastinal or hilar adenopathy. Lungs/Pleura: Small to moderate right pleural effusion identified. Airspace consolidation within the anterior right lower lobe is identified, image 117 of series 3. Right middle lobe subpleural nodule measures 4 mm, image 97 of series 3. Upper Abdomen: No acute abnormality identified. Stones identified within the gallbladder. There is abdominal aortic atherosclerosis. Retroperitoneal adenopathy as described on CT from 11/04/2016. Bilateral kidney cysts are present which are  incompletely characterized without IV contrast. Musculoskeletal: No chest wall mass or suspicious bone lesions identified. IMPRESSION: 1. No significant thoracic adenopathy identified. 2. Cardiac enlargement, aortic atherosclerosis and multi vessel coronary artery calcification. 3. Persistent right pleural effusion and right lower lobe airspace consolidation. 4. Upper abdominal adenopathy as described on CT from 11/04/2016. Electronically Signed   By: Kerby Moors M.D.   On: 11/14/2016 15:46   Ct Angio Aortobifemoral W And/or Wo Contrast  Result Date: 11/04/2016 CLINICAL DATA:  Acute presentation with right leg redness and swelling beginning yesterday. Painful. EXAM: CT ANGIOGRAPHY AOBIFEM WITHOUT AND WITH CONTRAST<Procedure Description>CT ANGIOGRAPHY AOBIFEM WITHOUT AND WITH CONTRAST TECHNIQUE: CT angiography of the aorta and lower extremities. CONTRAST:  100 cc Isovue 370 COMPARISON:  10/04/2016 . FINDINGS: Right pleural effusion with right lower lobe atelectasis. Liver is normal except for 1 cm low-density in the right lobe likely to be benign. Present previously and unchanged. Multiple calcified gallstones. 1.5 cm low-density in the pancreatic body as seen previously. Spleen is normal. Adrenal glands are normal. Renal cysts as previously seen. Massive progression of retroperitoneal all lymphadenopathy and right inguinal and iliac adenopathy since the previous study. The differential diagnosis is lymphoma versus metastatic disease. This quite likely results and venous obstruction of the right lower extremity. No primary bowel pathology. Bladder is unremarkable. Prostate gland is small. Chronic degenerative changes affect the spine. No destructive bone findings. There is diffuse aortic atherosclerosis. No aneurysm. Celiac origin shows 30% stenosis. Superior mesenteric artery origin is widely patent. No significant distal stenosis is seen. Bilateral renal artery atherosclerosis. Severe stenosis at the  proximal right renal artery, possibly 80% or greater. 30% stenosis of the left renal artery. Inferior mesenteric artery is patent with 30% proximal stenosis. Diffuse iliac atherosclerosis without stenosis more than 25 or 30%. Both common femoral artery show atherosclerosis but no stenosis greater than approximately 30%. There is slower flow to the left leg than the right, but the femoral popliteal arteries in the lower leg runoff vessels are patent bilaterally to the foot. There is diffuse swelling in of the right lower extremity with edema probably secondary to venous obstruction. Review of the MIP images confirms the above findings. IMPRESSION: Right pleural effusion with right lower lobe atelectasis. Massive retroperitoneal, right iliac and right inguinal lymphadenopathy, progressive since the previous study and resulting in venous obstruction of the right lower extremity. Swelling and subcutaneous edema of the right lower extremity secondary to venous obstruction. This  could be lymphoma or metastatic disease, with lymphoma favored. Aortic atherosclerosis. Atherosclerosis of the branch vessels. Severe stenosis of the right renal artery, 80% or greater. Slower arterial flow to the left leg relative to the right, but without significant lower extremity arterial occlusion or compromise on either side. Chololithiasis. No change in 1.5 cm pancreatic low-density. Electronically Signed   By: Nelson Chimes M.D.   On: 11/04/2016 19:23   US Biopsy  Result Date: 10/21/2016 CLINICAL DATA:  Right-sided inguinal, pelvic and abdominal retroperitoneal lymphadenopathy. The patient presents for ultrasound-guided biopsy of enlarged right inguinal lymph nodes. EXAM: ULTRASOUND GUIDED CORE BIOPSY OF RIGHT INGUINAL LYMPH NODE MASS MEDICATIONS: 0.5 mg IV Versed; 25 mcg IV Fentanyl Total Moderate Sedation Time: 19 minutes. The patient's level of consciousness and physiologic status were continuously monitored during the procedure by  Radiology nursing. PROCEDURE: The procedure, risks, benefits, and alternatives were explained to the patient. Questions regarding the procedure were encouraged and answered. The patient understands and consents to the procedure. A time out was performed prior to initiating the procedure. The right groin region was prepped with chlorhexidine in a sterile fashion, and a sterile drape was applied covering the operative field. A sterile gown and sterile gloves were used for the procedure. Local anesthesia was provided with 1% Lidocaine. Ultrasound was used to localize right inguinal lymphadenopathy. Under direct ultrasound guidance, a total of 5 separate 16 gauge core biopsy samples were obtained in different portions of an enlarged lymph node mass in the lower right inguinal region extending into the proximal right thigh. Material was submitted on Telfa soaked in saline. COMPLICATIONS: None. FINDINGS: Multiple enlarged hypoechoic lymph nodes are identified in the right groin and proximal right thigh. Solid tissue was obtained from a dominant enlarged lymph node measuring approximately 7 cm in greatest length. IMPRESSION: Ultrasound-guided core biopsy performed of enlarged right inguinal lymph node. Electronically Signed   By: Aletta Edouard M.D.   On: 10/21/2016 10:29   US Venous Img Lower Unilateral Right  Result Date: 11/04/2016 CLINICAL DATA:  Right lower extremity pain and swelling for 2 weeks. EXAM: Right LOWER EXTREMITY VENOUS DOPPLER ULTRASOUND TECHNIQUE: Gray-scale sonography with graded compression, as well as color Doppler and duplex ultrasound were performed to evaluate the lower extremity deep venous systems from the level of the common femoral vein and including the common femoral, femoral, profunda femoral, popliteal and calf veins including the posterior tibial, peroneal and gastrocnemius veins when visible. The superficial great saphenous vein was also interrogated. Spectral Doppler was utilized to  evaluate flow at rest and with distal augmentation maneuvers in the common femoral, femoral and popliteal veins. COMPARISON:  None. FINDINGS: Contralateral Common Femoral Vein: Respiratory phasicity is normal and symmetric with the symptomatic side. No evidence of thrombus. Normal compressibility. Common Femoral Vein: Nonocclusive thrombus is noted. Partial flow is noted. Saphenofemoral Junction: No evidence of thrombus. Normal compressibility and flow on color Doppler imaging. Profunda Femoral Vein: No evidence of thrombus. Normal flow on color Doppler imaging. Unable to compress due to large mass in right groin. Femoral Vein: No evidence of thrombus. Normal respiratory phasicity and response to augmentation. Unable to compress due to large mass in right groin. Popliteal Vein: No evidence of thrombus. Normal compressibility, respiratory phasicity and response to augmentation. Calf Veins: No evidence of thrombus. Normal compressibility and flow on color Doppler imaging. Superficial Great Saphenous Vein: No evidence of thrombus. Normal compressibility and flow on color Doppler imaging. Venous Reflux:  None. Other Findings: Multiple large right  inguinal lymph nodes are noted, with the largest measuring 8.5 x 5.8 x 4.8 cm. This lymph node has been recently biopsied. IMPRESSION: Acute nonocclusive deep venous thrombosis seen in right common femoral vein. Large right inguinal lymph node or mass is noted which has been previously biopsied. Critical Value/emergent results were called by telephone at the time of interpretation on 11/04/2016 at 7:46 pm to Dr. Lenise Arena , who verbally acknowledged these results. Electronically Signed   By: Marijo Conception, M.D.   On: 11/04/2016 19:43    ASSESSMENT: Shortness of breath, pneumonia, bulky adenopathy secondary to her follicular lymphoma.  PLAN:    1. Follicular lymphoma: Patient was initially supposed to start treatment this week under the care of Dr. Mike Gip, but  this will be delayed at least one week until after Christmas. Patient will have follow-up on November 25, 2016 in the [facility] for further evaluation. 2. Pneumonia: Agree with current antibiotics. 3. Leukocytosis: Likely secondary to underlying infection, monitor.  Appreciate consult, call with questions.  Lloyd Huger, MD   11/19/2016 2:53 PM

## 2016-11-19 NOTE — Care Management Important Message (Signed)
Important Message  Patient Details  Name: Spencer Acosta MRN: [id number] Date of Birth: [birth year]   Medicare Important Message Given:  Yes    Shelbie Ammons, RN 11/19/2016, 11:50 AM

## 2016-11-19 NOTE — NC FL2 (Signed)
[place] LEVEL OF CARE SCREENING TOOL     IDENTIFICATION  Patient Name: Spencer Acosta Birthdate: [birth year] Sex: male Admission Date (Current Location): 11/18/2016  [place] and [place] Number:  Engineering geologist and Address:  [facility], [address] [place], [place] [address]      Provider Number: [id number]  Attending Physician Name and Address:  Henreitta Leber, MD  Relative Name and Phone Number:  Snyder,[phone] or Dinaboli,Shan Daughter   [phone] or Joahan, Mathiesen   [phone] or Wolf,Cherese Other  [phone]     Current Level of Care: Hospital Recommended Level of Care: [place] Prior Approval Number:    Date Approved/Denied:   PASRR Number: [id number]  Discharge Plan: SNF    Current Diagnoses: Patient Active Problem List   Diagnosis Date Noted  . Pneumonia 11/18/2016  . Acute cystitis 11/17/2016  . Bilateral renal cysts, <2cm non-complex.  11/17/2016  . Cellulitis of right leg 11/04/2016  . Right leg DVT ([place]) 11/04/2016  . Lymphoma ([place]) 11/04/2016  . CKD (chronic kidney disease), stage III 11/04/2016  . Gross hematuria 10/10/2016  . Inguinal lymphadenopathy 10/10/2016  . Atrial fibrillation ([place]) 03/20/2016  . Colitis 02/19/2016  . Bleeding hemorrhoid 07/19/2015  . Atherosclerosis of aorta ([place]) 10/01/2014  . Crohn's disease of colon ([place]) 03/11/2014  . BP (high blood pressure) 03/11/2014  . Hypertriglyceridemia 03/11/2014  . Peripheral vascular disease ([place]) 03/11/2014  . Type 2 diabetes mellitus ([place]) 03/11/2014    Orientation RESPIRATION BLADDER Height & Weight     Self  Normal Continent Weight: 184 lb (83.5 kg) Height:  5\' 8"  (172.7 cm)  BEHAVIORAL SYMPTOMS/MOOD NEUROLOGICAL BOWEL NUTRITION STATUS      Continent Diet (Soft foods)  AMBULATORY STATUS COMMUNICATION OF NEEDS Skin   Limited Assist Verbally Normal                        Personal Care Assistance Level of Assistance  Bathing, Feeding, Dressing Bathing Assistance: Limited assistance Feeding assistance: Independent Dressing Assistance: Limited assistance     Functional Limitations Info  Sight, Hearing, Speech Sight Info: Adequate Hearing Info: Impaired Speech Info: Adequate    SPECIAL CARE FACTORS FREQUENCY  PT (By licensed PT)     PT Frequency: 5x a week              Contractures Contractures Info: Not present    Additional Factors Info  Code Status, Allergies, Insulin Sliding Scale Code Status Info: DNR Allergies Info: SULFA ANTIBIOTICS    Insulin Sliding Scale Info: insulin aspart (novoLOG) injection 0-9 Units       Current Medications (11/19/2016):  This is the current hospital active medication list Current Facility-Administered Medications  Medication Dose Route Frequency Provider Last Rate Last Dose  . acetaminophen (TYLENOL) tablet 650 mg  650 mg Oral Q6H PRN Henreitta Leber, MD       Or  . acetaminophen (TYLENOL) suppository 650 mg  650 mg Rectal Q6H PRN Henreitta Leber, MD      . acidophilus (RISAQUAD) capsule 1 capsule  1 capsule Oral BID Henreitta Leber, MD   1 capsule at 11/19/16 0904  . albuterol (PROVENTIL) (2.5 MG/3ML) 0.083% nebulizer solution 2.5 mg  2.5 mg Nebulization Q6H PRN Henreitta Leber, MD      . allopurinol (ZYLOPRIM) tablet 300 mg  300 mg Oral Daily Henreitta Leber, MD   300 mg at 11/19/16 0904  .  cholecalciferol (VITAMIN D) tablet 1,000 Units  1,000 Units Oral Daily Henreitta Leber, MD   1,000 Units at 11/19/16 [phone]  . guaiFENesin-dextromethorphan (ROBITUSSIN DM) 100-10 MG/5ML syrup 5 mL  5 mL Oral Q4H PRN Henreitta Leber, MD   5 mL at 11/19/16 0905  . insulin aspart (novoLOG) injection 0-9 Units  0-9 Units Subcutaneous TID WC Henreitta Leber, MD   2 Units at 11/19/16 1731  . latanoprost (XALATAN) 0.005 % ophthalmic solution 1 drop  1 drop Both Eyes QHS Henreitta Leber, MD   1 drop at 11/18/16 2100  .  Mesalamine (ASACOL) DR capsule 800 mg  800 mg Oral TID Henreitta Leber, MD   800 mg at 11/19/16 1731  . [START ON 11/20/2016] methylPREDNISolone sodium succinate (SOLU-MEDROL) 40 mg/mL injection 40 mg  40 mg Intravenous Q24H Henreitta Leber, MD      . metoprolol tartrate (LOPRESSOR) tablet 25 mg  25 mg Oral BID Henreitta Leber, MD   25 mg at 11/19/16 0904  . multivitamin-lutein (OCUVITE-LUTEIN) capsule 1 capsule  1 capsule Oral Daily Henreitta Leber, MD   1 capsule at 11/19/16 0904  . ondansetron (ZOFRAN) tablet 4 mg  4 mg Oral Q6H PRN Henreitta Leber, MD       Or  . ondansetron (ZOFRAN) injection 4 mg  4 mg Intravenous Q6H PRN Henreitta Leber, MD      . piperacillin-tazobactam (ZOSYN) IVPB 3.375 g  3.375 g Intravenous Q8H Sheema M Hallaji, RPH   3.375 g at 11/19/16 1408  . Rivaroxaban (XARELTO) tablet 15 mg  15 mg Oral BID WC Henreitta Leber, MD   15 mg at 11/19/16 1731  . [START ON 11/29/2016] rivaroxaban (XARELTO) tablet 20 mg  20 mg Oral Q supper Henreitta Leber, MD         Discharge Medications: Please see discharge summary for a list of discharge medications.  Relevant Imaging Results:  Relevant Lab Results:   Additional Information SSN:  [id number]  Anell Barr

## 2016-11-19 NOTE — Progress Notes (Signed)
[facility] [place] NAME: Spencer Acosta    MR#:  [id number]  DATE OF BIRTH:  06/06/1915  SUBJECTIVE:   Is here due to shortness of breath secondary to pneumonia. Daughter at bedside. Feels a bit better today.  REVIEW OF SYSTEMS:    Review of Systems  Constitutional: Negative for chills and fever.  HENT: Negative for congestion and tinnitus.   Eyes: Negative for blurred vision and double vision.  Respiratory: Positive for cough and shortness of breath. Negative for wheezing.   Cardiovascular: Negative for chest pain, orthopnea and PND.  Gastrointestinal: Negative for abdominal pain, diarrhea, nausea and vomiting.  Genitourinary: Negative for dysuria and hematuria.  Neurological: Positive for weakness. Negative for dizziness, sensory change and focal weakness.  All other systems reviewed and are negative.   Nutrition: Soft diet Tolerating Diet: Yes Tolerating PT: Await Eval.      DRUG ALLERGIES:   Allergies  Allergen Reactions  . Sulfa Antibiotics Nausea And Vomiting    VITALS:  Blood pressure (!) 132/93, pulse 78, temperature 98.5 F (36.9 C), resp. rate 20, height 5\' 8"  (1.727 m), weight 83.5 kg (184 lb), SpO2 93 %.  PHYSICAL EXAMINATION:   Physical Exam  GENERAL:  82 y.o.-year-old patient lying in the bed in NAD.   EYES: Pupils equal, round, reactive to light and accommodation. No scleral icterus. Extraocular muscles intact.  HEENT: Head atraumatic, normocephalic. Oropharynx and nasopharynx clear.  NECK:  Supple, no jugular venous distention. No thyroid enlargement, no tenderness.  LUNGS:  Good A/E b/l, no wheezing, rales, b/l rhonchi. No use of accessory muscles of respiration.  CARDIOVASCULAR: S1, S2 normal. No murmurs, rubs, or gallops.  ABDOMEN: Soft, nontender, nondistended. Bowel sounds present. No organomegaly or mass.  EXTREMITIES: No cyanosis, clubbing or edema b/l.    NEUROLOGIC: Cranial nerves II through XII  are intact. No focal Motor or sensory deficits b/l. Globally weak.    PSYCHIATRIC: The patient is alert and oriented x 3.  SKIN: No obvious rash, lesion, or ulcer.    LABORATORY PANEL:   CBC  Recent Labs Lab 11/19/16 0505  WBC 14.9*  HGB 10.3*  HCT 30.9*  PLT 186   ------------------------------------------------------------------------------------------------------------------  Chemistries   Recent Labs Lab 11/18/16 0832 11/19/16 0505  NA 142 143  K 4.0 4.3  CL 110 111  CO2 27 27  GLUCOSE 164* 191*  BUN 58* 61*  CREATININE 1.16 1.35*  CALCIUM 8.5* 8.4*  AST 38  --   ALT 75*  --   ALKPHOS 47  --   BILITOT 0.9  --    ------------------------------------------------------------------------------------------------------------------  Cardiac Enzymes  Recent Labs Lab 11/18/16 0832  TROPONINI 0.05*   ------------------------------------------------------------------------------------------------------------------  RADIOLOGY:  Dg Chest 2 View  Result Date: 11/18/2016 CLINICAL DATA:  Cough. EXAM: CHEST  2 VIEW COMPARISON:  CT 11/14/2016.  Chest x-ray 03/17/2016. FINDINGS: Right upper lobe and right lower lobe pulmonary infiltrates consistent with pneumonia. Small right pleural effusion. Cardiomegaly with normal pulmonary vascularity . IMPRESSION: Prominent right upper lobe and right lower lobe infiltrates consistent with pneumonia. Small right pleural effusion. Electronically Signed   By: Marcello Moores  Register   On: 11/18/2016 09:05     ASSESSMENT AND PLAN:   82 year old male with past medical history of peripheral asked disease, hypertension, hyperlipidemia, previous history of GI bleed, diabetes, Crohn's disease, chronic stage III, chronic atrial fibrillation, recent diagnosis of a right lower extremity DVT who presents to the hospital due to cough, shortness  of breath and weakness.   1. Acute respiratory failure with hypoxia-secondary to pneumonia. -Continue IV  Zosyn, O2 supplementation. Improving.  2. Pneumonia-patient noted to have chest x-ray and CT scan findings suggestive of right upper lobe and lower lobe infiltrates. - cont. IV Zosyn, follow cultures which are negative so far. Continue IV steroids but will taper as bronchospasm is improved.  3. Recent history of a right lower extremity DVT-continue Xarelto.  4. Leukocytosis-secondary to the pneumonia. Improving.  5. Recent diagnosis of follicular lymphoma-patient has not started treatment yet. -Patient is followed by Dr. Mike Gip. Await further Oncology input.   6. History of gout-no acute attack. Continue allopurinol.  7. History of Crohn's disease-continue mesalamine.  8. Essential hypertension-continue metoprolol.  9. Glaucoma-continue latanoprost eyedrops.  Await PT Eval.   All the records are reviewed and case discussed with Care Management/Social Worker. Management plans discussed with the patient, family and they are in agreement.  CODE STATUS: DNR  DVT Prophylaxis: Xarelto  TOTAL TIME TAKING CARE OF THIS PATIENT: 30 minutes.   POSSIBLE D/C IN 2-3 DAYS, DEPENDING ON CLINICAL CONDITION.   Henreitta Leber M.D on 11/19/2016 at 2:33 PM  Between 7am to 6pm - Pager - [phone]  After 6pm go to [email or web address] - Proofreader  [facility] [place] [facility]  Office  [phone]  CC: Primary care physician; Kirk Ruths., MD

## 2016-11-19 NOTE — Clinical Social Work Note (Addendum)
CSW received referral for patient and his family wanting him to go to [facility] for short term rehab.  Patient is from [place] and family does not feel safe having patient return without some physical therapy at SNF first.  Patient lives alone and has caregivers two hours a day to assist.  CSW was given permission to fax patient to [facility] for them to review.  Formal assessment to follow.  Jones Broom. [place], MSW, [place]  Mon-Fri 8a-4:30p 11/19/2016 6:07 PM

## 2016-11-20 ENCOUNTER — Telehealth: Payer: Self-pay | Admitting: *Deleted

## 2016-11-20 LAB — GLUCOSE, CAPILLARY
GLUCOSE-CAPILLARY: 137 mg/dL — AB (ref 65–99)
GLUCOSE-CAPILLARY: 145 mg/dL — AB (ref 65–99)
GLUCOSE-CAPILLARY: 184 mg/dL — AB (ref 65–99)
GLUCOSE-CAPILLARY: 264 mg/dL — AB (ref 65–99)

## 2016-11-20 LAB — BASIC METABOLIC PANEL
ANION GAP: 6 (ref 5–15)
BUN: 75 mg/dL — ABNORMAL HIGH (ref 6–20)
CHLORIDE: 113 mmol/L — AB (ref 101–111)
CO2: 26 mmol/L (ref 22–32)
Calcium: 8.3 mg/dL — ABNORMAL LOW (ref 8.9–10.3)
Creatinine, Ser: 1.42 mg/dL — ABNORMAL HIGH (ref 0.61–1.24)
GFR calc non Af Amer: 39 mL/min — ABNORMAL LOW (ref >60)
GFR, EST AFRICAN AMERICAN: 45 mL/min — AB (ref >60)
Glucose, Bld: 147 mg/dL — ABNORMAL HIGH (ref 65–99)
POTASSIUM: 4.2 mmol/L (ref 3.5–5.1)
Sodium: 145 mmol/L (ref 135–145)

## 2016-11-20 LAB — HEMOGLOBIN
HEMOGLOBIN: 9.8 g/dL — AB (ref 13.0–18.0)
HEMOGLOBIN: 9.8 g/dL — AB (ref 13.0–18.0)
Hemoglobin: 10.4 g/dL — ABNORMAL LOW (ref 13.0–18.0)

## 2016-11-20 LAB — CBC
HEMATOCRIT: 28.1 % — AB (ref 40.0–52.0)
HEMOGLOBIN: 9.4 g/dL — AB (ref 13.0–18.0)
MCH: 31.7 pg (ref 26.0–34.0)
MCHC: 33.5 g/dL (ref 32.0–36.0)
MCV: 94.7 fL (ref 80.0–100.0)
Platelets: 173 10*3/uL (ref 150–440)
RBC: 2.97 MIL/uL — AB (ref 4.40–5.90)
RDW: 15.4 % — ABNORMAL HIGH (ref 11.5–14.5)
WBC: 17.9 10*3/uL — AB (ref 3.8–10.6)

## 2016-11-20 MED ORDER — BUDESONIDE 0.5 MG/2ML IN SUSP
0.5000 mg | Freq: Two times a day (BID) | RESPIRATORY_TRACT | Status: DC
  Administered 2016-11-20 – 2016-11-26 (×13): 0.5 mg via RESPIRATORY_TRACT
  Filled 2016-11-20 (×14): qty 2

## 2016-11-20 MED ORDER — IPRATROPIUM-ALBUTEROL 0.5-2.5 (3) MG/3ML IN SOLN
3.0000 mL | Freq: Four times a day (QID) | RESPIRATORY_TRACT | Status: DC
  Administered 2016-11-20 – 2016-11-24 (×18): 3 mL via RESPIRATORY_TRACT
  Filled 2016-11-20 (×19): qty 3

## 2016-11-20 MED ORDER — APIXABAN 5 MG PO TABS: 5.0000 mg | ORAL_TABLET | Freq: Two times a day (BID) | ORAL | Status: DC

## 2016-11-20 NOTE — Progress Notes (Signed)
Dr Hamilton Capri notified that patient has blood in urine, spitting up blood and is taking xarelto BID. MD acknowledged and ordered stat Hgb.

## 2016-11-20 NOTE — Progress Notes (Signed)
PT Cancellation Note  Patient Details Name: Spencer Acosta MRN: [id number] DOB: 01-08-1915   Cancelled Treatment:    Reason Eval/Treat Not Completed: Other (comment).  Nursing reporting pt recently with blood from rectum and has had blood in urine and spitting up blood today and recommending holding PT.  Will re-attempt PT treatment at a later date/time as medically appropriate.  Leitha Bleak 11/20/2016, 5:26 PM Leitha Bleak, [place]

## 2016-11-20 NOTE — Telephone Encounter (Signed)
Called daughter about the cost of Leukeran at $25.69 a pill and she reported that that was too expensive for him to afford on his limited income.  Daughter reports that patient is currently in the hospital for pneumonia and will be going into the health facility at [facility] on discharge.

## 2016-11-20 NOTE — Progress Notes (Signed)
[facility] [place] NAME: Spencer Acosta    MR#:  [id number]  DATE OF BIRTH:  10-15-1915  SUBJECTIVE:   Having some wheezing/bronchospam today.  Also noted to have some hematuria and some heme + stools.    REVIEW OF SYSTEMS:    Review of Systems  Constitutional: Negative for chills and fever.  HENT: Negative for congestion and tinnitus.   Eyes: Negative for blurred vision and double vision.  Respiratory: Positive for cough and shortness of breath. Negative for wheezing.   Cardiovascular: Negative for chest pain, orthopnea and PND.  Gastrointestinal: Positive for blood in stool. Negative for abdominal pain, diarrhea, nausea and vomiting.  Genitourinary: Positive for hematuria. Negative for dysuria.  Neurological: Positive for weakness. Negative for dizziness, sensory change and focal weakness.  All other systems reviewed and are negative.   Nutrition: Soft diet Tolerating Diet: Yes Tolerating PT: Await Eval.    DRUG ALLERGIES:   Allergies  Allergen Reactions  . Sulfa Antibiotics Nausea And Vomiting    VITALS:  Blood pressure 135/77, pulse 88, temperature 98.1 F (36.7 C), temperature source Oral, resp. rate 20, height 5\' 8"  (1.727 m), weight 83.5 kg (184 lb), SpO2 97 %.  PHYSICAL EXAMINATION:   Physical Exam  GENERAL:  82 y.o.-year-old patient lying in the bed in NAD.   EYES: Pupils equal, round, reactive to light and accommodation. No scleral icterus. Extraocular muscles intact.  HEENT: Head atraumatic, normocephalic. Oropharynx and nasopharynx clear.  NECK:  Supple, no jugular venous distention. No thyroid enlargement, no tenderness.  LUNGS:  Good A/E b/l, b/l wheezing, No rales, b/l rhonchi. No use of accessory muscles of respiration.  CARDIOVASCULAR: S1, S2 normal. No murmurs, rubs, or gallops.  ABDOMEN: Soft, nontender, nondistended. Bowel sounds present. No organomegaly or mass.  EXTREMITIES: No cyanosis, clubbing or  edema b/l.    NEUROLOGIC: Cranial nerves II through XII are intact. No focal Motor or sensory deficits b/l. Globally weak.    PSYCHIATRIC: The patient is alert and oriented x 3.  SKIN: No obvious rash, lesion, or ulcer.    LABORATORY PANEL:   CBC  Recent Labs Lab 11/20/16 0535 11/20/16 1135  WBC 17.9*  --   HGB 9.4* 9.8*  HCT 28.1*  --   PLT 173  --    ------------------------------------------------------------------------------------------------------------------  Chemistries   Recent Labs Lab 11/18/16 0832  11/20/16 0535  NA 142  < > 145  K 4.0  < > 4.2  CL 110  < > 113*  CO2 27  < > 26  GLUCOSE 164*  < > 147*  BUN 58*  < > 75*  CREATININE 1.16  < > 1.42*  CALCIUM 8.5*  < > 8.3*  AST 38  --   --   ALT 75*  --   --   ALKPHOS 47  --   --   BILITOT 0.9  --   --   < > = values in this interval not displayed. ------------------------------------------------------------------------------------------------------------------  Cardiac Enzymes  Recent Labs Lab 11/18/16 0832  TROPONINI 0.05*   ------------------------------------------------------------------------------------------------------------------  RADIOLOGY:  No results found.   ASSESSMENT AND PLAN:   82 year old male with past medical history of peripheral asked disease, hypertension, hyperlipidemia, previous history of GI bleed, diabetes, Crohn's disease, chronic stage III, chronic atrial fibrillation, recent diagnosis of a right lower extremity DVT who presents to the hospital due to cough, shortness of breath and weakness.   1. Acute respiratory failure with hypoxia-secondary to  pneumonia. -Continue IV Zosyn, O2 supplementation. Slow to improve.    2. Pneumonia-patient noted to have chest x-ray and CT scan findings suggestive of right upper lobe and lower lobe infiltrates. - cont. IV Zosyn, follow cultures which are negative so far.  - Continue IV steroids as still has some  wheezing/bronchospasm.   3. Recent history of a right lower extremity DVT- having some bleeding and will follow Serial Hg. - switch from Xarelto to Eliquis due to pt. Worsening renal function.     4. Leukocytosis-secondary to the pneumonia. Improving.  5. Recent diagnosis of follicular lymphoma-patient has not started treatment yet. -Patient is followed by Dr. Mike Gip.  Await further Oncology input.   6. History of gout-no acute attack. Continue allopurinol.  7. History of Crohn's disease-continue mesalamine. - pt. Having some heme + stools and could be due to pt. Being on Xarelto. Will switch to Eliquis and monitor.   8. Essential hypertension-continue metoprolol.  9. Glaucoma-continue latanoprost eyedrops.  PT recommending Home with Health but daughter wants pt. To go to skilled side of [facility] and will make social work aware.   All the records are reviewed and case discussed with Care Management/Social Worker. Management plans discussed with the patient, family and they are in agreement.  CODE STATUS: DNR  DVT Prophylaxis: Xarelto  TOTAL TIME TAKING CARE OF THIS PATIENT: 35 minutes.   POSSIBLE D/C IN 2-3 DAYS, DEPENDING ON CLINICAL CONDITION.   Henreitta Leber M.D on 11/20/2016 at 2:16 PM  Between 7am to 6pm - Pager - [phone]  After 6pm go to [email or web address] - Proofreader  [facility] [place] [facility]  Office  [phone]  CC: Primary care physician; Kirk Ruths., MD

## 2016-11-20 NOTE — Progress Notes (Signed)
Dr Hamilton Capri notified that patient had large BM with blood from rectum. MD acknowledged, discontinued eliquis.

## 2016-11-21 ENCOUNTER — Inpatient Hospital Stay: Payer: Medicare Other

## 2016-11-21 ENCOUNTER — Inpatient Hospital Stay: Payer: Medicare Other | Admitting: Oncology

## 2016-11-21 LAB — BASIC METABOLIC PANEL
Anion gap: 7 (ref 5–15)
BUN: 69 mg/dL — AB (ref 6–20)
CALCIUM: 8.2 mg/dL — AB (ref 8.9–10.3)
CHLORIDE: 113 mmol/L — AB (ref 101–111)
CO2: 23 mmol/L (ref 22–32)
CREATININE: 1.42 mg/dL — AB (ref 0.61–1.24)
GFR calc Af Amer: 45 mL/min — ABNORMAL LOW (ref >60)
GFR calc non Af Amer: 39 mL/min — ABNORMAL LOW (ref >60)
GLUCOSE: 227 mg/dL — AB (ref 65–99)
Potassium: 4.3 mmol/L (ref 3.5–5.1)
Sodium: 143 mmol/L (ref 135–145)

## 2016-11-21 LAB — HEMOGLOBIN
HEMOGLOBIN: 9.1 g/dL — AB (ref 13.0–18.0)
Hemoglobin: 9.4 g/dL — ABNORMAL LOW (ref 13.0–18.0)

## 2016-11-21 LAB — GLUCOSE, CAPILLARY
GLUCOSE-CAPILLARY: 297 mg/dL — AB (ref 65–99)
Glucose-Capillary: 177 mg/dL — ABNORMAL HIGH (ref 65–99)
Glucose-Capillary: 160 mg/dL — ABNORMAL HIGH (ref 65–99)
Glucose-Capillary: 241 mg/dL — ABNORMAL HIGH (ref 65–99)

## 2016-11-21 MED ORDER — INSULIN ASPART 100 UNIT/ML ~~LOC~~ SOLN
0.0000 [IU] | Freq: Three times a day (TID) | SUBCUTANEOUS | Status: DC
  Administered 2016-11-21: 17:00:00 5 [IU] via SUBCUTANEOUS
  Administered 2016-11-22 (×2): 3 [IU] via SUBCUTANEOUS
  Administered 2016-11-22: 5 [IU] via SUBCUTANEOUS
  Administered 2016-11-23: 08:00:00 3 [IU] via SUBCUTANEOUS
  Administered 2016-11-23: 13:00:00 2 [IU] via SUBCUTANEOUS
  Administered 2016-11-23: 5 [IU] via SUBCUTANEOUS
  Administered 2016-11-24: 3 [IU] via SUBCUTANEOUS
  Administered 2016-11-24: 17:00:00 5 [IU] via SUBCUTANEOUS
  Administered 2016-11-25: 2 [IU] via SUBCUTANEOUS
  Administered 2016-11-25: 17:00:00 3 [IU] via SUBCUTANEOUS
  Administered 2016-11-26: 12:00:00 5 [IU] via SUBCUTANEOUS
  Filled 2016-11-21: qty 5
  Filled 2016-11-21 (×2): qty 3
  Filled 2016-11-21: qty 5
  Filled 2016-11-21 (×2): qty 3
  Filled 2016-11-21: qty 2
  Filled 2016-11-21: qty 3
  Filled 2016-11-21 (×3): qty 5

## 2016-11-21 MED ORDER — APIXABAN 5 MG PO TABS
5.0000 mg | ORAL_TABLET | Freq: Two times a day (BID) | ORAL | Status: DC
  Administered 2016-11-21: 12:00:00 5 mg via ORAL
  Filled 2016-11-21 (×2): qty 1

## 2016-11-21 NOTE — Progress Notes (Signed)
Pharmacy Antibiotic Note  Spencer Acosta is a 82 y.o. male admitted on 11/18/2016 with pneumonia/HCAP.  Pharmacy has been consulted for Vancomycin and Zosyn dosing. Vancomycin D/Cd 12/19  Plan: Day 4 of IV ABx. Continue patient on Zosyn 3.375 IV EI every 8 hours.   Height: 5\' 8"  (172.7 cm) Weight: 184 lb (83.5 kg) IBW/kg (Calculated) : 68.4  Temp (24hrs), Avg:98.2 F (36.8 C), Min:98.1 F (36.7 C), Max:98.2 F (36.8 C)   Recent Labs Lab 11/17/16 0821 11/18/16 0832 11/18/16 1220 11/19/16 0505 11/20/16 0535 11/21/16 0212  WBC 15.1* 17.8*  --  14.9* 17.9*  --   CREATININE 1.18 1.16  --  1.35* 1.42* 1.42*  LATICACIDVEN  --  2.0* 1.3  --   --   --     Estimated Creatinine Clearance: 29.8 mL/min (by C-G formula based on SCr of 1.42 mg/dL (H)).    Allergies  Allergen Reactions  . Sulfa Antibiotics Nausea And Vomiting    Antimicrobials this admission: 12/19 Vancomycin >> 12/19 12/19 Zosyn >>   Dose adjustments this admission:   Microbiology results: BCx: NG x 3 days 12/19 UCx: >=100,000 COLONIES/mL ENTEROCOCCUS FAECALIS  - Susceptibilities pending  12/05 MRSA PCR: negative   Thank you for allowing pharmacy to be a part of this patient's care.  Pernell Dupre, PharmD, BCPS Clinical Pharmacist 11/21/2016 7:56 AM

## 2016-11-21 NOTE — Progress Notes (Signed)
Physical Therapy Treatment Patient Details Name: Spencer Acosta MRN: [id number] DOB: 10-05-1915 Today's Date: 11/21/2016    History of Present Illness Pt is a very pleasant 82 y.o. male presenting to hospital with weakness, nausea, and SOB.  Pt with recent dx of follicular lymphoma and R LE DVT.  Pt admitted with acute respiratory failure with hypoxia secondary to multilobar PNA.  PMH includes a-fib, CKD, Crohn's disease, DM, htn, PVD, R LE DVT 11/04/16.    PT Comments    Pt able to progress to ambulating 40 feet with RW CGA but distance limited d/t fatigue.  Pt overall appearing deconditioned with decreased activity tolerance and requiring increased effort for all activity compared to initial PT evaluation.  At this time pt appears more appropriate for STR (SW notified).  Will continue to progress pt with strengthening and progressing ambulation distance per pt tolerance.   Follow Up Recommendations  SNF     Equipment Recommendations   (pt already owns RW)    Recommendations for Other Services       Precautions / Restrictions Precautions Precautions: Fall Restrictions Weight Bearing Restrictions: No    Mobility  Bed Mobility Overal bed mobility: Needs Assistance Bed Mobility: Supine to Sit     Supine to sit: Min guard;HOB elevated     General bed mobility comments: increased effort and time to perform on own; use of side rail  Transfers Overall transfer level: Needs assistance Equipment used: Rolling walker (2 wheeled) Transfers: Sit to/from Stand Sit to Stand: Min guard         General transfer comment: x1 trial from bed; x1 trial from recliner; increased effort to stand on own; vc's required for hand placement on RW  Ambulation/Gait Ambulation/Gait assistance: Min guard Ambulation Distance (Feet): 40 Feet Assistive device: Rolling walker (2 wheeled)   Gait velocity: decreased   General Gait Details: decreased B step length/foot clearance/heelstrike; limited  distance d/t fatigue; vc's to stay closer to RW intermittently   Stairs            Wheelchair Mobility    Modified Rankin (Stroke Patients Only)       Balance Overall balance assessment: Needs assistance Sitting-balance support: Bilateral upper extremity supported;Feet supported Sitting balance-Leahy Scale: Good     Standing balance support: Bilateral upper extremity supported (on RW) Standing balance-Leahy Scale: Fair                      Cognition Arousal/Alertness: Awake/alert Behavior During Therapy: WFL for tasks assessed/performed Overall Cognitive Status: History of cognitive impairments - at baseline (Oriented to person)                      Exercises      General Comments   Nursing cleared pt for participation in physical therapy (nursing reporting MD requesting pt to have PT today).  Pt agreeable to PT session.  Discussed pt's h/o blood clot and recent change in anti-coagulation medication with MD Verdell Carmine and MD cleared pt to ambulate and participate in PT.       Pertinent Vitals/Pain Pain Assessment: No/denies pain  Vitals (HR and O2) stable and WFL throughout treatment session.    Home Living                      Prior Function            PT Goals (current goals can now be found in the care plan section)  Acute Rehab PT Goals Patient Stated Goal: to go for a walk PT Goal Formulation: With patient/family Time For Goal Achievement: 12/03/16 Potential to Achieve Goals: Good Additional Goals Additional Goal #1: Perform objective balance assessment. Progress towards PT goals: Progressing toward goals    Frequency    Min 2X/week      PT Plan Discharge plan needs to be updated (SW notified)    Co-evaluation             End of Session Equipment Utilized During Treatment: Gait belt Activity Tolerance: Patient limited by fatigue Patient left: in chair;with call bell/phone within reach;with chair alarm set (B  heels elevated via pillow)     Time: [id number] PT Time Calculation (min) (ACUTE ONLY): 23 min  Charges:  $Gait Training: 8-22 mins $Therapeutic Activity: 8-22 mins                    G CodesLeitha Bleak 12-20-16, 2:41 PM Leitha Bleak, [place]

## 2016-11-21 NOTE — Clinical Social Work Note (Signed)
CSW spoke with patient and his family and presented bed offers.  Patient and family have been offered a bed at [facility].  CSW contacted [facility], and they can accept patient once he is medically ready for discharge and orders have been received.  Jones Broom. [place], MSW, [place]  Mon-Fri 8a-4:30p 11/21/2016 9:08 AM

## 2016-11-21 NOTE — Progress Notes (Signed)
[facility] at [place] NAME: Spencer Acosta    MR#:  [id number]  DATE OF BIRTH:  [birth year]  SUBJECTIVE:   Still having some wheezing/bronchospasm.  Hematuria, Blood in stool has stopped.  Hg. Stable.   REVIEW OF SYSTEMS:    Review of Systems  Constitutional: Negative for chills and fever.  HENT: Negative for congestion and tinnitus.   Eyes: Negative for blurred vision and double vision.  Respiratory: Positive for cough and shortness of breath. Negative for wheezing.   Cardiovascular: Negative for chest pain, orthopnea and PND.  Gastrointestinal: Negative for abdominal pain, blood in stool, diarrhea, nausea and vomiting.  Genitourinary: Negative for dysuria and hematuria.  Neurological: Positive for weakness. Negative for dizziness, sensory change and focal weakness.  All other systems reviewed and are negative.   Nutrition: Soft diet Tolerating Diet: Yes Tolerating PT:  Eval. Noted.   DRUG ALLERGIES:   Allergies  Allergen Reactions  . Sulfa Antibiotics Nausea And Vomiting    VITALS:  Blood pressure 138/78, pulse 79, temperature 98.2 F (36.8 C), temperature source Oral, resp. rate 18, height 5\' 8"  (1.727 m), weight 83.5 kg (184 lb), SpO2 97 %.  PHYSICAL EXAMINATION:   Physical Exam  GENERAL:  82 y.o.-year-old patient lying in the bed in mild resp. Distress.  EYES: Pupils equal, round, reactive to light and accommodation. No scleral icterus. Extraocular muscles intact.  HEENT: Head atraumatic, normocephalic. Oropharynx and nasopharynx clear.  NECK:  Supple, no jugular venous distention. No thyroid enlargement, no tenderness.  LUNGS:  Good A/E b/l, b/l wheezing, No rales, b/l rhonchi. No use of accessory muscles of respiration.  CARDIOVASCULAR: S1, S2 normal. No murmurs, rubs, or gallops.  ABDOMEN: Soft, nontender, nondistended. Bowel sounds present. No organomegaly or mass.  EXTREMITIES: No cyanosis, clubbing, + 1 edema R>L    NEUROLOGIC: Cranial nerves II through XII are intact. No focal Motor or sensory deficits b/l. Globally weak.    PSYCHIATRIC: The patient is alert and oriented x 3.  SKIN: No obvious rash, lesion, or ulcer.    LABORATORY PANEL:   CBC  Recent Labs Lab 11/20/16 0535  11/21/16 0832  WBC 17.9*  --   --   HGB 9.4*  < > 9.4*  HCT 28.1*  --   --   PLT 173  --   --   < > = values in this interval not displayed. ------------------------------------------------------------------------------------------------------------------  Chemistries   Recent Labs Lab 11/18/16 0832  11/21/16 0212  NA 142  < > 143  K 4.0  < > 4.3  CL 110  < > 113*  CO2 27  < > 23  GLUCOSE 164*  < > 227*  BUN 58*  < > 69*  CREATININE 1.16  < > 1.42*  CALCIUM 8.5*  < > 8.2*  AST 38  --   --   ALT 75*  --   --   ALKPHOS 47  --   --   BILITOT 0.9  --   --   < > = values in this interval not displayed. ------------------------------------------------------------------------------------------------------------------  Cardiac Enzymes  Recent Labs Lab 11/18/16 0832  TROPONINI 0.05*   ------------------------------------------------------------------------------------------------------------------  RADIOLOGY:  No results found.   ASSESSMENT AND PLAN:   82 year old male with past medical history of peripheral asked disease, hypertension, hyperlipidemia, previous history of GI bleed, diabetes, Crohn's disease, chronic stage III, chronic atrial fibrillation, recent diagnosis of a right lower extremity DVT who presents to the hospital due  to cough, shortness of breath and weakness.   1. Acute respiratory failure with hypoxia-secondary to pneumonia. -Continue IV Zosyn, IV Steroids, O2 supplementation. Slow to improve.    2. Pneumonia-patient noted to have chest x-ray and CT scan findings suggestive of right upper lobe and lower lobe infiltrates. - cont. IV Zosyn, follow cultures which are negative so far.   - Continue IV steroids and will likely taper in a day or so, cont. Duonebs, Pulmicort nebs.    3. Recent history of a right lower extremity DVT- No further hematuria, hematochezia this am.  - was on Xarelto and it was stopped due to bleeding. Started Eliquis today and watch for bleeding. If has bleeding then needs to discuss with daughter about possible IVC.       4. Leukocytosis-secondary to the pneumonia. Improving.  5. Recent diagnosis of follicular lymphoma-patient has not started treatment yet. -Patient is followed by Dr. Mike Gip.  Appreciate Oncology input and plan for treatment as outpatient.   6. History of gout-no acute attack. Continue allopurinol.  7. History of Crohn's disease-continue mesalamine. - pt. Having some heme + stools and could be due to pt. Being on Xarelto. Will switch to Eliquis and monitor.   8. Essential hypertension-continue metoprolol.  9. Glaucoma-continue latanoprost eyedrops.  Await PT reeval and possible d/c to SNF over weekend if doing well.   All the records are reviewed and case discussed with Care Management/Social Worker. Management plans discussed with the patient, family and they are in agreement.  CODE STATUS: DNR  DVT Prophylaxis: Xarelto  TOTAL TIME TAKING CARE OF THIS PATIENT: 35 minutes.   POSSIBLE D/C IN 2-3 DAYS, DEPENDING ON CLINICAL CONDITION.   Henreitta Leber M.D on 11/21/2016 at 1:31 PM  Between 7am to 6pm - Pager - [phone]  After 6pm go to [email or web address] - Proofreader  [facility] [place] [facility]  Office  [phone]  CC: Primary care physician; Kirk Ruths., MD

## 2016-11-22 LAB — GLUCOSE, CAPILLARY
GLUCOSE-CAPILLARY: 200 mg/dL — AB (ref 65–99)
Glucose-Capillary: 159 mg/dL — ABNORMAL HIGH (ref 65–99)
Glucose-Capillary: 216 mg/dL — ABNORMAL HIGH (ref 65–99)
Glucose-Capillary: 241 mg/dL — ABNORMAL HIGH (ref 65–99)

## 2016-11-22 LAB — URINE CULTURE: Culture: >=100000 — AB

## 2016-11-22 LAB — CBC
HEMATOCRIT: 25 % — AB (ref 40.0–52.0)
HEMOGLOBIN: 8.6 g/dL — AB (ref 13.0–18.0)
MCH: 33.1 pg (ref 26.0–34.0)
MCHC: 34.5 g/dL (ref 32.0–36.0)
MCV: 96.1 fL (ref 80.0–100.0)
Platelets: 143 10*3/uL — ABNORMAL LOW (ref 150–440)
RBC: 2.61 MIL/uL — AB (ref 4.40–5.90)
RDW: 15.5 % — ABNORMAL HIGH (ref 11.5–14.5)
WBC: 10.4 10*3/uL (ref 3.8–10.6)

## 2016-11-22 LAB — BASIC METABOLIC PANEL
ANION GAP: 5 (ref 5–15)
BUN: 67 mg/dL — ABNORMAL HIGH (ref 6–20)
CO2: 25 mmol/L (ref 22–32)
Calcium: 8.2 mg/dL — ABNORMAL LOW (ref 8.9–10.3)
Chloride: 116 mmol/L — ABNORMAL HIGH (ref 101–111)
Creatinine, Ser: 1.32 mg/dL — ABNORMAL HIGH (ref 0.61–1.24)
GFR calc non Af Amer: 43 mL/min — ABNORMAL LOW (ref >60)
GFR, EST AFRICAN AMERICAN: 50 mL/min — AB (ref >60)
GLUCOSE: 197 mg/dL — AB (ref 65–99)
POTASSIUM: 3.8 mmol/L (ref 3.5–5.1)
Sodium: 146 mmol/L — ABNORMAL HIGH (ref 135–145)

## 2016-11-22 MED ORDER — SODIUM CHLORIDE 0.9 % IV SOLN
INTRAVENOUS | Status: DC
  Administered 2016-11-22: 14:00:00 via INTRAVENOUS

## 2016-11-22 MED ORDER — NYSTATIN 100000 UNIT/ML MT SUSP
5.0000 mL | Freq: Four times a day (QID) | OROMUCOSAL | Status: DC
  Administered 2016-11-22 – 2016-11-26 (×16): 500000 [IU] via ORAL
  Filled 2016-11-22 (×16): qty 5

## 2016-11-22 MED ORDER — ENOXAPARIN SODIUM 100 MG/ML ~~LOC~~ SOLN
1.0000 mg/kg | Freq: Two times a day (BID) | SUBCUTANEOUS | Status: DC
  Administered 2016-11-22 – 2016-11-23 (×2): 85 mg via SUBCUTANEOUS
  Filled 2016-11-22 (×3): qty 1

## 2016-11-22 NOTE — Progress Notes (Signed)
ANTICOAGULATION CONSULT NOTE - Initial Consult  Pharmacy Consult for enoxaparin Indication: VTE treatment  Allergies  Allergen Reactions  . Sulfa Antibiotics Nausea And Vomiting    Patient Measurements: Height: 5\' 8"  (172.7 cm) Weight: 184 lb (83.5 kg) IBW/kg (Calculated) : 68.4   Vital Signs: Temp: 98.2 F (36.8 C) (12/23 1342) Temp Source: Oral (12/23 1342) BP: 111/68 (12/23 1342) Pulse Rate: 76 (12/23 1342)  Labs:  Recent Labs  11/20/16 0535  11/21/16 0212 11/21/16 0832 11/22/16 0512  HGB 9.4*  < > 9.1* 9.4* 8.6*  HCT 28.1*  --   --   --  25.0*  PLT 173  --   --   --  143*  CREATININE 1.42*  --  1.42*  --  1.32*  < > = values in this interval not displayed.  Estimated Creatinine Clearance: 32.1 mL/min (by C-G formula based on SCr of 1.32 mg/dL (H)).   Medical History: Past Medical History:  Diagnosis Date  . A-fib ([place])   . CKD (chronic kidney disease)   . Crohn's disease ([place])   . Diabetes mellitus without complication ([place])   . Dysrhythmia   . GI bleed   . Hypertension   . Hypertriglyceridemia 03/11/2014  . Lymphoma ([place])   . PVD (peripheral vascular disease) [facility])      Assessment: 82 yo male on apixaban for recently found DVT switching to enoxaparin. Per notes pt with recent hematuria and hematochezia. Vascular recommending Lovenox for now as patient has not had any further bleeding other than the 1 episode. Spoke with pt's RN today who stated no s/sx of bleeding (including from urine or stool) in the last 24 hours.    Goal of Therapy:  Anti-Xa level 0.6-1 units/ml 4hrs after LMWH dose given  Monitor platelets by anticoagulation protocol: Yes   Plan:  Will order enoxaparin 1 mg/kg q12h (CrCl >30 ml/min) Will order SCr and CBC for AM to continue to monitor renal function (as it is borderline) and Hgb/Plt.  Pharmacy will continue to follow.   Rocky Morel 11/22/2016,2:21 PM

## 2016-11-22 NOTE — Consult Note (Signed)
Reason for Consult:Right CFV DVT, recent bleeding on anticoagulation Referring Physician: Dr. Talmage Coin is an 82 y.o. male.  HPI: History of follicular lymphoma with significant bulky lymphadenopathy of the retroperitoneum and right groin. Found to have a right CFV DVT in early December. Started on Xarelto did well initially. Readmitted for pneumonia. Noted to have an episode of hematuria and hematochezia. No further bleeding since then. Concern that he will have a new bleed. HgB trending down. Request for IVC filter.  Past Medical History:  Diagnosis Date  . A-fib ([place])   . CKD (chronic kidney disease)   . Crohn's disease ([place])   . Diabetes mellitus without complication ([place])   . Dysrhythmia   . GI bleed   . Hypertension   . Hypertriglyceridemia 03/11/2014  . Lymphoma ([place])   . PVD (peripheral vascular disease) ([place])     Past Surgical History:  Procedure Laterality Date  . APPENDECTOMY    . TURP VAPORIZATION      Family History  Problem Relation Age of Onset  . Diabetes Other   . Diabetes Mother     Social History:  reports that he quit smoking about 69 years ago. He has never used smokeless tobacco. He reports that he does not drink alcohol or use drugs.  Allergies:  Allergies  Allergen Reactions  . Sulfa Antibiotics Nausea And Vomiting    Medications: I have reviewed the patient's current medications.  Results for orders placed or performed during the hospital encounter of 11/18/16 (from the past 48 hour(s))  Hemoglobin     Status: Abnormal   Collection Time: 11/20/16  3:23 PM  Result Value Ref Range   Hemoglobin 10.4 (L) 13.0 - 18.0 g/dL  Glucose, capillary     Status: Abnormal   Collection Time: 11/20/16  4:56 PM  Result Value Ref Range   Glucose-Capillary 184 (H) 65 - 99 mg/dL  Hemoglobin     Status: Abnormal   Collection Time: 11/20/16  8:12 PM  Result Value Ref Range   Hemoglobin 9.8 (L) 13.0 - 18.0 g/dL  Glucose, capillary     Status:  Abnormal   Collection Time: 11/20/16  9:14 PM  Result Value Ref Range   Glucose-Capillary 264 (H) 65 - 99 mg/dL  Hemoglobin     Status: Abnormal   Collection Time: 11/21/16  2:12 AM  Result Value Ref Range   Hemoglobin 9.1 (L) 13.0 - 18.0 g/dL  Basic metabolic panel     Status: Abnormal   Collection Time: 11/21/16  2:12 AM  Result Value Ref Range   Sodium 143 135 - 145 mmol/L   Potassium 4.3 3.5 - 5.1 mmol/L   Chloride 113 (H) 101 - 111 mmol/L   CO2 23 22 - 32 mmol/L   Glucose, Bld 227 (H) 65 - 99 mg/dL   BUN 69 (H) 6 - 20 mg/dL   Creatinine, Ser 1.42 (H) 0.61 - 1.24 mg/dL   Calcium 8.2 (L) 8.9 - 10.3 mg/dL   GFR calc non Af Amer 39 (L) >60 mL/min   GFR calc Af Amer 45 (L) >60 mL/min    Comment: (NOTE) The eGFR has been calculated using the CKD EPI equation. This calculation has not been validated in all clinical situations. eGFR's persistently <60 mL/min signify possible Chronic Kidney Disease.    Anion gap 7 5 - 15  Glucose, capillary     Status: Abnormal   Collection Time: 11/21/16  7:22 AM  Result Value Ref Range  Glucose-Capillary 177 (H) 65 - 99 mg/dL  Hemoglobin     Status: Abnormal   Collection Time: 11/21/16  8:32 AM  Result Value Ref Range   Hemoglobin 9.4 (L) 13.0 - 18.0 g/dL  Glucose, capillary     Status: Abnormal   Collection Time: 11/21/16 11:53 AM  Result Value Ref Range   Glucose-Capillary 160 (H) 65 - 99 mg/dL  Glucose, capillary     Status: Abnormal   Collection Time: 11/21/16  4:58 PM  Result Value Ref Range   Glucose-Capillary 241 (H) 65 - 99 mg/dL  Glucose, capillary     Status: Abnormal   Collection Time: 11/21/16  8:46 PM  Result Value Ref Range   Glucose-Capillary 297 (H) 65 - 99 mg/dL  CBC     Status: Abnormal   Collection Time: 11/22/16  5:12 AM  Result Value Ref Range   WBC 10.4 3.8 - 10.6 K/uL   RBC 2.61 (L) 4.40 - 5.90 MIL/uL   Hemoglobin 8.6 (L) 13.0 - 18.0 g/dL   HCT 25.0 (L) 40.0 - 52.0 %   MCV 96.1 80.0 - 100.0 fL   MCH 33.1  26.0 - 34.0 pg   MCHC 34.5 32.0 - 36.0 g/dL   RDW 15.5 (H) 11.5 - 14.5 %   Platelets 143 (L) 150 - 440 K/uL  Basic metabolic panel     Status: Abnormal   Collection Time: 11/22/16  5:12 AM  Result Value Ref Range   Sodium 146 (H) 135 - 145 mmol/L   Potassium 3.8 3.5 - 5.1 mmol/L   Chloride 116 (H) 101 - 111 mmol/L   CO2 25 22 - 32 mmol/L   Glucose, Bld 197 (H) 65 - 99 mg/dL   BUN 67 (H) 6 - 20 mg/dL   Creatinine, Ser 1.32 (H) 0.61 - 1.24 mg/dL   Calcium 8.2 (L) 8.9 - 10.3 mg/dL   GFR calc non Af Amer 43 (L) >60 mL/min   GFR calc Af Amer 50 (L) >60 mL/min    Comment: (NOTE) The eGFR has been calculated using the CKD EPI equation. This calculation has not been validated in all clinical situations. eGFR's persistently <60 mL/min signify possible Chronic Kidney Disease.    Anion gap 5 5 - 15  Glucose, capillary     Status: Abnormal   Collection Time: 11/22/16  7:33 AM  Result Value Ref Range   Glucose-Capillary 159 (H) 65 - 99 mg/dL  Glucose, capillary     Status: Abnormal   Collection Time: 11/22/16 11:30 AM  Result Value Ref Range   Glucose-Capillary 200 (H) 65 - 99 mg/dL    No results found.  Review of Systems  Constitutional: Positive for malaise/fatigue.  HENT: Negative.   Eyes: Negative.   Respiratory: Positive for cough and shortness of breath.   Cardiovascular: Positive for leg swelling. Negative for chest pain and orthopnea.  Genitourinary: Negative.   Musculoskeletal: Negative.   Skin: Negative.   Neurological: Negative.   Endo/Heme/Allergies: Negative.    Blood pressure 111/68, pulse 76, temperature 98.2 F (36.8 C), temperature source Oral, resp. rate 20, height _0  (1.727 m), weight 83.5 kg (184 lb), SpO2 96 %. Physical Exam  Constitutional: He is oriented to person, place, and time. He appears well-developed and well-nourished.  HENT:  Head: Normocephalic.  Eyes: Pupils are equal, round, and reactive to light.  Neck: Normal range of motion. Neck  supple. No JVD present.  Cardiovascular: Normal rate, regular rhythm and intact distal pulses.  Respiratory: He has wheezes. He has rales.  GI: Soft.  Musculoskeletal: He exhibits edema.  1+ right lower extremity  Neurological: He is alert and oriented to person, place, and time.  Skin: Skin is warm.    Assessment/Plan: Right CFV DVT on Eliquis with an episode of hematuria and hematochezia. Patient 82yo. With lymphoma and bulky retroperitoneal and groin lymphadenopathy.   Recommend trying Lovenox for now patient has not had any further bleeding other than the 1 episode. He has tolerated Heparin gtt and had a history of hematuria prior to starting anticoagulation secondary to an enterococcus UTI.  If the patient remains stable- HgB wise without evidence of further bleeding would resume anticoagulation. Otherwise will proceed with an IVC filter in the next few days. Gentle hydration with Cr at ~1.5  Discussed with patient and daughter.  Shawniece Oyola A 11/22/2016, 1:44 PM

## 2016-11-22 NOTE — Progress Notes (Signed)
Patient ID: MONDARIUS LANSON, male   DOB: 05/25/1915, 82 y.o.   MRN: [id number]  [employer] PROGRESS NOTE  SEATON OAKLEY [id number] DOB: May 17, 1915 DOA: 11/18/2016 PCP: Kirk Ruths., MD  HPI/Subjective: Patient thanked me for taking care of him. Complains of some cough and shortness of breath. 2 days ago had a major bleed. No blood seen but hemoglobin drifted down.  Objective: Vitals:   11/22/16 0421 11/22/16 1342  BP: 126/77 111/68  Pulse: 94 76  Resp: 20   Temp: 98 F (36.7 C) 98.2 F (36.8 C)    Filed Weights   11/18/16 0811  Weight: 83.5 kg (184 lb)    ROS: Review of Systems  Constitutional: Negative for chills and fever.  Eyes: Negative for blurred vision.  Respiratory: Positive for cough, shortness of breath and wheezing.   Cardiovascular: Negative for chest pain.  Gastrointestinal: Positive for diarrhea. Negative for abdominal pain, constipation, nausea and vomiting.  Genitourinary: Negative for dysuria.  Musculoskeletal: Negative for joint pain.  Neurological: Negative for dizziness and headaches.   Exam: Physical Exam  Constitutional: He is oriented to person, place, and time.  HENT:  Nose: No mucosal edema.  Mouth/Throat: No oropharyngeal exudate or posterior oropharyngeal edema.  Eyes: Conjunctivae, EOM and lids are normal. Pupils are equal, round, and reactive to light.  Neck: No JVD present. Carotid bruit is not present. No edema present. No thyroid mass and no thyromegaly present.  Cardiovascular: S1 normal and S2 normal.  Exam reveals no gallop.   No murmur heard. Pulses:      Dorsalis pedis pulses are 2+ on the right side, and 2+ on the left side.  Respiratory: No respiratory distress. He has decreased breath sounds in the right upper field, the right middle field, the right lower field, the left upper field, the left middle field and the left lower field. He has wheezes in the left upper field. He has rhonchi in the right middle field, the  right lower field, the left middle field and the left lower field. He has no rales.  GI: Soft. Bowel sounds are normal. There is no tenderness.  Musculoskeletal:       Right ankle: He exhibits no swelling.       Left ankle: He exhibits no swelling.  Lymphadenopathy:    He has no cervical adenopathy.  Neurological: He is alert and oriented to person, place, and time. No cranial nerve deficit.  Skin: Skin is warm. No rash noted. Nails show no clubbing.  Psychiatric: He has a normal mood and affect.      Data Reviewed: Basic Metabolic Panel:  Recent Labs Lab 11/18/16 0832 11/19/16 0505 11/20/16 0535 11/21/16 0212 11/22/16 0512  NA 142 143 145 143 146*  K 4.0 4.3 4.2 4.3 3.8  CL 110 111 113* 113* 116*  CO2 27 27 26 23 25   GLUCOSE 164* 191* 147* 227* 197*  BUN 58* 61* 75* 69* 67*  CREATININE 1.16 1.35* 1.42* 1.42* 1.32*  CALCIUM 8.5* 8.4* 8.3* 8.2* 8.2*   Liver Function Tests:  Recent Labs Lab 11/17/16 0821 11/18/16 0832  AST 34 38  ALT 69* 75*  ALKPHOS 47 47  BILITOT 0.8 0.9  PROT 6.2* 6.2*  ALBUMIN 3.5 3.3*   CBC:  Recent Labs Lab 11/17/16 0821 11/18/16 0832 11/19/16 0505 11/20/16 0535  11/20/16 1523 11/20/16 2012 11/21/16 0212 11/21/16 0832 11/22/16 0512  WBC 15.1* 17.8* 14.9* 17.9*  --   --   --   --   --  10.4  NEUTROABS 12.5* 15.2*  --   --   --   --   --   --   --   --   HGB 11.3* 11.7* 10.3* 9.4*  < > 10.4* 9.8* 9.1* 9.4* 8.6*  HCT 33.5* 35.0* 30.9* 28.1*  --   --   --   --   --  25.0*  MCV 94.8 96.8 95.2 94.7  --   --   --   --   --  96.1  PLT 237 235 186 173  --   --   --   --   --  143*  < > = values in this interval not displayed. Cardiac Enzymes:  Recent Labs Lab 11/18/16 0832  TROPONINI 0.05*    CBG:  Recent Labs Lab 11/21/16 1153 11/21/16 1658 11/21/16 2046 11/22/16 0733 11/22/16 1130  GLUCAP 160* 241* 297* 159* 200*    Recent Results (from the past 240 hour(s))  Culture, blood (Routine x 2)     Status: None (Preliminary  result)   Collection Time: 11/18/16  8:32 AM  Result Value Ref Range Status   Specimen Description BLOOD RIGHT FOREARM  Final   Special Requests   Final    BOTTLES DRAWN AEROBIC AND ANAEROBIC AER 10ML ANA 11ML   Culture NO GROWTH 4 DAYS  Final   Report Status PENDING  Incomplete  Culture, blood (Routine x 2)     Status: None (Preliminary result)   Collection Time: 11/18/16  8:32 AM  Result Value Ref Range Status   Specimen Description BLOOD LEFT AC  Final   Special Requests   Final    BOTTLES DRAWN AEROBIC AND ANAEROBIC AER 10ML ANA 13ML   Culture NO GROWTH 4 DAYS  Final   Report Status PENDING  Incomplete  Urine culture     Status: Abnormal   Collection Time: 11/18/16  9:54 AM  Result Value Ref Range Status   Specimen Description URINE, RANDOM  Final   Special Requests NONE  Final   Culture >=100,000 COLONIES/mL ENTEROCOCCUS FAECALIS (A)  Final   Report Status 11/22/2016 FINAL  Final   Organism ID, Bacteria ENTEROCOCCUS FAECALIS (A)  Final      Susceptibility   Enterococcus faecalis - MIC*    AMPICILLIN <=2 SENSITIVE Sensitive     LEVOFLOXACIN 0.5 SENSITIVE Sensitive     NITROFURANTOIN <=16 SENSITIVE Sensitive     VANCOMYCIN 4 SENSITIVE Sensitive     * >=100,000 COLONIES/mL ENTEROCOCCUS FAECALIS      Scheduled Meds: . acidophilus  1 capsule Oral BID  . allopurinol  300 mg Oral Daily  . budesonide (PULMICORT) nebulizer solution  0.5 mg Nebulization BID  . cholecalciferol  1,000 Units Oral Daily  . enoxaparin (LOVENOX) injection  1 mg/kg Subcutaneous Q12H  . insulin aspart  0-15 Units Subcutaneous TID WC  . ipratropium-albuterol  3 mL Nebulization Q6H  . latanoprost  1 drop Both Eyes QHS  . Mesalamine  800 mg Oral TID  . methylPREDNISolone (SOLU-MEDROL) injection  40 mg Intravenous Q24H  . metoprolol tartrate  25 mg Oral BID  . multivitamin-lutein  1 capsule Oral Daily  . nystatin  5 mL Oral QID  . piperacillin-tazobactam (ZOSYN)  IV  3.375 g Intravenous Q8H    Continuous Infusions: . sodium chloride 30 mL/hr at 11/22/16 1410    Assessment/Plan:  1. Acute hypoxic respiratory failure. Continue oxygen supplementation 2. Right upper lobe and lower lobe infiltrates. Patient on IV Zosyn, nebulizers and  IV steroids for bronchospasm 3. Acute hemorrhagic anemia with blood in the stool and hematuria. The bleeding has stopped but his hemoglobin has come down to 8.6 and was as high as 11.7. Careful with anticoagulation. I stopped eliquis and switched to Lovenox because it shorter acting. 4. DVT right lower extremity. Spoke with vascular surgery about IVC filter placement 5. Chronic kidney disease stage III. Gentle IV fluid hydration prior to IVC filter placement 6. Follicular lymphoma. Follow-up with cancer Center as outpatient 7. UTI with enterococcus. Physician should cover  Code Status:     Code Status Orders        Start     Ordered   11/18/16 1139  Do not attempt resuscitation (DNR)  Continuous    Question Answer Comment  In the event of cardiac or respiratory ARREST Do not call a "code blue"   In the event of cardiac or respiratory ARREST Do not perform Intubation, CPR, defibrillation or ACLS   In the event of cardiac or respiratory ARREST Use medication by any route, position, wound care, and other measures to relive pain and suffering. May use oxygen, suction and manual treatment of airway obstruction as needed for comfort.      11/18/16 1138    Code Status History    Date Active Date Inactive Code Status Order ID Comments User Context   11/04/2016 11:26 PM 11/06/2016  2:47 PM DNR [id number]  Lance Coon, MD Inpatient   10/04/2016  8:29 PM 10/05/2016 12:55 AM DNR [id number]  Hinda Kehr, MD ED   02/19/2016  9:28 PM 02/22/2016  5:43 PM DNR [id number]  Gladstone Lighter, MD Inpatient    Advance Directive Documentation   Flowsheet Row Most Recent Value  Type of Advance Directive  Healthcare Power of Elaine (daughter) and Florencio Peele (son)]  Pre-existing out of facility DNR order (yellow form or pink MOST form)  No data  "MOST" Form in Place?  No data     Family Communication: Spoke with daughter at the bedside Disposition Plan: Patient will end up going to [facility] skilled nursing facility once breathing better  Consultants:  Vascular surgery  Oncology  Antibiotics:  Zosyn  Time spent: 28 minutes  [place], [place]

## 2016-11-23 LAB — CULTURE, BLOOD (ROUTINE X 2)
CULTURE: NO GROWTH
Culture: NO GROWTH

## 2016-11-23 LAB — CBC
HEMATOCRIT: 25 % — AB (ref 40.0–52.0)
Hemoglobin: 8.4 g/dL — ABNORMAL LOW (ref 13.0–18.0)
MCH: 32.6 pg (ref 26.0–34.0)
MCHC: 33.7 g/dL (ref 32.0–36.0)
MCV: 96.7 fL (ref 80.0–100.0)
Platelets: 138 10*3/uL — ABNORMAL LOW (ref 150–440)
RBC: 2.58 MIL/uL — ABNORMAL LOW (ref 4.40–5.90)
RDW: 15.6 % — AB (ref 11.5–14.5)
WBC: 10.1 10*3/uL (ref 3.8–10.6)

## 2016-11-23 LAB — BASIC METABOLIC PANEL
Anion gap: 4 — ABNORMAL LOW (ref 5–15)
BUN: 61 mg/dL — ABNORMAL HIGH (ref 6–20)
CO2: 26 mmol/L (ref 22–32)
CREATININE: 1.41 mg/dL — AB (ref 0.61–1.24)
Calcium: 8.3 mg/dL — ABNORMAL LOW (ref 8.9–10.3)
Chloride: 117 mmol/L — ABNORMAL HIGH (ref 101–111)
GFR calc non Af Amer: 40 mL/min — ABNORMAL LOW (ref >60)
GFR, EST AFRICAN AMERICAN: 46 mL/min — AB (ref >60)
GLUCOSE: 169 mg/dL — AB (ref 65–99)
Potassium: 4.2 mmol/L (ref 3.5–5.1)
Sodium: 147 mmol/L — ABNORMAL HIGH (ref 135–145)

## 2016-11-23 LAB — GLUCOSE, CAPILLARY
Glucose-Capillary: 156 mg/dL — ABNORMAL HIGH (ref 65–99)
Glucose-Capillary: 146 mg/dL — ABNORMAL HIGH (ref 65–99)
Glucose-Capillary: 240 mg/dL — ABNORMAL HIGH (ref 65–99)
Glucose-Capillary: 218 mg/dL — ABNORMAL HIGH (ref 65–99)

## 2016-11-23 MED ORDER — APIXABAN 5 MG PO TABS
5.0000 mg | ORAL_TABLET | Freq: Two times a day (BID) | ORAL | Status: DC
  Administered 2016-11-23 – 2016-11-25 (×4): 5 mg via ORAL
  Filled 2016-11-23 (×4): qty 1

## 2016-11-23 MED ORDER — ALLOPURINOL 100 MG PO TABS
100.0000 mg | ORAL_TABLET | Freq: Every day | ORAL | Status: DC
Start: 2016-11-23 — End: 2016-11-26
  Administered 2016-11-23 – 2016-11-26 (×4): 100 mg via ORAL
  Filled 2016-11-23 (×4): qty 1

## 2016-11-23 NOTE — Progress Notes (Signed)
Patient ID: Spencer Acosta, male   DOB: [birth year], 82 y.o.   MRN: [id number]  [facility] PROGRESS NOTE  Spencer Acosta [id number] DOB: 10-18-1915 DOA: 11/18/2016 PCP: Kirk Ruths., MD  HPI/Subjective: Patient again thanked me for my care. He feels okay. Coughing a little bit less but still coughing.  Objective: Vitals:   11/23/16 1006 11/23/16 1006  BP: 123/67 123/67  Pulse: 96 96  Resp:    Temp:  98.1 F (36.7 C)    Filed Weights   11/18/16 0811  Weight: 83.5 kg (184 lb)    ROS: Review of Systems  Constitutional: Negative for chills and fever.  Eyes: Negative for blurred vision.  Respiratory: Positive for cough, shortness of breath and wheezing.   Cardiovascular: Negative for chest pain.  Gastrointestinal: Negative for abdominal pain, constipation, diarrhea, nausea and vomiting.  Genitourinary: Negative for dysuria.  Musculoskeletal: Negative for joint pain.  Neurological: Negative for dizziness and headaches.   Exam: Physical Exam  Constitutional: He is oriented to person, place, and time.  HENT:  Nose: No mucosal edema.  Mouth/Throat: No oropharyngeal exudate or posterior oropharyngeal edema.  Eyes: Conjunctivae, EOM and lids are normal. Pupils are equal, round, and reactive to light.  Neck: No JVD present. Carotid bruit is not present. No edema present. No thyroid mass and no thyromegaly present.  Cardiovascular: S1 normal and S2 normal.  Exam reveals no gallop.   No murmur heard. Pulses:      Dorsalis pedis pulses are 2+ on the right side, and 2+ on the left side.  Respiratory: No respiratory distress. He has decreased breath sounds in the right middle field, the right lower field, the left middle field and the left lower field. He has wheezes. He has rhonchi in the right middle field, the right lower field, the left middle field and the left lower field. He has no rales.  GI: Soft. Bowel sounds are normal. There is no tenderness.  Musculoskeletal:        Right ankle: He exhibits no swelling.       Left ankle: He exhibits no swelling.  Lymphadenopathy:    He has no cervical adenopathy.  Neurological: He is alert and oriented to person, place, and time. No cranial nerve deficit.  Skin: Skin is warm. No rash noted. Nails show no clubbing.  Psychiatric: He has a normal mood and affect.      Data Reviewed: Basic Metabolic Panel:  Recent Labs Lab 11/19/16 0505 11/20/16 0535 11/21/16 0212 11/22/16 0512 11/23/16 0446  NA 143 145 143 146* 147*  K 4.3 4.2 4.3 3.8 4.2  CL 111 113* 113* 116* 117*  CO2 27 26 23 25 26   GLUCOSE 191* 147* 227* 197* 169*  BUN 61* 75* 69* 67* 61*  CREATININE 1.35* 1.42* 1.42* 1.32* 1.41*  CALCIUM 8.4* 8.3* 8.2* 8.2* 8.3*   Liver Function Tests:  Recent Labs Lab 11/17/16 0821 11/18/16 0832  AST 34 38  ALT 69* 75*  ALKPHOS 47 47  BILITOT 0.8 0.9  PROT 6.2* 6.2*  ALBUMIN 3.5 3.3*   CBC:  Recent Labs Lab 11/17/16 0821 11/18/16 0832 11/19/16 0505 11/20/16 0535  11/20/16 2012 11/21/16 0212 11/21/16 0832 11/22/16 0512 11/23/16 0446  WBC 15.1* 17.8* 14.9* 17.9*  --   --   --   --  10.4 10.1  NEUTROABS 12.5* 15.2*  --   --   --   --   --   --   --   --  HGB 11.3* 11.7* 10.3* 9.4*  < > 9.8* 9.1* 9.4* 8.6* 8.4*  HCT 33.5* 35.0* 30.9* 28.1*  --   --   --   --  25.0* 25.0*  MCV 94.8 96.8 95.2 94.7  --   --   --   --  96.1 96.7  PLT 237 235 186 173  --   --   --   --  143* 138*  < > = values in this interval not displayed. Cardiac Enzymes:  Recent Labs Lab 11/18/16 0832  TROPONINI 0.05*    CBG:  Recent Labs Lab 11/22/16 1130 11/22/16 1645 11/22/16 2040 11/23/16 0734 11/23/16 1200  GLUCAP 200* 216* 241* 156* 146*    Recent Results (from the past 240 hour(s))  Culture, blood (Routine x 2)     Status: None   Collection Time: 11/18/16  8:32 AM  Result Value Ref Range Status   Specimen Description BLOOD RIGHT FOREARM  Final   Special Requests   Final    BOTTLES DRAWN  AEROBIC AND ANAEROBIC AER 10ML ANA 11ML   Culture NO GROWTH 5 DAYS  Final   Report Status 11/23/2016 FINAL  Final  Culture, blood (Routine x 2)     Status: None   Collection Time: 11/18/16  8:32 AM  Result Value Ref Range Status   Specimen Description BLOOD LEFT AC  Final   Special Requests   Final    BOTTLES DRAWN AEROBIC AND ANAEROBIC AER 10ML ANA 13ML   Culture NO GROWTH 5 DAYS  Final   Report Status 11/23/2016 FINAL  Final  Urine culture     Status: Abnormal   Collection Time: 11/18/16  9:54 AM  Result Value Ref Range Status   Specimen Description URINE, RANDOM  Final   Special Requests NONE  Final   Culture >=100,000 COLONIES/mL ENTEROCOCCUS FAECALIS (A)  Final   Report Status 11/22/2016 FINAL  Final   Organism ID, Bacteria ENTEROCOCCUS FAECALIS (A)  Final      Susceptibility   Enterococcus faecalis - MIC*    AMPICILLIN <=2 SENSITIVE Sensitive     LEVOFLOXACIN 0.5 SENSITIVE Sensitive     NITROFURANTOIN <=16 SENSITIVE Sensitive     VANCOMYCIN 4 SENSITIVE Sensitive     * >=100,000 COLONIES/mL ENTEROCOCCUS FAECALIS      Scheduled Meds: . acidophilus  1 capsule Oral BID  . allopurinol  100 mg Oral Daily  . budesonide (PULMICORT) nebulizer solution  0.5 mg Nebulization BID  . cholecalciferol  1,000 Units Oral Daily  . insulin aspart  0-15 Units Subcutaneous TID WC  . ipratropium-albuterol  3 mL Nebulization Q6H  . latanoprost  1 drop Both Eyes QHS  . Mesalamine  800 mg Oral TID  . methylPREDNISolone (SOLU-MEDROL) injection  40 mg Intravenous Q24H  . metoprolol tartrate  25 mg Oral BID  . multivitamin-lutein  1 capsule Oral Daily  . nystatin  5 mL Oral QID  . piperacillin-tazobactam (ZOSYN)  IV  3.375 g Intravenous Q8H    Assessment/Plan:  1. Acute hypoxic respiratory failure. Continue oxygen supplementation 2. Right upper lobe and lower lobe infiltrates. Patient on IV Zosyn, nebulizers and IV steroids for bronchospasm. Still requiring steroids at this point. 3. Acute  hemorrhagic anemia with blood in the stool and hematuria. The bleeding has stopped but his hemoglobin has come down to 8.4 and was as high as 11.7.  I spoke with vascular surgery and pharmacy about restarting the eliquis at lower dose. 4. DVT right lower extremity. Spoke  with vascular surgery. Depending on how he does with anticoagulation and his hemoglobin will depend on whether or not IV filter is placed on Tuesday or not. 5. Chronic kidney disease stage III. Stop IV fluids 6. Follicular lymphoma. Follow-up with cancer Center as outpatient 7. UTI with enterococcus. Zosyn should cover.  Code Status:     Code Status Orders        Start     Ordered   11/18/16 1139  Do not attempt resuscitation (DNR)  Continuous    Question Answer Comment  In the event of cardiac or respiratory ARREST Do not call a "code blue"   In the event of cardiac or respiratory ARREST Do not perform Intubation, CPR, defibrillation or ACLS   In the event of cardiac or respiratory ARREST Use medication by any route, position, wound care, and other measures to relive pain and suffering. May use oxygen, suction and manual treatment of airway obstruction as needed for comfort.      11/18/16 1138    Code Status History    Date Active Date Inactive Code Status Order ID Comments User Context   11/04/2016 11:26 PM 11/06/2016  2:47 PM DNR [id number]  Lance Coon, MD Inpatient   10/04/2016  8:29 PM 10/05/2016 12:55 AM DNR [id number]  Hinda Kehr, MD ED   02/19/2016  9:28 PM 02/22/2016  5:43 PM DNR [id number]  Gladstone Lighter, MD Inpatient    Advance Directive Documentation   Flowsheet Row Most Recent Value  Type of Advance Directive  Healthcare Power of [place] (daughter) and Mikaiah Calahan (son)]  Pre-existing out of facility DNR order (yellow form or pink MOST form)  No data  "MOST" Form in Place?  No data     Family Communication: Spoke with daughter at the bedside Disposition Plan: Patient will end up going to  [facility] skilled nursing facility once breathing better  Consultants:  Vascular surgery  Oncology  Antibiotics:  Zosyn  Time spent: 25 minutes  [place], [place]

## 2016-11-23 NOTE — Progress Notes (Signed)
ANTICOAGULATION CONSULT NOTE - follow up [place] for enoxaparin Indication: VTE treatment  Allergies  Allergen Reactions  . Sulfa Antibiotics Nausea And Vomiting    Patient Measurements: Height: 5\' 8"  (172.7 cm) Weight: 184 lb (83.5 kg) IBW/kg (Calculated) : 68.4   Vital Signs: Temp: 98.1 F (36.7 C) (12/24 0415) Temp Source: Oral (12/24 0415) BP: 158/86 (12/24 0415) Pulse Rate: 90 (12/24 0415)  Labs:  Recent Labs  11/21/16 0212  11/22/16 0512 11/23/16 0446  HGB 9.1*  < > 8.6* 8.4*  HCT  --   --  25.0* 25.0*  PLT  --   --  143* 138*  CREATININE 1.42*  --  1.32* 1.41*  < > = values in this interval not displayed.  Estimated Creatinine Clearance: 30 mL/min (by C-G formula based on SCr of 1.41 mg/dL (H)).   Medical History: Past Medical History:  Diagnosis Date  . A-fib ([place])   . CKD (chronic kidney disease)   . Crohn's disease ([place])   . Diabetes mellitus without complication ([place])   . Dysrhythmia   . GI bleed   . Hypertension   . Hypertriglyceridemia 03/11/2014  . Lymphoma ([place])   . PVD (peripheral vascular disease) [facility])      Assessment: 82 yo male on apixaban for recently found DVT switching to enoxaparin. Per notes pt with recent hematuria and hematochezia. Vascular recommending Lovenox for now as patient has not had any further bleeding other than the 1 episode. Spoke with pt's RN today who stated no s/sx of bleeding (including from urine or stool) in the last 24 hours.    Goal of Therapy:  Monitor platelets by anticoagulation protocol: Yes   Plan:  Will order enoxaparin 1 mg/kg (85 mg) q12h (CrCl >30 ml/min) Will order SCr and CBC for AM to continue to monitor renal function (as it is borderline) and Hgb/Plt.  12/24:  Scr 1.41, Crcl = 30 ml/min. Will continue current Lovenox dosing. F/u renal fxn as patient is borderline for dose decrease.  Pharmacy will continue to follow.   Layloni Fahrner A 11/23/2016,8:41 AM

## 2016-11-23 NOTE — Progress Notes (Signed)
ANTICOAGULATION CONSULT NOTE - Initial Consult  Pharmacy Consult for apixaban Indication: DVT  Allergies  Allergen Reactions  . Sulfa Antibiotics Nausea And Vomiting    Patient Measurements: Height: 5\' 8"  (172.7 cm) Weight: 184 lb (83.5 kg) IBW/kg (Calculated) : 68.4   Vital Signs: Temp: 98.1 F (36.7 C) (12/24 1006) Temp Source: Oral (12/24 1006) BP: 123/67 (12/24 1006) Pulse Rate: 96 (12/24 1006)  Labs:  Recent Labs  11/21/16 0212  11/22/16 0512 11/23/16 0446  HGB 9.1*  < > 8.6* 8.4*  HCT  --   --  25.0* 25.0*  PLT  --   --  143* 138*  CREATININE 1.42*  --  1.32* 1.41*  < > = values in this interval not displayed.  Estimated Creatinine Clearance: 30 mL/min (by C-G formula based on SCr of 1.41 mg/dL (H)).    Assessment: 82 yo male with recent DVT with bleed on Xarelto starting on apixaban, switched to enoxaparin yesterday, and switching back to apixaban today. Per discussion with Dr. Leslye Peer, Hgb still trending down and advanced age, will start pt on lower dose of apixaban. Last enoxaparin dose charted as 0500 this AM. Per RN, no s/sx of bleeding noted so far and none reported overnight from previous shift.    Goal of Therapy:  Monitor platelets by anticoagulation protocol: Yes   Plan:  Per discussion with MD will start apixaban 5 mg PO BID at this time starting at 1700. Discussed plan with RN.  CBC and SCr every three days per policy. Will order CBC for AM to follow trend in pt with recent bleed.   Spencer Acosta 11/23/2016,1:41 PM

## 2016-11-23 NOTE — Progress Notes (Signed)
  Subjective: Doing OK. Without complaint. Per nursing no further bleeding on Lovenox.  Objective: Vital signs in last 24 hours: Temp:  [97.7 F (36.5 C)-98.2 F (36.8 C)] 98.1 F (36.7 C) (12/24 1006) Pulse Rate:  [76-99] 96 (12/24 1006) Resp:  [18-19] 18 (12/24 0415) BP: (111-158)/(67-86) 123/67 (12/24 1006) SpO2:  [95 %-99 %] 97 % (12/24 1006) Last BM Date: 11/22/16  Intake/Output from previous day: 12/23 0701 - 12/24 0700 In: 1523 [P.O.:600; I.V.:473; IV Piggyback:450] Out: 525 [Urine:525] Intake/Output this shift: No intake/output data recorded.  General appearance: alert, cooperative and no distress Resp: rhonchi bilaterally Cardio: regular rate and rhythm, S1, S2 normal, no murmur, click, rub or gallop Extremities: edema right lower extremity 1+  Lab Results:   Recent Labs  11/22/16 0512 11/23/16 0446  WBC 10.4 10.1  HGB 8.6* 8.4*  HCT 25.0* 25.0*  PLT 143* 138*   BMET  Recent Labs  11/22/16 0512 11/23/16 0446  NA 146* 147*  K 3.8 4.2  CL 116* 117*  CO2 25 26  GLUCOSE 197* 169*  BUN 67* 61*  CREATININE 1.32* 1.41*  CALCIUM 8.2* 8.3*   PT/INR No results for input(s): LABPROT, INR in the last 72 hours. ABG No results for input(s): PHART, HCO3 in the last 72 hours.  Invalid input(s): PCO2, PO2  Studies/Results: No results found.  Anti-infectives: Anti-infectives    Start     Dose/Rate Route Frequency Ordered Stop   11/18/16 2000  vancomycin (VANCOCIN) IVPB 1000 mg/200 mL premix  Status:  Discontinued     1,000 mg 200 mL/hr over 60 Minutes Intravenous Every 24 hours 11/18/16 1201 11/18/16 1231   11/18/16 1400  piperacillin-tazobactam (ZOSYN) IVPB 3.375 g     3.375 g 12.5 mL/hr over 240 Minutes Intravenous Every 8 hours 11/18/16 1201     11/18/16 1300  vancomycin (VANCOCIN) IVPB 1000 mg/200 mL premix  Status:  Discontinued     1,000 mg 200 mL/hr over 60 Minutes Intravenous  Once 11/18/16 1201 11/18/16 1231   11/18/16 0915   piperacillin-tazobactam (ZOSYN) IVPB 3.375 g     3.375 g 100 mL/hr over 30 Minutes Intravenous  Once 11/18/16 0909 11/18/16 0957      Assessment/Plan:  Patient with right follicular bulky lymphoma, CFV DVT previously on Xarelto developed a bleeding episode, HgB trending down over the last 1 month, now resolved without further bleeding, HgB stable on Lovenox.  Recommend trial of Eliquis today. If any evidence of bleeding will proceed with IVC filter on Tuesday. Discussed with Patient and Daughter.   LOS: 5 days    Evaristo Bury 11/23/2016

## 2016-11-24 ENCOUNTER — Inpatient Hospital Stay: Payer: Medicare Other

## 2016-11-24 DIAGNOSIS — I701 Atherosclerosis of renal artery: Secondary | ICD-10-CM

## 2016-11-24 DIAGNOSIS — K922 Gastrointestinal hemorrhage, unspecified: Secondary | ICD-10-CM

## 2016-11-24 LAB — BASIC METABOLIC PANEL
Anion gap: 4 — ABNORMAL LOW (ref 5–15)
BUN: 56 mg/dL — AB (ref 6–20)
CALCIUM: 8.5 mg/dL — AB (ref 8.9–10.3)
CHLORIDE: 116 mmol/L — AB (ref 101–111)
CO2: 26 mmol/L (ref 22–32)
Creatinine, Ser: 1.35 mg/dL — ABNORMAL HIGH (ref 0.61–1.24)
GFR, EST AFRICAN AMERICAN: 48 mL/min — AB (ref >60)
GFR, EST NON AFRICAN AMERICAN: 42 mL/min — AB (ref >60)
GLUCOSE: 133 mg/dL — AB (ref 65–99)
POTASSIUM: 3.6 mmol/L (ref 3.5–5.1)
SODIUM: 146 mmol/L — AB (ref 135–145)

## 2016-11-24 LAB — GLUCOSE, CAPILLARY
GLUCOSE-CAPILLARY: 119 mg/dL — AB (ref 65–99)
GLUCOSE-CAPILLARY: 160 mg/dL — AB (ref 65–99)
Glucose-Capillary: 231 mg/dL — ABNORMAL HIGH (ref 65–99)
Glucose-Capillary: 130 mg/dL — ABNORMAL HIGH (ref 65–99)

## 2016-11-24 LAB — CBC
HEMATOCRIT: 24.8 % — AB (ref 40.0–52.0)
Hemoglobin: 8.3 g/dL — ABNORMAL LOW (ref 13.0–18.0)
MCH: 32 pg (ref 26.0–34.0)
MCHC: 33.3 g/dL (ref 32.0–36.0)
MCV: 96.1 fL (ref 80.0–100.0)
Platelets: 138 10*3/uL — ABNORMAL LOW (ref 150–440)
RBC: 2.58 MIL/uL — ABNORMAL LOW (ref 4.40–5.90)
RDW: 15.3 % — AB (ref 11.5–14.5)
WBC: 11.1 10*3/uL — ABNORMAL HIGH (ref 3.8–10.6)

## 2016-11-24 LAB — OCCULT BLOOD X 1 CARD TO LAB, STOOL: Fecal Occult Bld: POSITIVE — AB

## 2016-11-24 MED ORDER — IPRATROPIUM-ALBUTEROL 0.5-2.5 (3) MG/3ML IN SOLN
3.0000 mL | Freq: Three times a day (TID) | RESPIRATORY_TRACT | Status: DC
  Administered 2016-11-25 – 2016-11-26 (×4): 3 mL via RESPIRATORY_TRACT
  Filled 2016-11-24 (×3): qty 3

## 2016-11-24 MED ORDER — LEVOFLOXACIN 750 MG PO TABS
750.0000 mg | ORAL_TABLET | ORAL | Status: DC
  Administered 2016-11-24 – 2016-11-26 (×2): 750 mg via ORAL
  Filled 2016-11-24 (×2): qty 1

## 2016-11-24 NOTE — Progress Notes (Signed)
CBG 119; not flowing to lab results

## 2016-11-24 NOTE — Progress Notes (Signed)
Pharmacy Antibiotic Note  Spencer Acosta is a 82 y.o. male admitted on 11/18/2016 with pneumonia.  Pharmacy has been consulted for Levaquin dosing.  Plan: After discussion with Dr. Leslye Peer, antibiotics will be changed to Levaquin 750mg  PO q48h.  Height: 5\' 8"  (172.7 cm) Weight: 184 lb (83.5 kg) IBW/kg (Calculated) : 68.4  Temp (24hrs), Avg:98.1 F (36.7 C), Min:97.5 F (36.4 C), Max:98.7 F (37.1 C)   Recent Labs Lab 11/18/16 0832 11/18/16 1220 11/19/16 0505 11/20/16 0535 11/21/16 0212 11/22/16 0512 11/23/16 0446 11/24/16 0420  WBC 17.8*  --  14.9* 17.9*  --  10.4 10.1 11.1*  CREATININE 1.16  --  1.35* 1.42* 1.42* 1.32* 1.41* 1.35*  LATICACIDVEN 2.0* 1.3  --   --   --   --   --   --     Estimated Creatinine Clearance: 31.4 mL/min (by C-G formula based on SCr of 1.35 mg/dL (H)).    Allergies  Allergen Reactions  . Sulfa Antibiotics Nausea And Vomiting    Antimicrobials this admission: Zosyn 12/19 >> 12/25 Levaquin 12/25 >>   Dose adjustments this admission:   Thank you for allowing pharmacy to be a part of this patient's care.  Paulina Fusi, PharmD, BCPS 11/24/2016 11:22 AM

## 2016-11-24 NOTE — Progress Notes (Signed)
Dr. Leslye Peer notified occult stool positive. Verbal order to continue Eliquis as ordered as benefit outweighs risk currently. Hemoglobin to be drawn in the morning. Will report to night RN to closely monitor for bleeding.  Patient's daughter at bedside updated, understands, and agrees with current plan of care.

## 2016-11-24 NOTE — Progress Notes (Signed)
Patient's daughter, Clare Gandy, at bedside felt her father has seemed lethargic and sleepy since 11am. Patient easily aroused, VSS, incontinent episode x2 cleaned up, new foam placed on bottom for protection. Patient now sitting up eating a muffin and drinking water.

## 2016-11-24 NOTE — Progress Notes (Signed)
Another dark, large BM. Unable to tell if contains blood or not. Verbal order for Occult stool sent to lab. Will notify Dr. Leslye Peer of results to determine if 1800 Eliquis dose should be given or not.

## 2016-11-24 NOTE — Progress Notes (Signed)
Patient ID: Spencer Acosta, male   DOB: [birth year], 82 y.o.   MRN: [id number]  [employer] PROGRESS NOTE  Spencer Acosta [id number] DOB: 11-12-1915 DOA: 11/18/2016 PCP: Kirk Ruths., MD  HPI/Subjective: Patient feels okay. Some cough but states he is breathing okay.  Objective: Vitals:   11/24/16 0400 11/24/16 0840  BP: 139/88 (!) 138/56  Pulse: 86 80  Resp:  17  Temp: 97.8 F (36.6 C) 98.5 F (36.9 C)    Filed Weights   11/18/16 0811  Weight: 83.5 kg (184 lb)    ROS: Review of Systems  Constitutional: Negative for chills and fever.  Eyes: Negative for blurred vision.  Respiratory: Positive for cough and shortness of breath. Negative for wheezing.   Cardiovascular: Negative for chest pain.  Gastrointestinal: Negative for abdominal pain, constipation, diarrhea, nausea and vomiting.  Genitourinary: Negative for dysuria.  Musculoskeletal: Negative for joint pain.  Neurological: Negative for dizziness and headaches.   Exam: Physical Exam  Constitutional: He is oriented to person, place, and time.  HENT:  Nose: No mucosal edema.  Mouth/Throat: No oropharyngeal exudate or posterior oropharyngeal edema.  Eyes: Conjunctivae, EOM and lids are normal. Pupils are equal, round, and reactive to light.  Neck: No JVD present. Carotid bruit is not present. No edema present. No thyroid mass and no thyromegaly present.  Cardiovascular: S1 normal and S2 normal.  Exam reveals no gallop.   No murmur heard. Pulses:      Dorsalis pedis pulses are 2+ on the right side, and 2+ on the left side.  Respiratory: No respiratory distress. He has decreased breath sounds in the right middle field, the right lower field, the left middle field and the left lower field. He has wheezes in the right middle field, the left middle field and the left lower field. He has rhonchi in the right lower field and the left lower field. He has no rales.  GI: Soft. Bowel sounds are normal. There is no  tenderness.  Musculoskeletal:       Right ankle: He exhibits no swelling.       Left ankle: He exhibits no swelling.  Lymphadenopathy:    He has no cervical adenopathy.  Neurological: He is alert and oriented to person, place, and time. No cranial nerve deficit.  Skin: Skin is warm. No rash noted. Nails show no clubbing.  Psychiatric: He has a normal mood and affect.      Data Reviewed: Basic Metabolic Panel:  Recent Labs Lab 11/20/16 0535 11/21/16 0212 11/22/16 0512 11/23/16 0446 11/24/16 0420  NA 145 143 146* 147* 146*  K 4.2 4.3 3.8 4.2 3.6  CL 113* 113* 116* 117* 116*  CO2 26 23 25 26 26   GLUCOSE 147* 227* 197* 169* 133*  BUN 75* 69* 67* 61* 56*  CREATININE 1.42* 1.42* 1.32* 1.41* 1.35*  CALCIUM 8.3* 8.2* 8.2* 8.3* 8.5*   Liver Function Tests:  Recent Labs Lab 11/18/16 0832  AST 38  ALT 75*  ALKPHOS 47  BILITOT 0.9  PROT 6.2*  ALBUMIN 3.3*   CBC:  Recent Labs Lab 11/18/16 0832 11/19/16 0505 11/20/16 0535  11/21/16 0212 11/21/16 0832 11/22/16 0512 11/23/16 0446 11/24/16 0420  WBC 17.8* 14.9* 17.9*  --   --   --  10.4 10.1 11.1*  NEUTROABS 15.2*  --   --   --   --   --   --   --   --   HGB 11.7* 10.3* 9.4*  < > 9.1*  9.4* 8.6* 8.4* 8.3*  HCT 35.0* 30.9* 28.1*  --   --   --  25.0* 25.0* 24.8*  MCV 96.8 95.2 94.7  --   --   --  96.1 96.7 96.1  PLT 235 186 173  --   --   --  143* 138* 138*  < > = values in this interval not displayed. Cardiac Enzymes:  Recent Labs Lab 11/18/16 0832  TROPONINI 0.05*    CBG:  Recent Labs Lab 11/22/16 2040 11/23/16 0734 11/23/16 1200 11/23/16 1647 11/23/16 2036  GLUCAP 241* 156* 146* 240* 218*    Recent Results (from the past 240 hour(s))  Culture, blood (Routine x 2)     Status: None   Collection Time: 11/18/16  8:32 AM  Result Value Ref Range Status   Specimen Description BLOOD RIGHT FOREARM  Final   Special Requests   Final    BOTTLES DRAWN AEROBIC AND ANAEROBIC AER 10ML ANA 11ML   Culture NO  GROWTH 5 DAYS  Final   Report Status 11/23/2016 FINAL  Final  Culture, blood (Routine x 2)     Status: None   Collection Time: 11/18/16  8:32 AM  Result Value Ref Range Status   Specimen Description BLOOD LEFT AC  Final   Special Requests   Final    BOTTLES DRAWN AEROBIC AND ANAEROBIC AER 10ML ANA 13ML   Culture NO GROWTH 5 DAYS  Final   Report Status 11/23/2016 FINAL  Final  Urine culture     Status: Abnormal   Collection Time: 11/18/16  9:54 AM  Result Value Ref Range Status   Specimen Description URINE, RANDOM  Final   Special Requests NONE  Final   Culture >=100,000 COLONIES/mL ENTEROCOCCUS FAECALIS (A)  Final   Report Status 11/22/2016 FINAL  Final   Organism ID, Bacteria ENTEROCOCCUS FAECALIS (A)  Final      Susceptibility   Enterococcus faecalis - MIC*    AMPICILLIN <=2 SENSITIVE Sensitive     LEVOFLOXACIN 0.5 SENSITIVE Sensitive     NITROFURANTOIN <=16 SENSITIVE Sensitive     VANCOMYCIN 4 SENSITIVE Sensitive     * >=100,000 COLONIES/mL ENTEROCOCCUS FAECALIS      Scheduled Meds: . acidophilus  1 capsule Oral BID  . allopurinol  100 mg Oral Daily  . apixaban  5 mg Oral BID  . budesonide (PULMICORT) nebulizer solution  0.5 mg Nebulization BID  . cholecalciferol  1,000 Units Oral Daily  . insulin aspart  0-15 Units Subcutaneous TID WC  . ipratropium-albuterol  3 mL Nebulization Q6H  . latanoprost  1 drop Both Eyes QHS  . Mesalamine  800 mg Oral TID  . methylPREDNISolone (SOLU-MEDROL) injection  40 mg Intravenous Q24H  . metoprolol tartrate  25 mg Oral BID  . multivitamin-lutein  1 capsule Oral Daily  . nystatin  5 mL Oral QID    Assessment/Plan:  1. Acute hypoxic respiratory failure. Improved and now breathing on room air 2. Right upper lobe and lower lobe infiltrates. Switch antibiotics today from Zosyn over to oral Levaquin. Continue nebulizers and IV steroids for bronchospasm. Still requiring steroids at this point. 3. Acute hemorrhagic anemia with blood in the  stool and hematuria. The bleeding has stopped but his hemoglobin has come down to 8.3 and was as high as 11.7.  Continue eliquis at lower dose. 4. DVT right lower extremity. Spoke with vascular surgery. Depending on how he does with anticoagulation and his hemoglobin will depend on whether or not IVC  filter is placed on Tuesday or not. 5. Chronic kidney disease stage III.  6. Follicular lymphoma. Follow-up with cancer Center as outpatient 7. UTI with enterococcus. Levaquin will cover  Code Status:     Code Status Orders        Start     Ordered   11/18/16 1139  Do not attempt resuscitation (DNR)  Continuous    Question Answer Comment  In the event of cardiac or respiratory ARREST Do not call a "code blue"   In the event of cardiac or respiratory ARREST Do not perform Intubation, CPR, defibrillation or ACLS   In the event of cardiac or respiratory ARREST Use medication by any route, position, wound care, and other measures to relive pain and suffering. May use oxygen, suction and manual treatment of airway obstruction as needed for comfort.      11/18/16 1138    Code Status History    Date Active Date Inactive Code Status Order ID Comments User Context   11/04/2016 11:26 PM 11/06/2016  2:47 PM DNR [id number]  Lance Coon, MD Inpatient   10/04/2016  8:29 PM 10/05/2016 12:55 AM DNR [id number]  Hinda Kehr, MD ED   02/19/2016  9:28 PM 02/22/2016  5:43 PM DNR [id number]  Gladstone Lighter, MD Inpatient    Advance Directive Documentation   Flowsheet Row Most Recent Value  Type of Advance Directive  Healthcare Power of [place] (daughter) and Cormac Feliz (son)]  Pre-existing out of facility DNR order (yellow form or pink MOST form)  No data  "MOST" Form in Place?  No data     Family Communication: Spoke with daughter Wilburn Mylar Disposition Plan: Patient will end up going to [facility] skilled nursing facility once breathing better  Consultants:  Vascular  surgery  Oncology  Antibiotics:  Oral Levaquin  Time spent: 22 minutes  [place], [place]

## 2016-11-24 NOTE — Progress Notes (Signed)
[place]  [phone] [phone]) [phone]  ID: Princess Bruins OB: 10/08/1915  MR#: [id number]  [id number]  Patient Care Team: Kirk Ruths, MD as PCP - General (Internal Medicine) Cleon Gustin, MD as Consulting Physician (Urology)  CHIEF COMPLAINT: Shortness of breath, pneumonia, bulky adenopathy secondary to follicular lymphoma, GI bleed  INTERVAL HISTORY: Patient feels well today. He is alert with daughter at the bedside. No further evidence of GI bleeding.  REVIEW OF SYSTEMS:   Review of Systems  Constitutional: Negative.  Negative for fever, malaise/fatigue and weight loss.  Respiratory: Positive for shortness of breath.   Cardiovascular: Negative.  Negative for chest pain and leg swelling.  Gastrointestinal: Positive for blood in stool. Negative for abdominal pain.  Genitourinary: Negative.   Musculoskeletal: Negative.   Neurological: Negative.   Psychiatric/Behavioral: Positive for memory loss.    As per HPI. Otherwise, a complete review of systems is negative.  PAST MEDICAL HISTORY: Past Medical History:  Diagnosis Date  . A-fib ([place])   . CKD (chronic kidney disease)   . Crohn's disease ([place])   . Diabetes mellitus without complication ([place])   . Dysrhythmia   . GI bleed   . Hypertension   . Hypertriglyceridemia 03/11/2014  . Lymphoma ([place])   . PVD (peripheral vascular disease) ([place])     PAST SURGICAL HISTORY: Past Surgical History:  Procedure Laterality Date  . APPENDECTOMY    . TURP VAPORIZATION      FAMILY HISTORY: Family History  Problem Relation Age of Onset  . Diabetes Other   . Diabetes Mother     ADVANCED DIRECTIVES (Y/N):  @ADVDIR @  HEALTH MAINTENANCE: Social History  Substance Use Topics  . Smoking status: Former Smoker    Quit date: 10/22/1947  . Smokeless tobacco: Never Used     Comment: quit 70 years ago  . Alcohol use No     Comment: occasional     Colonoscopy:  PAP:  Bone  density:  Lipid panel:  Allergies  Allergen Reactions  . Sulfa Antibiotics Nausea And Vomiting    Current Facility-Administered Medications  Medication Dose Route Frequency Provider Last Rate Last Dose  . acetaminophen (TYLENOL) tablet 650 mg  650 mg Oral Q6H PRN Henreitta Leber, MD       Or  . acetaminophen (TYLENOL) suppository 650 mg  650 mg Rectal Q6H PRN Henreitta Leber, MD      . acidophilus (RISAQUAD) capsule 1 capsule  1 capsule Oral BID Henreitta Leber, MD   1 capsule at 11/24/16 0842  . allopurinol (ZYLOPRIM) tablet 100 mg  100 mg Oral Daily Loletha Grayer, MD   100 mg at 11/24/16 0842  . apixaban (ELIQUIS) tablet 5 mg  5 mg Oral BID Loletha Grayer, MD   5 mg at 11/24/16 0507  . budesonide (PULMICORT) nebulizer solution 0.5 mg  0.5 mg Nebulization BID Henreitta Leber, MD   0.5 mg at 11/24/16 0731  . cholecalciferol (VITAMIN D) tablet 1,000 Units  1,000 Units Oral Daily Henreitta Leber, MD   1,000 Units at 11/24/16 [phone]  . guaiFENesin-dextromethorphan (ROBITUSSIN DM) 100-10 MG/5ML syrup 5 mL  5 mL Oral Q4H PRN Henreitta Leber, MD   5 mL at 11/22/16 0022  . insulin aspart (novoLOG) injection 0-15 Units  0-15 Units Subcutaneous TID WC Henreitta Leber, MD   3 Units at 11/24/16 1240  . ipratropium-albuterol (DUONEB) 0.5-2.5 (3) MG/3ML nebulizer solution 3 mL  3 mL Nebulization Q6H Vivek  Gaetano Net, MD   3 mL at 11/24/16 1312  . latanoprost (XALATAN) 0.005 % ophthalmic solution 1 drop  1 drop Both Eyes QHS Henreitta Leber, MD   1 drop at 11/23/16 2235  . levofloxacin (LEVAQUIN) tablet 750 mg  750 mg Oral Q48H Loletha Grayer, MD   750 mg at 11/24/16 1254  . Mesalamine (ASACOL) DR capsule 800 mg  800 mg Oral TID Henreitta Leber, MD   800 mg at 11/24/16 0842  . methylPREDNISolone sodium succinate (SOLU-MEDROL) 40 mg/mL injection 40 mg  40 mg Intravenous Q24H Henreitta Leber, MD   40 mg at 11/24/16 0842  . metoprolol tartrate (LOPRESSOR) tablet 25 mg  25 mg Oral BID Henreitta Leber, MD    25 mg at 11/24/16 0842  . multivitamin-lutein (OCUVITE-LUTEIN) capsule 1 capsule  1 capsule Oral Daily Henreitta Leber, MD   1 capsule at 11/24/16 [phone]  . nystatin (MYCOSTATIN) 100000 UNIT/ML suspension 500,000 Units  5 mL Oral QID Loletha Grayer, MD   500,000 Units at 11/24/16 1254  . ondansetron (ZOFRAN) tablet 4 mg  4 mg Oral Q6H PRN Henreitta Leber, MD       Or  . ondansetron (ZOFRAN) injection 4 mg  4 mg Intravenous Q6H PRN Henreitta Leber, MD        OBJECTIVE: Vitals:   11/24/16 0840 11/24/16 1253  BP: (!) 138/56 130/61  Pulse: 80 88  Resp: 17 20  Temp: 98.5 F (36.9 C) 98.1 F (36.7 C)     Body mass index is 27.98 kg/m.    ECOG FS:2 - Symptomatic, <50% confined to bed  General: Well-developed, well-nourished, no acute distress. Eyes: Pink conjunctiva, anicteric sclera. Lungs: Clear to auscultation bilaterally. Heart: Regular rate and rhythm. No rubs, murmurs, or gallops. Abdomen: Soft, nontender, nondistended. No organomegaly noted, normoactive bowel sounds. Musculoskeletal: No edema, cyanosis, or clubbing. Neuro: Alert. Cranial nerves grossly intact. Skin: No rashes or petechiae noted. Psych: Normal affect.   LAB RESULTS:  Lab Results  Component Value Date   NA 146 (H) 11/24/2016   K 3.6 11/24/2016   CL 116 (H) 11/24/2016   CO2 26 11/24/2016   GLUCOSE 133 (H) 11/24/2016   BUN 56 (H) 11/24/2016   CREATININE 1.35 (H) 11/24/2016   CALCIUM 8.5 (L) 11/24/2016   PROT 6.2 (L) 11/18/2016   ALBUMIN 3.3 (L) 11/18/2016   AST 38 11/18/2016   ALT 75 (H) 11/18/2016   ALKPHOS 47 11/18/2016   BILITOT 0.9 11/18/2016   GFRNONAA 42 (L) 11/24/2016   GFRAA 48 (L) 11/24/2016    Lab Results  Component Value Date   WBC 11.1 (H) 11/24/2016   NEUTROABS 15.2 (H) 11/18/2016   HGB 8.3 (L) 11/24/2016   HCT 24.8 (L) 11/24/2016   MCV 96.1 11/24/2016   PLT 138 (L) 11/24/2016     STUDIES: Dg Chest 1 View  Result Date: 11/24/2016 CLINICAL DATA:  Cough, shortness of breath,  and wheezing.  Lymphoma. EXAM: CHEST 1 VIEW COMPARISON:  11/18/2016 FINDINGS: The cardiac silhouette remains enlarged. Aortic atherosclerosis is noted. There is a persistent small right pleural effusion with right lower lobe airspace consolidation, similar to the prior study allowing for differences in patient positioning. Right upper lobe airspace consolidation has improved. Minimal left basilar opacity likely reflects atelectasis. No pneumothorax. IMPRESSION: 1. Unchanged right pleural effusion and right lower lobe consolidation compatible with pneumonia. Improved right upper lobe consolidation. 2. Aortic atherosclerosis. Electronically Signed   By: Seymour Bars.D.  On: 11/24/2016 09:36   Dg Chest 2 View  Result Date: 11/18/2016 CLINICAL DATA:  Cough. EXAM: CHEST  2 VIEW COMPARISON:  CT 11/14/2016.  Chest x-ray 03/17/2016. FINDINGS: Right upper lobe and right lower lobe pulmonary infiltrates consistent with pneumonia. Small right pleural effusion. Cardiomegaly with normal pulmonary vascularity . IMPRESSION: Prominent right upper lobe and right lower lobe infiltrates consistent with pneumonia. Small right pleural effusion. Electronically Signed   By: Marcello Moores  Register   On: 11/18/2016 09:05   Ct Chest Wo Contrast  Result Date: 11/14/2016 CLINICAL DATA:  History of lymphoma. EXAM: CT CHEST WITHOUT CONTRAST TECHNIQUE: Multidetector CT imaging of the chest was performed following the standard protocol without IV contrast. COMPARISON:  None. FINDINGS: Cardiovascular: Moderate to marked cardiac enlargement. Aortic atherosclerosis noted. Calcification in the LAD and RCA coronary artery noted. No pericardial effusion. Mediastinum/Nodes: No supraclavicular or axillary lymph nodes. No significant mediastinal or hilar adenopathy. Lungs/Pleura: Small to moderate right pleural effusion identified. Airspace consolidation within the anterior right lower lobe is identified, image 117 of series 3. Right middle lobe  subpleural nodule measures 4 mm, image 97 of series 3. Upper Abdomen: No acute abnormality identified. Stones identified within the gallbladder. There is abdominal aortic atherosclerosis. Retroperitoneal adenopathy as described on CT from 11/04/2016. Bilateral kidney cysts are present which are incompletely characterized without IV contrast. Musculoskeletal: No chest wall mass or suspicious bone lesions identified. IMPRESSION: 1. No significant thoracic adenopathy identified. 2. Cardiac enlargement, aortic atherosclerosis and multi vessel coronary artery calcification. 3. Persistent right pleural effusion and right lower lobe airspace consolidation. 4. Upper abdominal adenopathy as described on CT from 11/04/2016. Electronically Signed   By: Kerby Moors M.D.   On: 11/14/2016 15:46   Ct Angio Aortobifemoral W And/or Wo Contrast  Result Date: 11/04/2016 CLINICAL DATA:  Acute presentation with right leg redness and swelling beginning yesterday. Painful. EXAM: CT ANGIOGRAPHY AOBIFEM WITHOUT AND WITH CONTRAST<Procedure Description>CT ANGIOGRAPHY AOBIFEM WITHOUT AND WITH CONTRAST TECHNIQUE: CT angiography of the aorta and lower extremities. CONTRAST:  100 cc Isovue 370 COMPARISON:  10/04/2016 . FINDINGS: Right pleural effusion with right lower lobe atelectasis. Liver is normal except for 1 cm low-density in the right lobe likely to be benign. Present previously and unchanged. Multiple calcified gallstones. 1.5 cm low-density in the pancreatic body as seen previously. Spleen is normal. Adrenal glands are normal. Renal cysts as previously seen. Massive progression of retroperitoneal all lymphadenopathy and right inguinal and iliac adenopathy since the previous study. The differential diagnosis is lymphoma versus metastatic disease. This quite likely results and venous obstruction of the right lower extremity. No primary bowel pathology. Bladder is unremarkable. Prostate gland is small. Chronic degenerative changes  affect the spine. No destructive bone findings. There is diffuse aortic atherosclerosis. No aneurysm. Celiac origin shows 30% stenosis. Superior mesenteric artery origin is widely patent. No significant distal stenosis is seen. Bilateral renal artery atherosclerosis. Severe stenosis at the proximal right renal artery, possibly 80% or greater. 30% stenosis of the left renal artery. Inferior mesenteric artery is patent with 30% proximal stenosis. Diffuse iliac atherosclerosis without stenosis more than 25 or 30%. Both common femoral artery show atherosclerosis but no stenosis greater than approximately 30%. There is slower flow to the left leg than the right, but the femoral popliteal arteries in the lower leg runoff vessels are patent bilaterally to the foot. There is diffuse swelling in of the right lower extremity with edema probably secondary to venous obstruction. Review of the MIP images confirms the above  findings. IMPRESSION: Right pleural effusion with right lower lobe atelectasis. Massive retroperitoneal, right iliac and right inguinal lymphadenopathy, progressive since the previous study and resulting in venous obstruction of the right lower extremity. Swelling and subcutaneous edema of the right lower extremity secondary to venous obstruction. This could be lymphoma or metastatic disease, with lymphoma favored. Aortic atherosclerosis. Atherosclerosis of the branch vessels. Severe stenosis of the right renal artery, 80% or greater. Slower arterial flow to the left leg relative to the right, but without significant lower extremity arterial occlusion or compromise on either side. Chololithiasis. No change in 1.5 cm pancreatic low-density. Electronically Signed   By: Nelson Chimes M.D.   On: 11/04/2016 19:23   US Venous Img Lower Unilateral Right  Result Date: 11/04/2016 CLINICAL DATA:  Right lower extremity pain and swelling for 2 weeks. EXAM: Right LOWER EXTREMITY VENOUS DOPPLER ULTRASOUND TECHNIQUE:  Gray-scale sonography with graded compression, as well as color Doppler and duplex ultrasound were performed to evaluate the lower extremity deep venous systems from the level of the common femoral vein and including the common femoral, femoral, profunda femoral, popliteal and calf veins including the posterior tibial, peroneal and gastrocnemius veins when visible. The superficial great saphenous vein was also interrogated. Spectral Doppler was utilized to evaluate flow at rest and with distal augmentation maneuvers in the common femoral, femoral and popliteal veins. COMPARISON:  None. FINDINGS: Contralateral Common Femoral Vein: Respiratory phasicity is normal and symmetric with the symptomatic side. No evidence of thrombus. Normal compressibility. Common Femoral Vein: Nonocclusive thrombus is noted. Partial flow is noted. Saphenofemoral Junction: No evidence of thrombus. Normal compressibility and flow on color Doppler imaging. Profunda Femoral Vein: No evidence of thrombus. Normal flow on color Doppler imaging. Unable to compress due to large mass in right groin. Femoral Vein: No evidence of thrombus. Normal respiratory phasicity and response to augmentation. Unable to compress due to large mass in right groin. Popliteal Vein: No evidence of thrombus. Normal compressibility, respiratory phasicity and response to augmentation. Calf Veins: No evidence of thrombus. Normal compressibility and flow on color Doppler imaging. Superficial Great Saphenous Vein: No evidence of thrombus. Normal compressibility and flow on color Doppler imaging. Venous Reflux:  None. Other Findings: Multiple large right inguinal lymph nodes are noted, with the largest measuring 8.5 x 5.8 x 4.8 cm. This lymph node has been recently biopsied. IMPRESSION: Acute nonocclusive deep venous thrombosis seen in right common femoral vein. Large right inguinal lymph node or mass is noted which has been previously biopsied. Critical Value/emergent  results were called by telephone at the time of interpretation on 11/04/2016 at 7:46 pm to Dr. Lenise Arena , who verbally acknowledged these results. Electronically Signed   By: Marijo Conception, M.D.   On: 11/04/2016 19:43    ASSESSMENT: Shortness of breath, pneumonia, bulky adenopathy secondary to follicular lymphoma, GI bleed.  PLAN:    1. Follicular lymphoma: Patient was initially supposed to start treatment this week under the care of Dr. Mike Gip. We will continue to delay treatment until patient's acute symptoms resolve. Follow-up in the [facility] 1 week after discharge for further evaluation and discussion of initiating treatment. 2. GI bleed: Patient's hemoglobin slowly trending down from 8.6 - 8.3 over several days. Plan is to re-start a trial of Eliquis today. If any further evidence of GI bleed, will discontinue anticoagulation and place IVC filter. Appreciate vascular surgery input. 3. DVT: Eliquis and IVC filter as above.  4. Pneumonia: Agree with current antibiotics. 5. Leukocytosis: Likely  secondary to underlying infection, monitor.  Appreciate consult, will follow.  Lloyd Huger, MD   11/24/2016 2:14 PM

## 2016-11-24 NOTE — Progress Notes (Signed)
Dr. Leslye Peer notified large incontinent BM dark, faint red outline on pad. Last dose Eliquis was 0507am this morning. No changes at this time, will continue to closely monitor for any bleeding. Hemoglobin stable.   Daughter at bedside updated on current plan of care.

## 2016-11-25 ENCOUNTER — Inpatient Hospital Stay: Payer: Medicare Other

## 2016-11-25 ENCOUNTER — Inpatient Hospital Stay: Payer: Medicare Other | Admitting: Oncology

## 2016-11-25 ENCOUNTER — Encounter
Admission: EM | Disposition: A | Discharge status: Skilled Nursing Facility | Payer: Self-pay | Source: Home / Self Care | Attending: Internal Medicine

## 2016-11-25 DIAGNOSIS — I82409 Acute embolism and thrombosis of unspecified deep veins of unspecified lower extremity: Secondary | ICD-10-CM

## 2016-11-25 HISTORY — PX: PERIPHERAL VASCULAR CATHETERIZATION: SHX172C

## 2016-11-25 LAB — GLUCOSE, CAPILLARY
GLUCOSE-CAPILLARY: 149 mg/dL — AB (ref 65–99)
GLUCOSE-CAPILLARY: 173 mg/dL — AB (ref 65–99)
GLUCOSE-CAPILLARY: 181 mg/dL — AB (ref 65–99)
Glucose-Capillary: 107 mg/dL — ABNORMAL HIGH (ref 65–99)

## 2016-11-25 LAB — HEMOGLOBIN: Hemoglobin: 8 g/dL — ABNORMAL LOW (ref 13.0–18.0)

## 2016-11-25 SURGERY — IVC FILTER INSERTION
Anesthesia: LOCAL

## 2016-11-25 MED ORDER — MIDAZOLAM HCL 2 MG/2ML IJ SOLN
INTRAMUSCULAR | Status: AC
  Filled 2016-11-25: qty 2

## 2016-11-25 MED ORDER — HEPARIN (PORCINE) IN NACL 2-0.9 UNIT/ML-% IJ SOLN
INTRAMUSCULAR | Status: AC
  Filled 2016-11-25: qty 500

## 2016-11-25 MED ORDER — LIDOCAINE HCL (PF) 1 % IJ SOLN
INTRAMUSCULAR | Status: AC
  Filled 2016-11-25: qty 30

## 2016-11-25 MED ORDER — SODIUM CHLORIDE 0.9 % IV SOLN: INTRAVENOUS | Status: DC

## 2016-11-25 MED ORDER — MIDAZOLAM HCL 2 MG/2ML IJ SOLN
INTRAMUSCULAR | Status: DC | PRN
Start: 2016-11-25 — End: 2016-11-25
  Administered 2016-11-25: 1 mg via INTRAVENOUS

## 2016-11-25 SURGICAL SUPPLY — 3 items
FILTER VC CELECT-FEMORAL (Filter) ×2 IMPLANT
GUIDEWIRE J TIP .035 (WIRE) ×2 IMPLANT
PACK ANGIOGRAPHY (CUSTOM PROCEDURE TRAY) ×2 IMPLANT

## 2016-11-25 NOTE — Progress Notes (Signed)
Patient ID: Spencer Acosta, male   DOB: [birth year], 82 y.o.   MRN: [id number]  [facility] PROGRESS NOTE  Spencer Acosta [id number] DOB: 29-Apr-1915 DOA: 11/18/2016 PCP: Kirk Ruths., MD  HPI/Subjective: Patient had quite a few episodes of bowel movements yesterday with trace blood. Patient still will short of breath and some cough but better than yesterday. Patient again thanked me for my care.  Objective: Vitals:   11/25/16 0745 11/25/16 1446  BP: 140/72 114/62  Pulse: 84 88  Resp: 20 20  Temp:  98.1 F (36.7 C)    Filed Weights   11/18/16 0811  Weight: 83.5 kg (184 lb)    ROS: Review of Systems  Constitutional: Negative for chills and fever.  Eyes: Negative for blurred vision.  Respiratory: Positive for cough and shortness of breath. Negative for wheezing.   Cardiovascular: Negative for chest pain.  Gastrointestinal: Positive for diarrhea. Negative for abdominal pain, nausea and vomiting.  Genitourinary: Negative for dysuria.  Musculoskeletal: Negative for joint pain.  Neurological: Negative for dizziness and headaches.   Exam: Physical Exam  Constitutional: He is oriented to person, place, and time.  HENT:  Nose: No mucosal edema.  Mouth/Throat: No oropharyngeal exudate or posterior oropharyngeal edema.  Eyes: Conjunctivae, EOM and lids are normal. Pupils are equal, round, and reactive to light.  Neck: No JVD present. Carotid bruit is not present. No edema present. No thyroid mass and no thyromegaly present.  Cardiovascular: S1 normal and S2 normal.  Exam reveals no gallop.   No murmur heard. Pulses:      Dorsalis pedis pulses are 2+ on the right side, and 2+ on the left side.  Respiratory: No respiratory distress. He has decreased breath sounds in the right middle field, the right lower field, the left middle field and the left lower field. He has wheezes in the right middle field, the left middle field and the left lower field. He has rhonchi in the  right lower field and the left lower field. He has no rales.  GI: Soft. Bowel sounds are normal. There is no tenderness.  Musculoskeletal:       Right ankle: He exhibits no swelling.       Left ankle: He exhibits no swelling.  Lymphadenopathy:    He has no cervical adenopathy.  Neurological: He is alert and oriented to person, place, and time. No cranial nerve deficit.  Skin: Skin is warm. No rash noted. Nails show no clubbing.  Psychiatric: He has a normal mood and affect.      Data Reviewed: Basic Metabolic Panel:  Recent Labs Lab 11/20/16 0535 11/21/16 0212 11/22/16 0512 11/23/16 0446 11/24/16 0420  NA 145 143 146* 147* 146*  K 4.2 4.3 3.8 4.2 3.6  CL 113* 113* 116* 117* 116*  CO2 26 23 25 26 26   GLUCOSE 147* 227* 197* 169* 133*  BUN 75* 69* 67* 61* 56*  CREATININE 1.42* 1.42* 1.32* 1.41* 1.35*  CALCIUM 8.3* 8.2* 8.2* 8.3* 8.5*   CBC:  Recent Labs Lab 11/19/16 0505 11/20/16 0535  11/21/16 0832 11/22/16 0512 11/23/16 0446 11/24/16 0420 11/25/16 0431  WBC 14.9* 17.9*  --   --  10.4 10.1 11.1*  --   HGB 10.3* 9.4*  < > 9.4* 8.6* 8.4* 8.3* 8.0*  HCT 30.9* 28.1*  --   --  25.0* 25.0* 24.8*  --   MCV 95.2 94.7  --   --  96.1 96.7 96.1  --   PLT 186 173  --   --  143* 138* 138*  --   < > = values in this interval not displayed.  CBG:  Recent Labs Lab 11/24/16 1141 11/24/16 1659 11/24/16 2056 11/25/16 0739 11/25/16 1132  GLUCAP 160* 231* 130* 107* 149*    Recent Results (from the past 240 hour(s))  Culture, blood (Routine x 2)     Status: None   Collection Time: 11/18/16  8:32 AM  Result Value Ref Range Status   Specimen Description BLOOD RIGHT FOREARM  Final   Special Requests   Final    BOTTLES DRAWN AEROBIC AND ANAEROBIC AER 10ML ANA 11ML   Culture NO GROWTH 5 DAYS  Final   Report Status 11/23/2016 FINAL  Final  Culture, blood (Routine x 2)     Status: None   Collection Time: 11/18/16  8:32 AM  Result Value Ref Range Status   Specimen  Description BLOOD LEFT AC  Final   Special Requests   Final    BOTTLES DRAWN AEROBIC AND ANAEROBIC AER 10ML ANA 13ML   Culture NO GROWTH 5 DAYS  Final   Report Status 11/23/2016 FINAL  Final  Urine culture     Status: Abnormal   Collection Time: 11/18/16  9:54 AM  Result Value Ref Range Status   Specimen Description URINE, RANDOM  Final   Special Requests NONE  Final   Culture >=100,000 COLONIES/mL ENTEROCOCCUS FAECALIS (A)  Final   Report Status 11/22/2016 FINAL  Final   Organism ID, Bacteria ENTEROCOCCUS FAECALIS (A)  Final      Susceptibility   Enterococcus faecalis - MIC*    AMPICILLIN <=2 SENSITIVE Sensitive     LEVOFLOXACIN 0.5 SENSITIVE Sensitive     NITROFURANTOIN <=16 SENSITIVE Sensitive     VANCOMYCIN 4 SENSITIVE Sensitive     * >=100,000 COLONIES/mL ENTEROCOCCUS FAECALIS      Scheduled Meds: . acidophilus  1 capsule Oral BID  . allopurinol  100 mg Oral Daily  . budesonide (PULMICORT) nebulizer solution  0.5 mg Nebulization BID  . cholecalciferol  1,000 Units Oral Daily  . insulin aspart  0-15 Units Subcutaneous TID WC  . ipratropium-albuterol  3 mL Nebulization TID  . latanoprost  1 drop Both Eyes QHS  . levofloxacin  750 mg Oral Q48H  . Mesalamine  800 mg Oral TID  . methylPREDNISolone (SOLU-MEDROL) injection  40 mg Intravenous Q24H  . metoprolol tartrate  25 mg Oral BID  . multivitamin-lutein  1 capsule Oral Daily  . nystatin  5 mL Oral QID    Assessment/Plan:  1. Acute hypoxic respiratory failure. Improved and now breathing on room air 2. Right upper lobe and lower lobe infiltrates. Switched antibiotics to oral Levaquin. Continue nebulizers and IV steroids for bronchospasm.  3. Acute hemorrhagic anemia with blood in the stool and hematuria. The patient with trace blood yesterday and hemoglobin drifting down. Will repeat hemoglobin tomorrow with type and cross just in case transfusion needed. Stop blood thinners. 4. DVT right lower extremity. Spoke with  vascular surgery and IVC filter will be placed today and we will stop blood thinners. 5. Chronic kidney disease stage III.  6. Follicular lymphoma. Follow-up with cancer Center as outpatient 7. UTI with enterococcus. Levaquin will cover  Code Status:     Code Status Orders        Start     Ordered   11/18/16 1139  Do not attempt resuscitation (DNR)  Continuous    Question Answer Comment  In the event of cardiac or respiratory ARREST  Do not call a "code blue"   In the event of cardiac or respiratory ARREST Do not perform Intubation, CPR, defibrillation or ACLS   In the event of cardiac or respiratory ARREST Use medication by any route, position, wound care, and other measures to relive pain and suffering. May use oxygen, suction and manual treatment of airway obstruction as needed for comfort.      11/18/16 1138    Code Status History    Date Active Date Inactive Code Status Order ID Comments User Context   11/04/2016 11:26 PM 11/06/2016  2:47 PM DNR [id number]  Lance Coon, MD Inpatient   10/04/2016  8:29 PM 10/05/2016 12:55 AM DNR [id number]  Hinda Kehr, MD ED   02/19/2016  9:28 PM 02/22/2016  5:43 PM DNR [id number]  Gladstone Lighter, MD Inpatient    Advance Directive Documentation   Flowsheet Row Most Recent Value  Type of Advance Directive  Healthcare Power of [place] (daughter) and Damir Feezor (son)]  Pre-existing out of facility DNR order (yellow form or pink MOST form)  No data  "MOST" Form in Place?  No data     Family Communication: Spoke with daughter Today Disposition Plan: Patient will end up going to [facility] skilled nursing facility once breathing better  Consultants:  Vascular surgery  Oncology  Antibiotics:  Oral Levaquin  Time spent: 24 minutes  [place], [place]

## 2016-11-25 NOTE — Progress Notes (Signed)
[facility]  Daily Progress Note   Subjective  - * No surgery date entered *  Heme positive stools. Hgb down to 8.0. He has no other complaints  Objective Vitals:   11/24/16 1844 11/24/16 2031 11/25/16 0432 11/25/16 0745  BP:  130/76 137/80 140/72  Pulse:  (!) 105 82 84  Resp:  18 19 20   Temp:  98.4 F (36.9 C) 98.7 F (37.1 C)   TempSrc:  Oral Oral   SpO2: 96% 97% 98% 99%  Weight:      Height:        Intake/Output Summary (Last 24 hours) at 11/25/16 1428 Last data filed at 11/25/16 1139  Gross per 24 hour  Intake              240 ml  Output              325 ml  Net              -85 ml    PULM  CTAB CV  RRR VASC  Legs minimally swollen  Laboratory CBC    Component Value Date/Time   WBC 11.1 (H) 11/24/2016 0420   HGB 8.0 (L) 11/25/2016 0431   HCT 24.8 (L) 11/24/2016 0420   PLT 138 (L) 11/24/2016 0420    BMET    Component Value Date/Time   NA 146 (H) 11/24/2016 0420   K 3.6 11/24/2016 0420   CL 116 (H) 11/24/2016 0420   CO2 26 11/24/2016 0420   GLUCOSE 133 (H) 11/24/2016 0420   BUN 56 (H) 11/24/2016 0420   CREATININE 1.35 (H) 11/24/2016 0420   CALCIUM 8.5 (L) 11/24/2016 0420   GFRNONAA 42 (L) 11/24/2016 0420   GFRAA 48 (L) 11/24/2016 0420    Assessment/Planning:    DVT with GI bleed.  Hgb down to 8.0.  Heme positive stools. Discussed with patient and daughter.  They would now prefer IVC filter and stop anticoagulation.  Discussed thrombosis of IVC filter risk being pretty high, but they are agreeable to proceed    Leotis Pain  11/25/2016, 2:28 PM

## 2016-11-25 NOTE — Care Management Important Message (Signed)
Important Message  Patient Details  Name: Spencer Acosta MRN: [id number] Date of Birth: 06/15/1915   Medicare Important Message Given:  Yes    Shelbie Ammons, RN 11/25/2016, 8:08 AM

## 2016-11-25 NOTE — Op Note (Signed)
[facility]   OPERATIVE NOTE    PRE-OPERATIVE DIAGNOSIS: DVT with GI bleed and anemia  POST-OPERATIVE DIAGNOSIS: same as above  PROCEDURE: 1.   Ultrasound guidance for vascular access to the left femoral vein 2.   Catheter placement into the inferior vena cava 3.   Inferior venacavogram 4.   Placement of a Cook Celect IVC filter  SURGEON: Leotis Pain, MD  ASSISTANT(S): None  ANESTHESIA: local with Sedation for approximately 10 minutes using 1 mg of Versed   ESTIMATED BLOOD LOSS: minimal  CONTRAST: 15 cc  FLUORO TIME: less than one minute  FINDING(S): 1.  Patent IVC  SPECIMEN(S):  none  INDICATIONS:   Spencer Acosta is a 82 y.o. male who presents with anemia, GI bleed and persistent heme positive stools even with switching his anticoagulant.  Inferior vena cava filter is indicated for this reason.  Risks and benefits including filter thrombosis, migration, fracture, bleeding, and infection were all discussed.  We discussed that all IVC filters that we place can be removed if desired from the patient once the need for the filter has passed.    DESCRIPTION: After obtaining full informed written consent, the patient was brought back to the vascular suite. The skin was sterilely prepped and draped in a sterile surgical field was created. Moderate conscious sedation was administered during a face to face encounter with the patient throughout the procedure with my supervision of the RN administering medicines and monitoring the patient's vital signs, pulse oximetry, telemetry and mental status throughout from the start of the procedure until the patient was taken to the recovery room. The left femoral was accessed under direct ultrasound guidance without difficulty with a Seldinger needle and a J-wire was then placed. After skin nick and dilatation, the delivery sheath was placed into the inferior vena cava and an inferior venacavogram was performed. This  demonstrated a patent IVC with the level of the renal veins at the top of L2.  The filter was then deployed into the inferior vena cava at the middle of L2 just below the renal veins. The delivery sheath was then removed. Pressure was held. Sterile dressings were placed. The patient tolerated the procedure well and was taken to the recovery room in stable condition.  COMPLICATIONS: None  CONDITION: Stable  Leotis Pain  11/25/2016, 3:28 PM   This note was created with Dragon Medical transcription system. Any errors in dictation are purely unintentional.

## 2016-11-25 NOTE — H&P (Signed)
[facility] History & Physical Update  The patient was interviewed and re-examined.  The patient's previous History and Physical has been reviewed and see my note from today. We plan to proceed with the scheduled procedure.  Leotis Pain, MD  11/25/2016, 3:01 PM

## 2016-11-25 NOTE — Progress Notes (Signed)
ANTICOAGULATION CONSULT NOTE - Follow up [place] for apixaban Indication: DVT  Allergies  Allergen Reactions  . Sulfa Antibiotics Nausea And Vomiting    Patient Measurements: Height: 5\' 8"  (172.7 cm) Weight: 184 lb (83.5 kg) IBW/kg (Calculated) : 68.4   Vital Signs: Temp: 98.7 F (37.1 C) (12/26 0432) Temp Source: Oral (12/26 0432) BP: 140/72 (12/26 0745) Pulse Rate: 84 (12/26 0745)  Labs:  Recent Labs  11/23/16 0446 11/24/16 0420 11/25/16 0431  HGB 8.4* 8.3* 8.0*  HCT 25.0* 24.8*  --   PLT 138* 138*  --   CREATININE 1.41* 1.35*  --     Estimated Creatinine Clearance: 31.4 mL/min (by C-G formula based on SCr of 1.35 mg/dL (H)).    Assessment: 82 yo male with recent DVT with bleed on Xarelto starting on apixaban, switched to enoxaparin yesterday, and switching back to apixaban. Per discussion with Dr. Leslye Peer, Hgb still trending down and advanced age, will start pt on lower dose of apixaban. Last enoxaparin dose charted as 0500 this AM. Per RN, no s/sx of bleeding noted so far and none reported overnight from previous shift.   12/26: Per RN no reports of s/sx of bleeding overnight. Discussed with MD on rounds, will continue apixaban for now until vascular sees pt. CBC ordered for AM.    Goal of Therapy:  Monitor platelets by anticoagulation protocol: Yes   Plan:  Apixaban 5 mg PO BID at this time. Will need CBC and SCr every three days per policy.   Rayna Sexton L 11/25/2016,1:52 PM

## 2016-11-25 NOTE — Progress Notes (Deleted)
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 male with at least stage II follicular lymphoma.  Ultrasound guided core biopsy of a right inguinal node on 10/21/2016 revealed a low grade B cell lymphoma. The CD10 immunophenotype would favor follicular lymphoma.  Absence of discrete nodular architecture and follicular dendritic cell mesh works along with the plasmacytoid differentiation also raises the possibility of a marginal zone lymphoma.    CT renal stone study on 10/04/2016 revealed interval development of significant adenopathy of the right inguinal region, right iliac nodal chains and right side periaortic lymph nodes (prior scan 02/19/2016) . Lymph node of the right iliac nodal station measured 3.5 x 7.5 cm.  Nodal mass in the right inguinal region measured 5.2 x 7 cm.  CT angiography aortobifemoral with and without contrast on 11/04/2016 revealed right pleural effusion with right lower lobe atelectasis. There was massive retroperitoneal, right iliac and right inguinal lymphadenopathy and venous obstruction of the right lower extremity. There was swelling and subcutaneous edema of the right lower extremity secondary to venous obstruction. There was severe stenosis of the right renal artery, 80% or greater.  There was slower arterial flow to the left leg relative to the right without significant lower extremity arterial occlusion or compromise.  Chest CT scan on 11/14/2016 revealed no significant thoracic adenopathy.  There was persistent right  pleural effusion and right lower lobe airspace consolidation.  There was upper abdominal adenopathy.  Right lower extremity duplex on 11/04/2016 revealed an acute nonocclusive deep venous thromboses in the right common femoral vein. There was a large right inguinal node measuring 8.5 x 5.8 x 4.8 cm.  He is on Xarelto.  Negative studies on 11/12/2016 included:  hepatitis B core antibody, hepatitis B surface antigen and hepatitis C antibody.  G6PD assay was normal on 11/12/2012.  He is on allopurinol.  Code status:  DNR.  No intubation, CPR and cardioversion.  No dialysis.  Decision was made to proceed with single agent Rituxan initially weekly 4 cycles followed by every 6 months for 2 years. He was supposed to get his first dose of Rituxan on 11/18/2016 which was held due to hospitalization  Interval history- ***  ECOG PS- *** Pain scale- *** Opioid associated constipation- ***  Review of systems- ROS   Current treatment- yet to start  Allergies  Allergen Reactions  . Sulfa Antibiotics Nausea And Vomiting     Past Medical History:  Diagnosis Date  . A-fib ([place])   . CKD (chronic kidney disease)   . Crohn's disease ([place])   . Diabetes mellitus without complication ([place])   . Dysrhythmia   . GI bleed   . Hypertension   . Hypertriglyceridemia 03/11/2014  . Lymphoma ([place])   . PVD (peripheral vascular disease) ([place])      Past Surgical History:  Procedure Laterality Date  . APPENDECTOMY    . TURP VAPORIZATION      Social History   Social History  . Marital status: Widowed    Spouse name: N/A  . Number of children: N/A  . Years of education:  N/A   Occupational History  . Not on file.   Social History Main Topics  . Smoking status: Former Smoker    Quit date: 10/22/1947  . Smokeless tobacco: Never Used     Comment: quit 70 years ago  . Alcohol use No     Comment: occasional  . Drug use: No  . Sexual activity: Not on file   Other Topics Concern  . Not on  file   Social History Narrative  . No narrative on file    Family History  Problem Relation Age of Onset  . Diabetes Other   . Diabetes Mother     No current facility-administered medications for this visit.  No current outpatient prescriptions on file.  Facility-Administered Medications Ordered in Other Visits:  .  acetaminophen (TYLENOL) tablet 650 mg, 650 mg, Oral, Q6H PRN **OR** acetaminophen (TYLENOL) suppository 650 mg, 650 mg, Rectal, Q6H PRN, Henreitta Leber, MD .  acidophilus (RISAQUAD) capsule 1 capsule, 1 capsule, Oral, BID, Henreitta Leber, MD, 1 capsule at 11/24/16 2049 .  allopurinol (ZYLOPRIM) tablet 100 mg, 100 mg, Oral, Daily, Loletha Grayer, MD, 100 mg at 11/24/16 0842 .  apixaban (ELIQUIS) tablet 5 mg, 5 mg, Oral, BID, Loletha Grayer, MD, 5 mg at 11/25/16 0537 .  budesonide (PULMICORT) nebulizer solution 0.5 mg, 0.5 mg, Nebulization, BID, Henreitta Leber, MD, 0.5 mg at 11/25/16 [id number] .  cholecalciferol (VITAMIN D) tablet 1,000 Units, 1,000 Units, Oral, Daily, Henreitta Leber, MD, 1,000 Units at 11/24/16 [phone] .  guaiFENesin-dextromethorphan (ROBITUSSIN DM) 100-10 MG/5ML syrup 5 mL, 5 mL, Oral, Q4H PRN, Henreitta Leber, MD, 5 mL at 11/22/16 0022 .  insulin aspart (novoLOG) injection 0-15 Units, 0-15 Units, Subcutaneous, TID WC, Henreitta Leber, MD, 5 Units at 11/24/16 1715 .  ipratropium-albuterol (DUONEB) 0.5-2.5 (3) MG/3ML nebulizer solution 3 mL, 3 mL, Nebulization, TID, Loletha Grayer, MD, 3 mL at 11/25/16 0810 .  latanoprost (XALATAN) 0.005 % ophthalmic solution 1 drop, 1 drop, Both Eyes, QHS, Henreitta Leber, MD, 1 drop at 11/24/16 2050 .  levofloxacin (LEVAQUIN) tablet 750 mg, 750 mg, Oral, Q48H, Loletha Grayer, MD, 750 mg at 11/24/16 1254 .  Mesalamine (ASACOL) DR capsule 800 mg, 800 mg, Oral, TID, Henreitta Leber, MD, 800 mg at 11/24/16 2049 .  methylPREDNISolone sodium succinate (SOLU-MEDROL) 40 mg/mL injection 40 mg, 40 mg, Intravenous, Q24H, Henreitta Leber, MD, 40 mg at 11/24/16 0842 .  metoprolol tartrate (LOPRESSOR) tablet 25 mg, 25 mg, Oral, BID, Henreitta Leber, MD, 25 mg at 11/24/16 2049 .  multivitamin-lutein (OCUVITE-LUTEIN) capsule 1 capsule, 1 capsule, Oral, Daily, Henreitta Leber, MD, 1 capsule at 11/24/16 [phone] .  nystatin (MYCOSTATIN) 100000 UNIT/ML suspension 500,000 Units, 5 mL, Oral, QID, Loletha Grayer, MD, 500,000 Units at 11/24/16 2050 .  ondansetron (ZOFRAN) tablet 4 mg, 4 mg, Oral, Q6H PRN **OR** ondansetron (ZOFRAN) injection 4 mg, 4 mg, Intravenous, Q6H PRN, Henreitta Leber, MD  Physical exam: There were no vitals filed for this visit. Physical Exam   CMP Latest Ref Rng & Units 11/24/2016  Glucose 65 - 99 mg/dL 133(H)  BUN 6 - 20 mg/dL 56(H)  Creatinine 0.61 - 1.24 mg/dL 1.35(H)  Sodium 135 - 145 mmol/L 146(H)  Potassium 3.5 - 5.1 mmol/L 3.6  Chloride 101 - 111 mmol/L 116(H)  CO2 22 - 32 mmol/L 26  Calcium 8.9 - 10.3 mg/dL 8.5(L)  Total Protein 6.5 - 8.1 g/dL -  Total Bilirubin 0.3 - 1.2  mg/dL -  Alkaline Phos 38 - 126 U/L -  AST 15 - 41 U/L -  ALT 17 - 63 U/L -   CBC Latest Ref Rng & Units 11/25/2016  WBC 3.8 - 10.6 K/uL -  Hemoglobin 13.0 - 18.0 g/dL 8.0(L)  Hematocrit 40.0 - 52.0 % -  Platelets 150 - 440 K/uL -    No images are attached to the encounter.  Dg Chest 1 View  Result Date: 11/24/2016 CLINICAL DATA:  Cough, shortness of breath, and wheezing.  Lymphoma. EXAM: CHEST 1 VIEW COMPARISON:  11/18/2016 FINDINGS: The cardiac silhouette remains enlarged. Aortic atherosclerosis is noted. There is a persistent small right pleural effusion with right lower lobe airspace consolidation, similar to the prior study allowing for differences in patient positioning. Right upper lobe airspace consolidation has improved. Minimal left basilar opacity likely reflects atelectasis. No pneumothorax. IMPRESSION: 1. Unchanged right pleural effusion and right lower lobe consolidation compatible with pneumonia. Improved  right upper lobe consolidation. 2. Aortic atherosclerosis. Electronically Signed   By: Logan Bores M.D.   On: 11/24/2016 09:36   Dg Chest 2 View  Result Date: 11/18/2016 CLINICAL DATA:  Cough. EXAM: CHEST  2 VIEW COMPARISON:  CT 11/14/2016.  Chest x-ray 03/17/2016. FINDINGS: Right upper lobe and right lower lobe pulmonary infiltrates consistent with pneumonia. Small right pleural effusion. Cardiomegaly with normal pulmonary vascularity . IMPRESSION: Prominent right upper lobe and right lower lobe infiltrates consistent with pneumonia. Small right pleural effusion. Electronically Signed   By: Marcello Moores  Register   On: 11/18/2016 09:05   Ct Chest Wo Contrast  Result Date: 11/14/2016 CLINICAL DATA:  History of lymphoma. EXAM: CT CHEST WITHOUT CONTRAST TECHNIQUE: Multidetector CT imaging of the chest was performed following the standard protocol without IV contrast. COMPARISON:  None. FINDINGS: Cardiovascular: Moderate to marked cardiac enlargement. Aortic atherosclerosis noted. Calcification in the LAD and RCA coronary artery noted. No pericardial effusion. Mediastinum/Nodes: No supraclavicular or axillary lymph nodes. No significant mediastinal or hilar adenopathy. Lungs/Pleura: Small to moderate right pleural effusion identified. Airspace consolidation within the anterior right lower lobe is identified, image 117 of series 3. Right middle lobe subpleural nodule measures 4 mm, image 97 of series 3. Upper Abdomen: No acute abnormality identified. Stones identified within the gallbladder. There is abdominal aortic atherosclerosis. Retroperitoneal adenopathy as described on CT from 11/04/2016. Bilateral kidney cysts are present which are incompletely characterized without IV contrast. Musculoskeletal: No chest wall mass or suspicious bone lesions identified. IMPRESSION: 1. No significant thoracic adenopathy identified. 2. Cardiac enlargement, aortic atherosclerosis and multi vessel coronary artery calcification.  3. Persistent right pleural effusion and right lower lobe airspace consolidation. 4. Upper abdominal adenopathy as described on CT from 11/04/2016. Electronically Signed   By: Kerby Moors M.D.   On: 11/14/2016 15:46   Ct Angio Aortobifemoral W And/or Wo Contrast  Result Date: 11/04/2016 CLINICAL DATA:  Acute presentation with right leg redness and swelling beginning yesterday. Painful. EXAM: CT ANGIOGRAPHY AOBIFEM WITHOUT AND WITH CONTRAST<Procedure Description>CT ANGIOGRAPHY AOBIFEM WITHOUT AND WITH CONTRAST TECHNIQUE: CT angiography of the aorta and lower extremities. CONTRAST:  100 cc Isovue 370 COMPARISON:  10/04/2016 . FINDINGS: Right pleural effusion with right lower lobe atelectasis. Liver is normal except for 1 cm low-density in the right lobe likely to be benign. Present previously and unchanged. Multiple calcified gallstones. 1.5 cm low-density in the pancreatic body as seen previously. Spleen is normal. Adrenal glands are normal. Renal cysts as previously seen. Massive progression of retroperitoneal all lymphadenopathy and right  inguinal and iliac adenopathy since the previous study. The differential diagnosis is lymphoma versus metastatic disease. This quite likely results and venous obstruction of the right lower extremity. No primary bowel pathology. Bladder is unremarkable. Prostate gland is small. Chronic degenerative changes affect the spine. No destructive bone findings. There is diffuse aortic atherosclerosis. No aneurysm. Celiac origin shows 30% stenosis. Superior mesenteric artery origin is widely patent. No significant distal stenosis is seen. Bilateral renal artery atherosclerosis. Severe stenosis at the proximal right renal artery, possibly 80% or greater. 30% stenosis of the left renal artery. Inferior mesenteric artery is patent with 30% proximal stenosis. Diffuse iliac atherosclerosis without stenosis more than 25 or 30%. Both common femoral artery show atherosclerosis but no  stenosis greater than approximately 30%. There is slower flow to the left leg than the right, but the femoral popliteal arteries in the lower leg runoff vessels are patent bilaterally to the foot. There is diffuse swelling in of the right lower extremity with edema probably secondary to venous obstruction. Review of the MIP images confirms the above findings. IMPRESSION: Right pleural effusion with right lower lobe atelectasis. Massive retroperitoneal, right iliac and right inguinal lymphadenopathy, progressive since the previous study and resulting in venous obstruction of the right lower extremity. Swelling and subcutaneous edema of the right lower extremity secondary to venous obstruction. This could be lymphoma or metastatic disease, with lymphoma favored. Aortic atherosclerosis. Atherosclerosis of the branch vessels. Severe stenosis of the right renal artery, 80% or greater. Slower arterial flow to the left leg relative to the right, but without significant lower extremity arterial occlusion or compromise on either side. Chololithiasis. No change in 1.5 cm pancreatic low-density. Electronically Signed   By: Nelson Chimes M.D.   On: 11/04/2016 19:23   US Venous Img Lower Unilateral Right  Result Date: 11/04/2016 CLINICAL DATA:  Right lower extremity pain and swelling for 2 weeks. EXAM: Right LOWER EXTREMITY VENOUS DOPPLER ULTRASOUND TECHNIQUE: Gray-scale sonography with graded compression, as well as color Doppler and duplex ultrasound were performed to evaluate the lower extremity deep venous systems from the level of the common femoral vein and including the common femoral, femoral, profunda femoral, popliteal and calf veins including the posterior tibial, peroneal and gastrocnemius veins when visible. The superficial great saphenous vein was also interrogated. Spectral Doppler was utilized to evaluate flow at rest and with distal augmentation maneuvers in the common femoral, femoral and popliteal veins.  COMPARISON:  None. FINDINGS: Contralateral Common Femoral Vein: Respiratory phasicity is normal and symmetric with the symptomatic side. No evidence of thrombus. Normal compressibility. Common Femoral Vein: Nonocclusive thrombus is noted. Partial flow is noted. Saphenofemoral Junction: No evidence of thrombus. Normal compressibility and flow on color Doppler imaging. Profunda Femoral Vein: No evidence of thrombus. Normal flow on color Doppler imaging. Unable to compress due to large mass in right groin. Femoral Vein: No evidence of thrombus. Normal respiratory phasicity and response to augmentation. Unable to compress due to large mass in right groin. Popliteal Vein: No evidence of thrombus. Normal compressibility, respiratory phasicity and response to augmentation. Calf Veins: No evidence of thrombus. Normal compressibility and flow on color Doppler imaging. Superficial Great Saphenous Vein: No evidence of thrombus. Normal compressibility and flow on color Doppler imaging. Venous Reflux:  None. Other Findings: Multiple large right inguinal lymph nodes are noted, with the largest measuring 8.5 x 5.8 x 4.8 cm. This lymph node has been recently biopsied. IMPRESSION: Acute nonocclusive deep venous thrombosis seen in right common femoral vein. Large  right inguinal lymph node or mass is noted which has been previously biopsied. Critical Value/emergent results were called by telephone at the time of interpretation on 11/04/2016 at 7:46 pm to Dr. Lenise Arena , who verbally acknowledged these results. Electronically Signed   By: Marijo Conception, M.D.   On: 11/04/2016 19:43     Assessment and plan- Patient is a 82 y.o. male ***   Visit Diagnosis No diagnosis found.   Dr. Randa Evens, MD, MPH [place] [facility] Pager- [phone] 11/25/2016 8:37 AM

## 2016-11-25 NOTE — Progress Notes (Signed)
PT Cancellation Note  Patient Details Name: DONTERIO PAPPALARDO MRN: [id number] DOB: 06-Jul-1915   Cancelled Treatment:    Reason Eval/Treat Not Completed: Patient at procedure or test/unavailable.  Pt currently off floor for procedure.  Will re-attempt PT treatment at a later date/time as medically appropriate.   Raquel Sarna Mittie Knittel 11/25/2016, 3:28 PM Leitha Bleak, [place]

## 2016-11-26 ENCOUNTER — Encounter: Payer: Self-pay | Admitting: Vascular Surgery

## 2016-11-26 LAB — TYPE AND SCREEN
ABO/RH(D): A POS
ANTIBODY SCREEN: NEGATIVE

## 2016-11-26 LAB — GLUCOSE, CAPILLARY
GLUCOSE-CAPILLARY: 115 mg/dL — AB (ref 65–99)
Glucose-Capillary: 225 mg/dL — ABNORMAL HIGH (ref 65–99)

## 2016-11-26 LAB — CBC
HCT: 24.4 % — ABNORMAL LOW (ref 40.0–52.0)
Hemoglobin: 8.1 g/dL — ABNORMAL LOW (ref 13.0–18.0)
MCH: 32.1 pg (ref 26.0–34.0)
MCHC: 33.2 g/dL (ref 32.0–36.0)
MCV: 96.7 fL (ref 80.0–100.0)
PLATELETS: 145 10*3/uL — AB (ref 150–440)
RBC: 2.52 MIL/uL — ABNORMAL LOW (ref 4.40–5.90)
RDW: 15.8 % — AB (ref 11.5–14.5)
WBC: 12.4 10*3/uL — ABNORMAL HIGH (ref 3.8–10.6)

## 2016-11-26 MED ORDER — BUDESONIDE 0.5 MG/2ML IN SUSP: 0.5000 mg | Freq: Two times a day (BID) | RESPIRATORY_TRACT | 0 refills | Status: AC

## 2016-11-26 MED ORDER — SODIUM CHLORIDE 0.9 % IV SOLN
200.0000 mg | Freq: Once | INTRAVENOUS | Status: AC
  Administered 2016-11-26: 11:00:00 200 mg via INTRAVENOUS
  Filled 2016-11-26: qty 10

## 2016-11-26 MED ORDER — IPRATROPIUM-ALBUTEROL 0.5-2.5 (3) MG/3ML IN SOLN: 3.0000 mL | Freq: Three times a day (TID) | RESPIRATORY_TRACT | 0 refills | Status: AC

## 2016-11-26 MED ORDER — ALLOPURINOL 100 MG PO TABS
100.0000 mg | ORAL_TABLET | Freq: Every day | ORAL | 0 refills | Status: AC
Start: 2016-11-27 — End: ?

## 2016-11-26 MED ORDER — NYSTATIN 100000 UNIT/ML MT SUSP: 5.0000 mL | Freq: Four times a day (QID) | OROMUCOSAL | 0 refills | Status: AC

## 2016-11-26 MED ORDER — PREDNISONE 5 MG PO TABS: ORAL_TABLET | ORAL | 0 refills | Status: AC

## 2016-11-26 NOTE — Discharge Summary (Signed)
[facility] NAME: Spencer Acosta    MR#:  [id number]  DATE OF BIRTH:  Aug 22, 1915  DATE OF ADMISSION:  11/18/2016 ADMITTING PHYSICIAN: Henreitta Leber, MD  DATE OF DISCHARGE: 12/ 27/2017  PRIMARY CARE PHYSICIAN: Kirk Ruths., MD    ADMISSION DIAGNOSIS:  Healthcare-associated pneumonia [J18.9]  DISCHARGE DIAGNOSIS:  Active Problems:   Pneumonia   SECONDARY DIAGNOSIS:   Past Medical History:  Diagnosis Date  . A-fib ([place])   . CKD (chronic kidney disease)   . Crohn's disease ([place])   . Diabetes mellitus without complication ([place])   . Dysrhythmia   . GI bleed   . Hypertension   . Hypertriglyceridemia 03/11/2014  . Lymphoma ([place])   . PVD (peripheral vascular disease) ([place])     HOSPITAL COURSE:   1.  Acute hypoxic respiratory failure. This has improved during the hospital course and now breathing on room air. 2. Pneumonia with Right upper lobe and lower lobe infiltrates. The patient received aggressive antibiotics during the hospital course and then Just Zosyn and Then Switched over to Oral Levaquin. Patient Finished the Entire Course of Antibiotics during the Hospital Stay. 3. Bronchospasm and wheeze. The patient continued to have decreased air entry. We did give IV steroids during the hospital course and DuoNeb and budesonide nebulizers. I will continue budesonide and DuoNeb nebulizers at [facility] with a taper of prednisone. 4. Acute hemorrhagic anemia with blood in the stool and hematuria. The patient was on Xarelto at that time. We did stop Xarelto and then tried to restart eloquent but the patient's hemoglobin had drifted down and the patient had trace blood in the stool. Dr. Lucky Cowboy placed an IVC filter on 11/25/2016. Anticoagulation stopped.  I did prescribe IV venefer for on the day of discharge. Hemoglobin upon discharge 8.1. 5. DVT in the right lower extremity. Status post IVC filter placement. No anticoagulation  secondary to GI bleed during the hospital course and anemia. 6. Chronic kidney disease stage III 7. UTI with enterococcus. Antibiotics that the patient was on would cover this fully. 8. Follicular lymphoma. Follow-up with cancer Center as outpatient for consideration of treatment. 9. With the patient's age and multiple medical issues, the patient's overall prognosis is poor. Patient is a DO NOT RESUSCITATE.  DISCHARGE CONDITIONS:   Fair  CONSULTS OBTAINED:  Treatment Team:  Evaristo Bury, MD Algernon Huxley, MD  DRUG ALLERGIES:   Allergies  Allergen Reactions  . Sulfa Antibiotics Nausea And Vomiting    DISCHARGE MEDICATIONS:   Current Discharge Medication List    START taking these medications   Details  budesonide (PULMICORT) 0.5 MG/2ML nebulizer solution Take 2 mLs (0.5 mg total) by nebulization 2 (two) times daily. Qty: 120 mL, Refills: 0    ipratropium-albuterol (DUONEB) 0.5-2.5 (3) MG/3ML SOLN Take 3 mLs by nebulization 3 (three) times daily. Qty: 360 mL, Refills: 0    nystatin (MYCOSTATIN) 100000 UNIT/ML suspension Take 5 mLs (500,000 Units total) by mouth 4 (four) times daily. Qty: 60 mL, Refills: 0      CONTINUE these medications which have CHANGED   Details  allopurinol (ZYLOPRIM) 100 MG tablet Take 1 tablet (100 mg total) by mouth daily. Qty: 30 tablet, Refills: 0    predniSONE (DELTASONE) 5 MG tablet Take 6 tabs po day1; take 5 tabs po day2; take 4 tabs po day3; take 3 tabs po day4; take 2 tabs po day5; take 1 tab po day6 Qty: 21 tablet,  Refills: 0      CONTINUE these medications which have NOT CHANGED   Details  acidophilus (RISAQUAD) CAPS capsule Take 1 capsule by mouth 2 (two) times daily. Qty: 60 capsule, Refills: 0    cholecalciferol (VITAMIN D) 1000 units tablet Take 1,000 Units by mouth daily.    Mesalamine (ASACOL) 400 MG CPDR DR capsule Take 2 capsules (800 mg total) by mouth 3 (three) times daily. Qty: 180 capsule, Refills: 0    metoprolol  tartrate (LOPRESSOR) 25 MG tablet Take 1 tablet (25 mg total) by mouth 2 (two) times daily. Qty: 60 tablet, Refills: 0    multivitamin-lutein (OCUVITE-LUTEIN) CAPS capsule Take 1 capsule by mouth daily.    latanoprost (XALATAN) 0.005 % ophthalmic solution Place 1 drop into both eyes at bedtime.      STOP taking these medications     azithromycin (ZITHROMAX Z-PAK) 250 MG tablet      Rivaroxaban 15 & 20 MG TBPK      albuterol (PROVENTIL HFA;VENTOLIN HFA) 108 (90 Base) MCG/ACT inhaler      chlorambucil (LEUKERAN) 2 MG tablet          DISCHARGE INSTRUCTIONS:   Follow-up with Dr. Grayland Ormond Follow-up with Dr. at [facility]  If you experience worsening of your admission symptoms, develop shortness of breath, life threatening emergency, suicidal or homicidal thoughts you must seek medical attention immediately by calling 911 or calling your MD immediately  if symptoms less severe.  You Must read complete instructions/literature along with all the possible adverse reactions/side effects for all the Medicines you take and that have been prescribed to you. Take any new Medicines after you have completely understood and accept all the possible adverse reactions/side effects.   Please note  You were cared for by a hospitalist during your hospital stay. If you have any questions about your discharge medications or the care you received while you were in the hospital after you are discharged, you can call the unit and asked to speak with the hospitalist on call if the hospitalist that took care of you is not available. Once you are discharged, your primary care physician will handle any further medical issues. Please note that NO REFILLS for any discharge medications will be authorized once you are discharged, as it is imperative that you return to your primary care physician (or establish a relationship with a primary care physician if you do not have one) for your aftercare needs so that they can  reassess your need for medications and monitor your lab values.    Today   CHIEF COMPLAINT:   Chief Complaint  Patient presents with  . Weakness  . Nausea  . Shortness of Breath    HISTORY OF PRESENT ILLNESS:  Spencer Acosta  is a 82 y.o. male presented with weakness and nausea and shortness of breath and found to have a pneumonia   VITAL SIGNS:  Blood pressure 133/75, pulse 85, temperature 98.1 F (36.7 C), temperature source Oral, resp. rate 17, height 5\' 8"  (1.727 m), weight 83.5 kg (184 lb), SpO2 95 %.    PHYSICAL EXAMINATION:  GENERAL:  82 y.o.-year-old patient lying in the bed with no acute distress.  EYES: Pupils equal, round, reactive to light and accommodation. No scleral icterus. Extraocular muscles intact.  HEENT: Head atraumatic, normocephalic. Oropharynx and nasopharynx clear.  NECK:  Supple, no jugular venous distention. No thyroid enlargement, no tenderness.  LUNGS: Decreased breath sounds bilaterally, slight expiratory wheezing, no rales,rhonchi or crepitation. No use  of accessory muscles of respiration.  CARDIOVASCULAR: S1, S2 normal. No murmurs, rubs, or gallops.  ABDOMEN: Soft, non-tender, non-distended. Bowel sounds present. No organomegaly or mass.  EXTREMITIES: Trace edema, no cyanosis, or clubbing.  NEUROLOGIC: Cranial nerves II through XII are intact. Muscle strength 5/5 in all extremities. Sensation intact. Gait not checked.  PSYCHIATRIC: The patient is alert and answers questions.  SKIN: No obvious rash, lesion, or ulcer.   DATA REVIEW:   CBC  Recent Labs Lab 11/26/16 0627  WBC 12.4*  HGB 8.1*  HCT 24.4*  PLT 145*    Chemistries   Recent Labs Lab 11/24/16 0420  NA 146*  K 3.6  CL 116*  CO2 26  GLUCOSE 133*  BUN 56*  CREATININE 1.35*  CALCIUM 8.5*    Microbiology Results  Results for orders placed or performed during the hospital encounter of 11/18/16  Culture, blood (Routine x 2)     Status: None   Collection Time: 11/18/16   8:32 AM  Result Value Ref Range Status   Specimen Description BLOOD RIGHT FOREARM  Final   Special Requests   Final    BOTTLES DRAWN AEROBIC AND ANAEROBIC AER 10ML ANA 11ML   Culture NO GROWTH 5 DAYS  Final   Report Status 11/23/2016 FINAL  Final  Culture, blood (Routine x 2)     Status: None   Collection Time: 11/18/16  8:32 AM  Result Value Ref Range Status   Specimen Description BLOOD LEFT AC  Final   Special Requests   Final    BOTTLES DRAWN AEROBIC AND ANAEROBIC AER 10ML ANA 13ML   Culture NO GROWTH 5 DAYS  Final   Report Status 11/23/2016 FINAL  Final  Urine culture     Status: Abnormal   Collection Time: 11/18/16  9:54 AM  Result Value Ref Range Status   Specimen Description URINE, RANDOM  Final   Special Requests NONE  Final   Culture >=100,000 COLONIES/mL ENTEROCOCCUS FAECALIS (A)  Final   Report Status 11/22/2016 FINAL  Final   Organism ID, Bacteria ENTEROCOCCUS FAECALIS (A)  Final      Susceptibility   Enterococcus faecalis - MIC*    AMPICILLIN <=2 SENSITIVE Sensitive     LEVOFLOXACIN 0.5 SENSITIVE Sensitive     NITROFURANTOIN <=16 SENSITIVE Sensitive     VANCOMYCIN 4 SENSITIVE Sensitive     * >=100,000 COLONIES/mL ENTEROCOCCUS FAECALIS     Management plans discussed with the patient, family and they are in agreement.  CODE STATUS:     Code Status Orders        Start     Ordered   11/18/16 1139  Do not attempt resuscitation (DNR)  Continuous    Question Answer Comment  In the event of cardiac or respiratory ARREST Do not call a "code blue"   In the event of cardiac or respiratory ARREST Do not perform Intubation, CPR, defibrillation or ACLS   In the event of cardiac or respiratory ARREST Use medication by any route, position, wound care, and other measures to relive pain and suffering. May use oxygen, suction and manual treatment of airway obstruction as needed for comfort.      11/18/16 1138    Code Status History    Date Active Date Inactive Code  Status Order ID Comments User Context   11/04/2016 11:26 PM 11/06/2016  2:47 PM DNR [id number]  Lance Coon, MD Inpatient   10/04/2016  8:29 PM 10/05/2016 12:55 AM DNR [id number]  Hinda Kehr, MD  ED   02/19/2016  9:28 PM 02/22/2016  5:43 PM DNR [id number]  Gladstone Lighter, MD Inpatient    Advance Directive Documentation   Flowsheet Row Most Recent Value  Type of Advance Directive  Healthcare Power of Attorney  Pre-existing out of facility DNR order (yellow form or pink MOST form)  No data  "MOST" Form in Place?  No data      TOTAL TIME TAKING CARE OF THIS PATIENT: 35 minutes.    Loletha Grayer M.D on 11/26/2016 at 10:01 AM  Between 7am to 6pm - Pager - [phone]  After 6pm go to [email or web address] - password [email or web address]  [employer] Office  [phone]  CC: Primary care physician; Kirk Ruths., MD

## 2016-11-26 NOTE — Progress Notes (Signed)
Pharmacy Antibiotic Note  GOD PEACHES is a 82 y.o. male admitted on 11/18/2016 with pneumonia.  Pharmacy has been consulted for Levaquin dosing.  Plan: Will continue Levaquin 750 mg PO q48h.   Height: 5\' 8"  (172.7 cm) Weight: 184 lb (83.5 kg) IBW/kg (Calculated) : 68.4  Temp (24hrs), Avg:98.1 F (36.7 C), Min:98 F (36.7 C), Max:98.1 F (36.7 C)   Recent Labs Lab 11/20/16 0535 11/21/16 0212 11/22/16 0512 11/23/16 0446 11/24/16 0420 11/26/16 0627  WBC 17.9*  --  10.4 10.1 11.1* 12.4*  CREATININE 1.42* 1.42* 1.32* 1.41* 1.35*  --     Estimated Creatinine Clearance: 31.4 mL/min (by C-G formula based on SCr of 1.35 mg/dL (H)).    Allergies  Allergen Reactions  . Sulfa Antibiotics Nausea And Vomiting    Antimicrobials this admission: Vancomycin 12/19 Zosyn 12/19 >> 12/25 Levaquin 12/25 >>   Dose adjustments this admission:   Thank you for allowing pharmacy to be a part of this patient's care.  Rayna Sexton, PharmD, BCPS Clinical Pharmacist 11/26/2016 7:54 AM

## 2016-11-26 NOTE — Clinical Social Work Note (Signed)
Patient to be d/c'ed today to [facility]. Patient and family agreeable to plans will transport via ems RN to call report to [phone] Room 228.  Evette Cristal, MSW, LCSWA Mon-Fri 8a-4:30p [phone]

## 2016-11-26 NOTE — Progress Notes (Signed)
While rounding, Spencer Acosta made initial visit to room 108. Pt was awake, alert, and exceptionally pleasant. Daughter was bedside. Pt is hoping for discharge today. CH provided the ministry of conversation and prayer. CH is available for follow up as needed.    11/26/16 1400  Clinical Encounter Type  Visited With Patient;Patient and family together  Visit Type Initial;Spiritual support  Referral From Nurse

## 2016-11-26 NOTE — Progress Notes (Signed)
Pt being discharged to [facility], daughter at bedside, called report to wanda/patty, EMS will transport pt, pt with no complaints, no distress or discomfort noted

## 2016-11-26 NOTE — Discharge Instructions (Signed)
Community-Acquired Pneumonia, Adult °Pneumonia is an infection of the lungs. There are different types of pneumonia. One type can develop while a person is in a hospital. A different type, called community-acquired pneumonia, develops in people who are not, or have not recently been, in the hospital or other health care facility. °What are the causes? °Pneumonia may be caused by bacteria, viruses, or funguses. Community-acquired pneumonia is often caused by Streptococcus pneumonia bacteria. These bacteria are often passed from one person to another by breathing in droplets from the cough or sneeze of an infected person. °What increases the risk? °The condition is more likely to develop in: °· People who have chronic diseases, such as chronic obstructive pulmonary disease (COPD), asthma, congestive heart failure, cystic fibrosis, diabetes, or kidney disease. °· People who have early-stage or late-stage HIV. °· People who have sickle cell disease. °· People who have had their spleen removed (splenectomy). °· People who have poor dental hygiene. °· People who have medical conditions that increase the risk of breathing in (aspirating) secretions their own mouth and nose. °· People who have a weakened immune system (immunocompromised). °· People who smoke. °· People who travel to areas where pneumonia-causing germs commonly exist. °· People who are around animal habitats or animals that have pneumonia-causing germs, including birds, bats, rabbits, cats, and farm animals. ° °What are the signs or symptoms? °Symptoms of this condition include: °· A dry cough. °· A wet (productive) cough. °· Fever. °· Sweating. °· Chest pain, especially when breathing deeply or coughing. °· Rapid breathing or difficulty breathing. °· Shortness of breath. °· Shaking chills. °· Fatigue. °· Muscle aches. ° °How is this diagnosed? °Your health care provider will take a medical history and perform a physical exam. You may also have other tests,  including: °· Imaging studies of your chest, including X-rays. °· Tests to check your blood oxygen level and other blood gases. °· Other tests on blood, mucus (sputum), fluid around your lungs (pleural fluid), and urine. ° °If your pneumonia is severe, other tests may be done to identify the specific cause of your illness. °How is this treated? °The type of treatment that you receive depends on many factors, such as the cause of your pneumonia, the medicines you take, and other medical conditions that you have. For most adults, treatment and recovery from pneumonia may occur at home. In some cases, treatment must happen in a hospital. Treatment may include: °· Antibiotic medicines, if the pneumonia was caused by bacteria. °· Antiviral medicines, if the pneumonia was caused by a virus. °· Medicines that are given by mouth or through an IV tube. °· Oxygen. °· Respiratory therapy. ° °Although rare, treating severe pneumonia may include: °· Mechanical ventilation. This is done if you are not breathing well on your own and you cannot maintain a safe blood oxygen level. °· Thoracentesis. This procedure removes fluid around one lung or both lungs to help you breathe better. ° °Follow these instructions at home: °· Take over-the-counter and prescription medicines only as told by your health care provider. °? Only take cough medicine if you are losing sleep. Understand that cough medicine can prevent your body’s natural ability to remove mucus from your lungs. °? If you were prescribed an antibiotic medicine, take it as told by your health care provider. Do not stop taking the antibiotic even if you start to feel better. °· Sleep in a semi-upright position at night. Try sleeping in a reclining chair, or place a few pillows under your head. °· Do not use tobacco products, including cigarettes, chewing   tobacco, and e-cigarettes. If you need help quitting, ask your health care provider. °· Drink enough water to keep your urine  clear or pale yellow. This will help to thin out mucus secretions in your lungs. °How is this prevented? °There are ways that you can decrease your risk of developing community-acquired pneumonia. Consider getting a pneumococcal vaccine if: °· You are older than 80 years of age. °· You are older than 80 years of age and are undergoing cancer treatment, have chronic lung disease, or have other medical conditions that affect your immune system. Ask your health care provider if this applies to you. ° °There are different types and schedules of pneumococcal vaccines. Ask your health care provider which vaccination option is best for you. °You may also prevent community-acquired pneumonia if you take these actions: °· Get an influenza vaccine every year. Ask your health care provider which type of influenza vaccine is best for you. °· Go to the dentist on a regular basis. °· Wash your hands often. Use hand sanitizer if soap and water are not available. ° °Contact a health care provider if: °· You have a fever. °· You are losing sleep because you cannot control your cough with cough medicine. °Get help right away if: °· You have worsening shortness of breath. °· You have increased chest pain. °· Your sickness becomes worse, especially if you are an older adult or have a weakened immune system. °· You cough up blood. °This information is not intended to replace advice given to you by your health care provider. Make sure you discuss any questions you have with your health care provider. °Document Released: 11/17/2005 Document Revised: 03/27/2016 Document Reviewed: 03/14/2015 °[employer]vier Interactive Patient Education © 2017 [employer] ° °

## 2016-11-27 DIAGNOSIS — J181 Lobar pneumonia, unspecified organism: Secondary | ICD-10-CM

## 2016-11-27 DIAGNOSIS — J918 Pleural effusion in other conditions classified elsewhere: Secondary | ICD-10-CM | POA: Diagnosis not present

## 2016-11-27 DIAGNOSIS — E119 Type 2 diabetes mellitus without complications: Secondary | ICD-10-CM

## 2016-11-27 DIAGNOSIS — I481 Persistent atrial fibrillation: Secondary | ICD-10-CM

## 2016-11-27 DIAGNOSIS — R410 Disorientation, unspecified: Secondary | ICD-10-CM | POA: Diagnosis not present

## 2016-11-27 DIAGNOSIS — C8290 Follicular lymphoma, unspecified, unspecified site: Secondary | ICD-10-CM | POA: Diagnosis not present

## 2016-11-27 DIAGNOSIS — I82401 Acute embolism and thrombosis of unspecified deep veins of right lower extremity: Secondary | ICD-10-CM

## 2016-11-28 ENCOUNTER — Ambulatory Visit: Payer: Medicare Other | Admitting: Podiatry

## 2016-12-01 NOTE — Progress Notes (Signed)
[facility] day:  12/02/2016   Chief Complaint: Spencer Acosta is a 83 y.o. male with stage II bulky follicular lymphoma who is seen for assessment following interval hospitalization.  HPI:  The patient was last seen in the medical oncology on 11/17/2016.  At that time, he noted a cough x 2 days.  He had no fever.  Chest CT persistent right pleural effusion and right lower lobe airspace consolidation.  He was started on azithromycin.    He was admitted to [facility] from 11/18/2016 - 11/26/2016.   He presented with shortness of breath.  CXR revealed right upper lobe and right lower lobe infiltrates.  He was treated with vancomycin and Zosyn and then Zosyn alone.  He was also diagnosed with a enterococcus faecalis UTI.  He was discharged on Levaquin.  During his hospitalization, he was noted to have blood in the stool and hematuria.  An IVC filter was placed on 11/25/2016.  Xarelto was stopped.  He received Venofer x 1.  CBC on 11/26/2016 revealed a hematocrit of 24.4, hemoglobin 8.1, MCV 96.7, platelets 145,000, and WBC 12,400.[name] Kitchen  He was discharged to [facility] for rehabilitation.  Symptomatically, he feels "pretty good".  He has a little cough if he exerts himself.  He he has a little phlegm.  He denies any fever.  He is walking and going to the gym at [facility].  He feels ready to start treatment.   Past Medical History:  Diagnosis Date  . A-fib ([place])   . CKD (chronic kidney disease)   . Crohn's disease ([place])   . Diabetes mellitus without complication ([place])   . Dysrhythmia   . GI bleed   . Hypertension   . Hypertriglyceridemia 03/11/2014  . Lymphoma ([place])   . PVD (peripheral vascular disease) ([place])     Past Surgical History:  Procedure Laterality Date  . APPENDECTOMY    . PERIPHERAL VASCULAR CATHETERIZATION N/A 11/25/2016   Procedure: IVC Filter Insertion;  Surgeon: Algernon Huxley, MD;  Location: [facility];  Service: Cardiovascular;   Laterality: N/A;  . TURP VAPORIZATION      Family History  Problem Relation Age of Onset  . Diabetes Other   . Diabetes Mother     Social History:  reports that he quit smoking about 69 years ago. He has never used smokeless tobacco. He reports that he does not drink alcohol or use drugs.  He served in [employer].  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  He denies any exposure to radiation or toxins.  He has a daughter in [place], another daughter in [place], and a son in [place].  [name] (ph:  [phone]), his daughter in [place], is his medical power if he can not speak for himself.  The patient has lived at [facility] for [facility] years. He lives alone. The aide comes out 3 days a week for 2 hours and helps shop, clean the apartment, and wash clothes. On Wednesday, he gets a shower. The patient is accompanied by Clare Gandy, his daughter from [place], today.  She will be staying in town another week.  Allergies:  Allergies  Allergen Reactions  . Sulfa Antibiotics Nausea And Vomiting    Current Medications: Current Outpatient Prescriptions  Medication Sig Dispense Refill  . acidophilus (RISAQUAD) CAPS capsule Take 1 capsule by mouth 2 (two) times daily. 60 capsule 0  . allopurinol (ZYLOPRIM) 100 MG  tablet Take 1 tablet (100 mg total) by mouth daily. 30 tablet 0  . budesonide (PULMICORT) 0.5 MG/2ML nebulizer solution Take 2 mLs (0.5 mg total) by nebulization 2 (two) times daily. 120 mL 0  . cholecalciferol (VITAMIN D) 1000 units tablet Take 1,000 Units by mouth daily.    [name] Kitchen ipratropium-albuterol (DUONEB) 0.5-2.5 (3) MG/3ML SOLN Take 3 mLs by nebulization 3 (three) times daily. 360 mL 0  . latanoprost (XALATAN) 0.005 % ophthalmic solution Place 1 drop into both eyes at bedtime.    . Mesalamine (ASACOL) 400 MG CPDR DR capsule Take 2 capsules (800 mg total) by mouth 3 (three) times daily. 180 capsule 0  . metoprolol tartrate (LOPRESSOR) 25 MG tablet Take 1 tablet (25 mg  total) by mouth 2 (two) times daily. 60 tablet 0  . multivitamin-lutein (OCUVITE-LUTEIN) CAPS capsule Take 1 capsule by mouth daily.    [name] Kitchen nystatin (MYCOSTATIN) 100000 UNIT/ML suspension Take 5 mLs (500,000 Units total) by mouth 4 (four) times daily. 60 mL 0  . predniSONE (DELTASONE) 5 MG tablet Take 6 tabs po day1; take 5 tabs po day2; take 4 tabs po day3; take 3 tabs po day4; take 2 tabs po day5; take 1 tab po day6 21 tablet 0   No current facility-administered medications for this visit.     Review of Systems:  GENERAL:  Feels "pretty good".  No fevers or sweats.  Weight down 7 pounds. PERFORMANCE STATUS (ECOG):  2 HEENT:  Decreased hearing.  No visual changes, sore throat, mouth sores or tenderness. Lungs: No shortness of breath.  Cough with exertion.  Some phlegm.  No hemoptysis. Cardiac:  No chest pain, palpitations, orthopnea, or PND. GI:  No nausea, vomiting, diarrhea, constipation, or melena.  h/o blood in stool.  Last colonoscopy "a while ago". GU:  Stress urinary incontinence.  No urgency, frequency, dysuria, or hematuria.  Urinates 3x/day. Musculoskeletal:  No back pain.  No joint pain.  No muscle tenderness. Extremities:  No pain or swelling. Skin:  No rashes or skin changes. Neuro:  No headache, numbness or weakness, balance or coordination issues. Endocrine:  No diabetes, thyroid issues, hot flashes or night sweats. Psych:  No mood changes, depression or anxiety. Pain:  No focal pain. Review of systems:  All other systems reviewed and found to be negative.  Physical Exam: Blood pressure 119/66, pulse 68, temperature 97.5 F (36.4 C), temperature source Tympanic, resp. rate 18, weight 179 lb 0.2 oz (81.2 kg). GENERAL:  Well developed, well nourished, elderly gentleman sitting comfortably in a wheelchair in the exam room in no acute distress. MENTAL STATUS:  Alert and oriented to person, place and time. HEAD:  Wearing a black beret'.  Gray hair.  Scruffy facial hair.   Normocephalic, atraumatic, face symmetric, no Cushingoid features. EYES:  Blue/brown eyes.  Pupils equal round and reactive to light and accomodation.  No conjunctivitis or scleral icterus. ENT:  Oropharynx clear without lesion.  Tongue normal. Mucous membranes moist.  RESPIRATORY: Clear to auscultation without wheezes or rhonchi. CARDIOVASCULAR:  Regular rate and rhythm without murmur, rub or gallop. ABDOMEN:  Soft, non-tender, with active bowel sounds, and no appreciable hepatosplenomegaly.  No masses. SKIN:  No rashes, ulcers or lesions. EXTREMITIES: 3+ right lower extremity edema; 1+ left lower extremity edema.  No skin discoloration or tenderness.  No palpable cords. LYMPH NODES: Small axillary adenopathy.  Matted right groin adenopathy.  No palpable cervical or supraclavicular adenopathy. NEUROLOGICAL: Unremarkable.  Gait slow and metered. PSYCH:  Appropriate.  Appointment on 12/02/2016  Component Date Value Ref Range Status  . WBC 12/02/2016 11.1* 3.8 - 10.6 K/uL Final  . RBC 12/02/2016 2.76* 4.40 - 5.90 MIL/uL Final  . Hemoglobin 12/02/2016 9.1* 13.0 - 18.0 g/dL Final  . HCT 12/02/2016 27.3* 40.0 - 52.0 % Final  . MCV 12/02/2016 98.8  80.0 - 100.0 fL Final  . MCH 12/02/2016 32.8  26.0 - 34.0 pg Final  . MCHC 12/02/2016 33.2  32.0 - 36.0 g/dL Final  . RDW 12/02/2016 16.8* 11.5 - 14.5 % Final  . Platelets 12/02/2016 139* 150 - 440 K/uL Final  . Neutrophils Relative % 12/02/2016 80  % Final  . Neutro Abs 12/02/2016 8.9* 1.4 - 6.5 K/uL Final  . Lymphocytes Relative 12/02/2016 10  % Final  . Lymphs Abs 12/02/2016 1.1  1.0 - 3.6 K/uL Final  . Monocytes Relative 12/02/2016 9  % Final  . Monocytes Absolute 12/02/2016 1.0  0.2 - 1.0 K/uL Final  . Eosinophils Relative 12/02/2016 1  % Final  . Eosinophils Absolute 12/02/2016 0.1  0 - 0.7 K/uL Final  . Basophils Relative 12/02/2016 0  % Final  . Basophils Absolute 12/02/2016 0.0  0 - 0.1 K/uL Final  . Sodium 12/02/2016 141  135 - 145  mmol/L Final  . Potassium 12/02/2016 4.1  3.5 - 5.1 mmol/L Final  . Chloride 12/02/2016 113* 101 - 111 mmol/L Final  . CO2 12/02/2016 24  22 - 32 mmol/L Final  . Glucose, Bld 12/02/2016 129* 65 - 99 mg/dL Final  . BUN 12/02/2016 48* 6 - 20 mg/dL Final  . Creatinine, Ser 12/02/2016 1.30* 0.61 - 1.24 mg/dL Final  . Calcium 12/02/2016 8.3* 8.9 - 10.3 mg/dL Final  . Total Protein 12/02/2016 5.3* 6.5 - 8.1 g/dL Final  . Albumin 12/02/2016 3.0* 3.5 - 5.0 g/dL Final  . AST 12/02/2016 17  15 - 41 U/L Final  . ALT 12/02/2016 17  17 - 63 U/L Final  . Alkaline Phosphatase 12/02/2016 42  38 - 126 U/L Final  . Total Bilirubin 12/02/2016 0.8  0.3 - 1.2 mg/dL Final  . GFR calc non Af Amer 12/02/2016 44* >60 mL/min Final  . GFR calc Af Amer 12/02/2016 51* >60 mL/min Final   Comment: (NOTE) The eGFR has been calculated using the CKD EPI equation. This calculation has not been validated in all clinical situations. eGFR's persistently <60 mL/min signify possible Chronic Kidney Disease.   . Anion gap 12/02/2016 4* 5 - 15 Final    Assessment:  IZSAK MEIR is a 83 y.o. male with at least stage II follicular lymphoma.  Ultrasound guided core biopsy of a right inguinal node on 10/21/2016 revealed a low grade B cell lymphoma. The CD10 immunophenotype would favor follicular lymphoma.  Absence of discrete nodular architecture and follicular dendritic cell mesh works along with the plasmacytoid differentiation also raises the possibility of a marginal zone lymphoma.    CT renal stone study on 10/04/2016 revealed interval development of significant adenopathy of the right inguinal region, right iliac nodal chains and right side periaortic lymph nodes (prior scan 02/19/2016) . Lymph node of the right iliac nodal station measured 3.5 x 7.5 cm.  Nodal mass in the right inguinal region measured 5.2 x 7 cm.  CT angiography aortobifemoral with and without contrast on 11/04/2016 revealed right pleural effusion with right  lower lobe atelectasis. There was massive retroperitoneal, right iliac and right inguinal lymphadenopathy and venous obstruction of the right lower extremity. There was  swelling and subcutaneous edema of the right lower extremity secondary to venous obstruction. There was severe stenosis of the right renal artery, 80% or greater.  There was slower arterial flow to the left leg relative to the right without significant lower extremity arterial occlusion or compromise.  Chest CT scan on 11/14/2016 revealed no significant thoracic adenopathy.  There was persistent right pleural effusion and right lower lobe airspace consolidation.  There was upper abdominal adenopathy.  Right lower extremity duplex on 11/04/2016 revealed an acute nonocclusive deep venous thromboses in the right common femoral vein. There was a large right inguinal node measuring 8.5 x 5.8 x 4.8 cm.  He was on Xarelto.  Xarelto was discontinued secondary to hematuria and blood in stool.  Negative studies on 11/12/2016 included:  hepatitis B core antibody, hepatitis B surface antigen and hepatitis C antibody.  G6PD assay was normal on 11/12/2012.  He is on allopurinol.  He was admitted to [facility] from 11/18/2016 - 11/26/2016 with healthcare-associated pneumonia and an enterococcus faecalis UTI.  He was treated with vancomycin and Zosyn and discharged on Levaquin.  During his hospitalization, he was noted to have blood in the stool and hematuria.  An IVC filter was placed on 11/25/2016.  Xarelto was stopped.  He received Venofer x 1.    He has a normocytic anemia.  Code status:  DNR.  No intubation, CPR and cardioversion.  No dialysis.  He is currently at [facility] for rehabilitation.  Symptomatically, he has a residual cough.  He has palpable adenopathy and lower extremity edema.  Plan: 1.  Review interval hospitalization.  Discuss pneumonia and UTI.   Discuss GI and GU bleeding and IVC filter placement and decision to remain off  anticoagulation.   2.  Discuss patient's thoughts about therapy.  Discuss chlorambucil being cost prohibitive.  Discuss RCVP or Rituxan + single agnet cyclophosphamide.  Discuss single agent Rituxan.  Side effects reviewed.  Decision made to pursue single agent Rituxan.  Depending on reponse, may add cyclophosphamide if performance status improves or continue with Rituxan maintenance. 3.  Labs today:  CBC with diff, CMP, uric acid. 4.  Anemia work-up:  ferritin, iron studies, B12, folate, hold tube. 5.  Increase allopurinol to 200 mg a day. 6.  Discuss tumor lysis precautions.  Discuss good hydration and allopurinol. 7.  Week #1 Rituxan today. 8.  RTC in 2 days for labs (BMP, uric acid) 9. RTC in 1 week for MD assessment, labs (CBC with diff, CMP, uric acid, LDH), and week #2 Rituxan.   Lequita Asal, MD  12/02/2016, 10:20 AM

## 2016-12-02 ENCOUNTER — Inpatient Hospital Stay: Payer: Medicare Other

## 2016-12-02 ENCOUNTER — Inpatient Hospital Stay: Payer: Medicare Other | Attending: Hematology and Oncology

## 2016-12-02 ENCOUNTER — Inpatient Hospital Stay (HOSPITAL_BASED_OUTPATIENT_CLINIC_OR_DEPARTMENT_OTHER): Payer: Medicare Other | Admitting: Hematology and Oncology

## 2016-12-02 VITALS — BP 131/80 | HR 70 | Resp 20

## 2016-12-02 VITALS — BP 119/66 | HR 68 | Temp 97.5°F | Resp 18 | Wt 179.0 lb

## 2016-12-02 DIAGNOSIS — L03115 Cellulitis of right lower limb: Secondary | ICD-10-CM | POA: Diagnosis not present

## 2016-12-02 DIAGNOSIS — I82411 Acute embolism and thrombosis of right femoral vein: Secondary | ICD-10-CM | POA: Diagnosis not present

## 2016-12-02 DIAGNOSIS — Z79899 Other long term (current) drug therapy: Secondary | ICD-10-CM | POA: Diagnosis not present

## 2016-12-02 DIAGNOSIS — Y95 Nosocomial condition: Secondary | ICD-10-CM | POA: Diagnosis not present

## 2016-12-02 DIAGNOSIS — C8225 Follicular lymphoma grade III, unspecified, lymph nodes of inguinal region and lower limb: Secondary | ICD-10-CM | POA: Diagnosis not present

## 2016-12-02 DIAGNOSIS — D649 Anemia, unspecified: Secondary | ICD-10-CM

## 2016-12-02 DIAGNOSIS — J90 Pleural effusion, not elsewhere classified: Secondary | ICD-10-CM | POA: Insufficient documentation

## 2016-12-02 DIAGNOSIS — E781 Pure hyperglyceridemia: Secondary | ICD-10-CM | POA: Diagnosis not present

## 2016-12-02 DIAGNOSIS — K5090 Crohn's disease, unspecified, without complications: Secondary | ICD-10-CM | POA: Diagnosis not present

## 2016-12-02 DIAGNOSIS — I701 Atherosclerosis of renal artery: Secondary | ICD-10-CM | POA: Diagnosis not present

## 2016-12-02 DIAGNOSIS — L03116 Cellulitis of left lower limb: Secondary | ICD-10-CM | POA: Insufficient documentation

## 2016-12-02 DIAGNOSIS — I129 Hypertensive chronic kidney disease with stage 1 through stage 4 chronic kidney disease, or unspecified chronic kidney disease: Secondary | ICD-10-CM | POA: Insufficient documentation

## 2016-12-02 DIAGNOSIS — J181 Lobar pneumonia, unspecified organism: Secondary | ICD-10-CM

## 2016-12-02 DIAGNOSIS — C8298 Follicular lymphoma, unspecified, lymph nodes of multiple sites: Secondary | ICD-10-CM

## 2016-12-02 DIAGNOSIS — J189 Pneumonia, unspecified organism: Secondary | ICD-10-CM | POA: Diagnosis not present

## 2016-12-02 DIAGNOSIS — Z7901 Long term (current) use of anticoagulants: Secondary | ICD-10-CM | POA: Insufficient documentation

## 2016-12-02 DIAGNOSIS — E1122 Type 2 diabetes mellitus with diabetic chronic kidney disease: Secondary | ICD-10-CM | POA: Insufficient documentation

## 2016-12-02 DIAGNOSIS — I4891 Unspecified atrial fibrillation: Secondary | ICD-10-CM | POA: Insufficient documentation

## 2016-12-02 DIAGNOSIS — Z87891 Personal history of nicotine dependence: Secondary | ICD-10-CM | POA: Insufficient documentation

## 2016-12-02 DIAGNOSIS — N189 Chronic kidney disease, unspecified: Secondary | ICD-10-CM | POA: Insufficient documentation

## 2016-12-02 DIAGNOSIS — N390 Urinary tract infection, site not specified: Secondary | ICD-10-CM | POA: Diagnosis not present

## 2016-12-02 DIAGNOSIS — Z5111 Encounter for antineoplastic chemotherapy: Secondary | ICD-10-CM | POA: Diagnosis present

## 2016-12-02 DIAGNOSIS — Z5112 Encounter for antineoplastic immunotherapy: Secondary | ICD-10-CM

## 2016-12-02 DIAGNOSIS — Z66 Do not resuscitate: Secondary | ICD-10-CM | POA: Insufficient documentation

## 2016-12-02 LAB — CBC WITH DIFFERENTIAL/PLATELET
Basophils Absolute: 0 10*3/uL (ref 0–0.1)
Basophils Relative: 0 %
Eosinophils Absolute: 0.1 10*3/uL (ref 0–0.7)
Eosinophils Relative: 1 %
HCT: 27.3 % — ABNORMAL LOW (ref 40.0–52.0)
Hemoglobin: 9.1 g/dL — ABNORMAL LOW (ref 13.0–18.0)
Lymphocytes Relative: 10 %
Lymphs Abs: 1.1 10*3/uL (ref 1.0–3.6)
MCH: 32.8 pg (ref 26.0–34.0)
MCHC: 33.2 g/dL (ref 32.0–36.0)
MCV: 98.8 fL (ref 80.0–100.0)
Monocytes Absolute: 1 10*3/uL (ref 0.2–1.0)
Monocytes Relative: 9 %
Neutro Abs: 8.9 10*3/uL — ABNORMAL HIGH (ref 1.4–6.5)
Neutrophils Relative %: 80 %
Platelets: 139 10*3/uL — ABNORMAL LOW (ref 150–440)
RBC: 2.76 MIL/uL — ABNORMAL LOW (ref 4.40–5.90)
RDW: 16.8 % — ABNORMAL HIGH (ref 11.5–14.5)
WBC: 11.1 10*3/uL — ABNORMAL HIGH (ref 3.8–10.6)

## 2016-12-02 LAB — IRON AND TIBC
Iron: 55 ug/dL (ref 45–182)
Saturation Ratios: 18 % (ref 17.9–39.5)
TIBC: 310 ug/dL (ref 250–450)
UIBC: 255 ug/dL

## 2016-12-02 LAB — COMPREHENSIVE METABOLIC PANEL
ALT: 17 U/L (ref 17–63)
AST: 17 U/L (ref 15–41)
Albumin: 3 g/dL — ABNORMAL LOW (ref 3.5–5.0)
Alkaline Phosphatase: 42 U/L (ref 38–126)
Anion gap: 4 — ABNORMAL LOW (ref 5–15)
BUN: 48 mg/dL — ABNORMAL HIGH (ref 6–20)
CO2: 24 mmol/L (ref 22–32)
Calcium: 8.3 mg/dL — ABNORMAL LOW (ref 8.9–10.3)
Chloride: 113 mmol/L — ABNORMAL HIGH (ref 101–111)
Creatinine, Ser: 1.3 mg/dL — ABNORMAL HIGH (ref 0.61–1.24)
GFR calc Af Amer: 51 mL/min — ABNORMAL LOW (ref >60)
GFR calc non Af Amer: 44 mL/min — ABNORMAL LOW (ref >60)
Glucose, Bld: 129 mg/dL — ABNORMAL HIGH (ref 65–99)
Potassium: 4.1 mmol/L (ref 3.5–5.1)
Sodium: 141 mmol/L (ref 135–145)
Total Bilirubin: 0.8 mg/dL (ref 0.3–1.2)
Total Protein: 5.3 g/dL — ABNORMAL LOW (ref 6.5–8.1)

## 2016-12-02 LAB — FERRITIN: Ferritin: 77 ng/mL (ref 24–336)

## 2016-12-02 LAB — URIC ACID: Uric Acid, Serum: 4.9 mg/dL (ref 4.4–7.6)

## 2016-12-02 LAB — FOLATE: Folate: 8.5 ng/mL (ref >5.9)

## 2016-12-02 LAB — SAMPLE TO BLOOD BANK

## 2016-12-02 LAB — VITAMIN B12: Vitamin B-12: 729 pg/mL (ref 180–914)

## 2016-12-02 LAB — SEDIMENTATION RATE: Sed Rate: 21 mm/hr — ABNORMAL HIGH (ref 0–20)

## 2016-12-02 MED ORDER — DIPHENHYDRAMINE HCL 25 MG PO CAPS
25.0000 mg | ORAL_CAPSULE | Freq: Once | ORAL | Status: AC
  Administered 2016-12-02: 25 mg via ORAL
  Filled 2016-12-02: qty 1

## 2016-12-02 MED ORDER — SODIUM CHLORIDE 0.9 % IV SOLN
375.0000 mg/m2 | Freq: Once | INTRAVENOUS | Status: AC
  Administered 2016-12-02: 800 mg via INTRAVENOUS
  Filled 2016-12-02: qty 80
  Filled 2016-12-02: qty 50

## 2016-12-02 MED ORDER — SODIUM CHLORIDE 0.9 % IV SOLN
Freq: Once | INTRAVENOUS | Status: AC
  Administered 2016-12-02: 11:00:00 via INTRAVENOUS
  Filled 2016-12-02: qty 1000

## 2016-12-02 MED ORDER — ACETAMINOPHEN 325 MG PO TABS
650.0000 mg | ORAL_TABLET | Freq: Once | ORAL | Status: AC
  Administered 2016-12-02: 650 mg via ORAL
  Filled 2016-12-02: qty 2

## 2016-12-02 NOTE — Progress Notes (Signed)
Patient discharged from hospital on 11-26-16 regarding pneumonia.  Gets SOB with exertion.  Patient offers no complaints today.

## 2016-12-04 ENCOUNTER — Other Ambulatory Visit: Payer: Self-pay

## 2016-12-04 ENCOUNTER — Inpatient Hospital Stay: Payer: Medicare Other

## 2016-12-04 ENCOUNTER — Telehealth: Payer: Self-pay | Admitting: *Deleted

## 2016-12-04 DIAGNOSIS — D649 Anemia, unspecified: Secondary | ICD-10-CM

## 2016-12-04 DIAGNOSIS — Z5111 Encounter for antineoplastic chemotherapy: Secondary | ICD-10-CM | POA: Diagnosis not present

## 2016-12-04 DIAGNOSIS — C8298 Follicular lymphoma, unspecified, lymph nodes of multiple sites: Secondary | ICD-10-CM

## 2016-12-04 LAB — BASIC METABOLIC PANEL
Anion gap: 5 (ref 5–15)
BUN: 40 mg/dL — ABNORMAL HIGH (ref 6–20)
CO2: 24 mmol/L (ref 22–32)
Calcium: 8.2 mg/dL — ABNORMAL LOW (ref 8.9–10.3)
Chloride: 114 mmol/L — ABNORMAL HIGH (ref 101–111)
Creatinine, Ser: 1.39 mg/dL — ABNORMAL HIGH (ref 0.61–1.24)
GFR calc Af Amer: 47 mL/min — ABNORMAL LOW (ref >60)
GFR calc non Af Amer: 40 mL/min — ABNORMAL LOW (ref >60)
Glucose, Bld: 145 mg/dL — ABNORMAL HIGH (ref 65–99)
Potassium: 4.1 mmol/L (ref 3.5–5.1)
Sodium: 143 mmol/L (ref 135–145)

## 2016-12-04 LAB — URIC ACID: Uric Acid, Serum: 5.1 mg/dL (ref 4.4–7.6)

## 2016-12-04 NOTE — Telephone Encounter (Signed)
Called [name], patient's daughter, to inform her that her dad's labs are stable.  Grateful for call.

## 2016-12-04 NOTE — Telephone Encounter (Signed)
-----   Message from Lequita Asal, MD sent at 12/04/2016  1:44 PM EST ----- Regarding: Notify patient or daughter  Labs are stable  M  ----- Message ----- From: Interface, Lab In [place] Sent: 12/04/2016  11:38 AM To: Lequita Asal, MD

## 2016-12-05 DIAGNOSIS — B351 Tinea unguium: Secondary | ICD-10-CM | POA: Diagnosis not present

## 2016-12-08 ENCOUNTER — Other Ambulatory Visit: Payer: Self-pay | Admitting: Hematology and Oncology

## 2016-12-09 ENCOUNTER — Ambulatory Visit
Admission: RE | Admit: 2016-12-09 | Discharge: 2016-12-09 | Disposition: A | Discharge status: Home / Self Care | Payer: No Typology Code available for payment source | Source: Ambulatory Visit | Attending: Hematology and Oncology | Admitting: Hematology and Oncology

## 2016-12-09 ENCOUNTER — Inpatient Hospital Stay: Payer: Medicare Other

## 2016-12-09 ENCOUNTER — Other Ambulatory Visit: Payer: Self-pay | Admitting: Hematology and Oncology

## 2016-12-09 ENCOUNTER — Inpatient Hospital Stay (HOSPITAL_BASED_OUTPATIENT_CLINIC_OR_DEPARTMENT_OTHER): Payer: Medicare Other | Admitting: Hematology and Oncology

## 2016-12-09 ENCOUNTER — Encounter: Payer: Self-pay | Admitting: Hematology and Oncology

## 2016-12-09 VITALS — BP 110/69 | HR 85 | Temp 94.9°F | Resp 18 | Wt 191.6 lb

## 2016-12-09 DIAGNOSIS — Z5112 Encounter for antineoplastic immunotherapy: Secondary | ICD-10-CM | POA: Insufficient documentation

## 2016-12-09 DIAGNOSIS — J189 Pneumonia, unspecified organism: Secondary | ICD-10-CM

## 2016-12-09 DIAGNOSIS — C8225 Follicular lymphoma grade III, unspecified, lymph nodes of inguinal region and lower limb: Secondary | ICD-10-CM | POA: Diagnosis not present

## 2016-12-09 DIAGNOSIS — L03119 Cellulitis of unspecified part of limb: Secondary | ICD-10-CM

## 2016-12-09 DIAGNOSIS — J90 Pleural effusion, not elsewhere classified: Secondary | ICD-10-CM

## 2016-12-09 DIAGNOSIS — D649 Anemia, unspecified: Secondary | ICD-10-CM | POA: Insufficient documentation

## 2016-12-09 DIAGNOSIS — Z8701 Personal history of pneumonia (recurrent): Secondary | ICD-10-CM | POA: Insufficient documentation

## 2016-12-09 DIAGNOSIS — I701 Atherosclerosis of renal artery: Secondary | ICD-10-CM

## 2016-12-09 DIAGNOSIS — C8290 Follicular lymphoma, unspecified, unspecified site: Secondary | ICD-10-CM | POA: Insufficient documentation

## 2016-12-09 DIAGNOSIS — Z79899 Other long term (current) drug therapy: Secondary | ICD-10-CM

## 2016-12-09 DIAGNOSIS — Z5111 Encounter for antineoplastic chemotherapy: Secondary | ICD-10-CM | POA: Diagnosis not present

## 2016-12-09 DIAGNOSIS — C8298 Follicular lymphoma, unspecified, lymph nodes of multiple sites: Secondary | ICD-10-CM

## 2016-12-09 DIAGNOSIS — I700 Atherosclerosis of aorta: Secondary | ICD-10-CM | POA: Diagnosis not present

## 2016-12-09 DIAGNOSIS — Z7901 Long term (current) use of anticoagulants: Secondary | ICD-10-CM

## 2016-12-09 LAB — COMPREHENSIVE METABOLIC PANEL
ALT: 29 U/L (ref 17–63)
AST: 29 U/L (ref 15–41)
Albumin: 2.6 g/dL — ABNORMAL LOW (ref 3.5–5.0)
Alkaline Phosphatase: 53 U/L (ref 38–126)
Anion gap: 6 (ref 5–15)
BUN: 37 mg/dL — ABNORMAL HIGH (ref 6–20)
CO2: 21 mmol/L — ABNORMAL LOW (ref 22–32)
Calcium: 7.9 mg/dL — ABNORMAL LOW (ref 8.9–10.3)
Chloride: 114 mmol/L — ABNORMAL HIGH (ref 101–111)
Creatinine, Ser: 1.22 mg/dL (ref 0.61–1.24)
GFR calc Af Amer: 55 mL/min — ABNORMAL LOW (ref >60)
GFR calc non Af Amer: 47 mL/min — ABNORMAL LOW (ref >60)
Glucose, Bld: 146 mg/dL — ABNORMAL HIGH (ref 65–99)
Potassium: 4 mmol/L (ref 3.5–5.1)
Sodium: 141 mmol/L (ref 135–145)
Total Bilirubin: 0.6 mg/dL (ref 0.3–1.2)
Total Protein: 5.1 g/dL — ABNORMAL LOW (ref 6.5–8.1)

## 2016-12-09 LAB — CBC WITH DIFFERENTIAL/PLATELET
Basophils Absolute: 0.1 10*3/uL (ref 0–0.1)
Basophils Relative: 1 %
Eosinophils Absolute: 0.4 10*3/uL (ref 0–0.7)
Eosinophils Relative: 5 %
HCT: 26.3 % — ABNORMAL LOW (ref 40.0–52.0)
Hemoglobin: 8.6 g/dL — ABNORMAL LOW (ref 13.0–18.0)
Lymphocytes Relative: 9 %
Lymphs Abs: 0.6 10*3/uL — ABNORMAL LOW (ref 1.0–3.6)
MCH: 32.3 pg (ref 26.0–34.0)
MCHC: 32.9 g/dL (ref 32.0–36.0)
MCV: 98.2 fL (ref 80.0–100.0)
Monocytes Absolute: 1 10*3/uL (ref 0.2–1.0)
Monocytes Relative: 14 %
Neutro Abs: 5.3 10*3/uL (ref 1.4–6.5)
Neutrophils Relative %: 71 %
Platelets: 138 10*3/uL — ABNORMAL LOW (ref 150–440)
RBC: 2.67 MIL/uL — ABNORMAL LOW (ref 4.40–5.90)
RDW: 15.9 % — ABNORMAL HIGH (ref 11.5–14.5)
WBC: 7.4 10*3/uL (ref 3.8–10.6)

## 2016-12-09 LAB — URIC ACID: Uric Acid, Serum: 4.9 mg/dL (ref 4.4–7.6)

## 2016-12-09 LAB — LACTATE DEHYDROGENASE: LDH: 182 U/L (ref 98–192)

## 2016-12-09 MED ORDER — SODIUM CHLORIDE 0.9 % IV SOLN
Freq: Once | INTRAVENOUS | Status: AC
  Administered 2016-12-09: 11:00:00 via INTRAVENOUS
  Filled 2016-12-09: qty 1000

## 2016-12-09 MED ORDER — SODIUM CHLORIDE 0.9 % IV SOLN
375.0000 mg/m2 | Freq: Once | INTRAVENOUS | Status: AC
  Administered 2016-12-09: 800 mg via INTRAVENOUS
  Filled 2016-12-09: qty 50

## 2016-12-09 MED ORDER — ACETAMINOPHEN 325 MG PO TABS
650.0000 mg | ORAL_TABLET | Freq: Once | ORAL | Status: AC
  Administered 2016-12-09: 650 mg via ORAL
  Filled 2016-12-09: qty 2

## 2016-12-09 MED ORDER — SODIUM CHLORIDE 0.9 % IV SOLN: 375.0000 mg/m2 | Freq: Once | INTRAVENOUS | Status: DC

## 2016-12-09 MED ORDER — DIPHENHYDRAMINE HCL 25 MG PO CAPS
25.0000 mg | ORAL_CAPSULE | Freq: Once | ORAL | Status: AC
  Administered 2016-12-09: 25 mg via ORAL
  Filled 2016-12-09: qty 1

## 2016-12-09 MED ORDER — CEPHALEXIN 500 MG PO CAPS: 500.0000 mg | ORAL_CAPSULE | Freq: Three times a day (TID) | ORAL | 0 refills | Status: DC

## 2016-12-09 NOTE — Progress Notes (Signed)
[facility] day:  12/09/2016  Chief Complaint: Spencer Acosta is a 83 y.o. male with stage II bulky follicular lymphoma who is seen for assessment prior to week #2 Rituxan.  HPI:  The patient was last seen in the medical oncology on 12/02/2016.  At that time, he was recovering well from an interval hospitalization for pneumonia and an enterococcal UTI.  He had a slight residual cough.  He was at [facility] undergoing rehabilitation.  We discussed various treatment options.  Decision was made to proceed with Rituxan alone.  He underwent an anemia work-up. Hematocrit was 27.3, hemoglobin 9.1, and MCV 98.8.  Ferritin was 77.  Iron saturation was 18% and TIBC was 310.  ESR was 21.  B12 was 729.  Folate was 8.5.  He tolerated week #1 Rituxan well.  Labs on 12/04/2016 revealed a creatinine of 1.39, potassium 4.1, and uric acid 5.1.  During the interim, he has been in skilled nursing care at [facility].  He describes his cough as off and on.  He has had progressive erythema of his lower legs (left > right) over the past few days.  He denies any fever.    Past Medical History:  Diagnosis Date  . A-fib ([place])   . CKD (chronic kidney disease)   . Crohn's disease ([place])   . Diabetes mellitus without complication ([place])   . Dysrhythmia   . GI bleed   . Hypertension   . Hypertriglyceridemia 03/11/2014  . Lymphoma ([place])   . PVD (peripheral vascular disease) ([place])     Past Surgical History:  Procedure Laterality Date  . APPENDECTOMY    . PERIPHERAL VASCULAR CATHETERIZATION N/A 11/25/2016   Procedure: IVC Filter Insertion;  Surgeon: Algernon Huxley, MD;  Location: [facility];  Service: Cardiovascular;  Laterality: N/A;  . TURP VAPORIZATION      Family History  Problem Relation Age of Onset  . Diabetes Other   . Diabetes Mother     Social History:  reports that he quit smoking about 69 years ago. He has never used smokeless tobacco. He reports  that he does not drink alcohol or use drugs.  He served in [employer].  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  He denies any exposure to radiation or toxins.  He has a daughter in [place], another daughter in [place], and a son in [place].  [name] (ph:  [phone]), his daughter in [place], is his medical power if he can not speak for himself.  The patient has lived at [facility] for 18 years. He lives alone. The aide comes out 3 days a week for 2 hours and helps shop, clean the apartment, and wash clothes. On Wednesday, he gets a shower. The patient is accompanied by Clare Gandy, his daughter from [place], today.  She will be leaving soon to return to [place].  Allergies:  Allergies  Allergen Reactions  . Sulfa Antibiotics Nausea And Vomiting    Current Medications: Current Outpatient Prescriptions  Medication Sig Dispense Refill  . allopurinol (ZYLOPRIM) 100 MG tablet Take 1 tablet (100 mg total) by mouth daily. 30 tablet 0  . budesonide (PULMICORT) 0.5 MG/2ML nebulizer solution Take 2 mLs (0.5 mg total) by nebulization 2 (two) times daily. 120 mL 0  . cholecalciferol (VITAMIN D) 1000 units tablet Take 1,000 Units by mouth daily.    [name] Kitchen ipratropium-albuterol (DUONEB) 0.5-2.5 (3) MG/3ML SOLN Take  3 mLs by nebulization 3 (three) times daily. 360 mL 0  . latanoprost (XALATAN) 0.005 % ophthalmic solution Place 1 drop into both eyes at bedtime.    . Mesalamine (ASACOL) 400 MG CPDR DR capsule Take 2 capsules (800 mg total) by mouth 3 (three) times daily. 180 capsule 0  . metoprolol tartrate (LOPRESSOR) 25 MG tablet Take 1 tablet (25 mg total) by mouth 2 (two) times daily. 60 tablet 0  . multivitamin-lutein (OCUVITE-LUTEIN) CAPS capsule Take 1 capsule by mouth daily.    [name] Kitchen nystatin (MYCOSTATIN) 100000 UNIT/ML suspension Take 5 mLs (500,000 Units total) by mouth 4 (four) times daily. 60 mL 0  . acidophilus (RISAQUAD) CAPS capsule Take 1 capsule by mouth 2 (two) times daily. (Patient  not taking: Reported on 12/09/2016) 60 capsule 0  . cephALEXin (KEFLEX) 500 MG capsule Take 1 capsule (500 mg total) by mouth every 8 (eight) hours. 30 capsule 0  . predniSONE (DELTASONE) 5 MG tablet Take 6 tabs po day1; take 5 tabs po day2; take 4 tabs po day3; take 3 tabs po day4; take 2 tabs po day5; take 1 tab po day6 (Patient not taking: Reported on 12/09/2016) 21 tablet 0   No current facility-administered medications for this visit.     Review of Systems:  GENERAL:  Feels "ok".  No fevers or sweats.  Weight down 7 pounds. PERFORMANCE STATUS (ECOG):  2 HEENT:  Decreased hearing.  No visual changes, sore throat, mouth sores or tenderness. Lungs: No shortness of breath.  Cough with exertion.  Some phlegm.  No hemoptysis. Cardiac:  No chest pain, palpitations, orthopnea, or PND. GI:  No nausea, vomiting, diarrhea, constipation, or melena.  h/o blood in stool.  Last colonoscopy "a while ago". GU:  Stress urinary incontinence.  No urgency, frequency, dysuria, or hematuria.  Urinates 3x/day. Musculoskeletal:  No back pain.  No joint pain.  No muscle tenderness. Extremities:  No pain or swelling. Skin:  No rashes or skin changes. Neuro:  No headache, numbness or weakness, balance or coordination issues. Endocrine:  No diabetes, thyroid issues, hot flashes or night sweats. Psych:  No mood changes, depression or anxiety. Pain:  No focal pain. Review of systems:  All other systems reviewed and found to be negative.  Physical Exam: Blood pressure 110/69, pulse 85, temperature (!) 94.9 F (34.9 C), temperature source Tympanic, resp. rate 18, weight 191 lb 9.3 oz (86.9 kg). GENERAL:  Well developed, well nourished, elderly gentleman sitting comfortably in a wheelchair in the exam room in no acute distress. MENTAL STATUS:  Alert and oriented to person, place and time. HEAD:  Wearing a black beret'.  Gray hair.  Scruffy facial hair.  Normocephalic, atraumatic, face symmetric, no Cushingoid  features. EYES:  Blue/brown eyes.  Pupils equal round and reactive to light and accomodation.  No conjunctivitis or scleral icterus. ENT:  Oropharynx clear without lesion.  Tongue normal. Mucous membranes moist.  RESPIRATORY:  Decreased breath sounds at the bases (right > left).  Right sided wheezes.  No rales or rhonchi. CARDIOVASCULAR:  Regular rate and rhythm without murmur, rub or gallop. ABDOMEN:  Soft, non-tender, with active bowel sounds, and no appreciable hepatosplenomegaly.  No masses. SKIN:  Bilateral lower extremity erythema extending to mid tibia (left > right).  Skin is dry with cracking. EXTREMITIES: 3+ right lower extremity edema; 2+ left lower extremity edema.  No skin discoloration or tenderness.  No palpable cords. LYMPH NODES: Small axillary adenopathy.  Groin adenopathy difficult to palpate in  chair.  No palpable cervical or supraclavicular adenopathy. NEUROLOGICAL: Unremarkable. PSYCH:  Appropriate.   Appointment on 12/09/2016  Component Date Value Ref Range Status  . Sodium 12/09/2016 141  135 - 145 mmol/L Final  . Potassium 12/09/2016 4.0  3.5 - 5.1 mmol/L Final  . Chloride 12/09/2016 114* 101 - 111 mmol/L Final  . CO2 12/09/2016 21* 22 - 32 mmol/L Final  . Glucose, Bld 12/09/2016 146* 65 - 99 mg/dL Final  . BUN 12/09/2016 37* 6 - 20 mg/dL Final  . Creatinine, Ser 12/09/2016 1.22  0.61 - 1.24 mg/dL Final  . Calcium 12/09/2016 7.9* 8.9 - 10.3 mg/dL Final  . Total Protein 12/09/2016 5.1* 6.5 - 8.1 g/dL Final  . Albumin 12/09/2016 2.6* 3.5 - 5.0 g/dL Final  . AST 12/09/2016 29  15 - 41 U/L Final  . ALT 12/09/2016 29  17 - 63 U/L Final  . Alkaline Phosphatase 12/09/2016 53  38 - 126 U/L Final  . Total Bilirubin 12/09/2016 0.6  0.3 - 1.2 mg/dL Final  . GFR calc non Af Amer 12/09/2016 47* >60 mL/min Final  . GFR calc Af Amer 12/09/2016 55* >60 mL/min Final   Comment: (NOTE) The eGFR has been calculated using the CKD EPI equation. This calculation has not been  validated in all clinical situations. eGFR's persistently <60 mL/min signify possible Chronic Kidney Disease.   . Anion gap 12/09/2016 6  5 - 15 Final  . WBC 12/09/2016 7.4  3.8 - 10.6 K/uL Final  . RBC 12/09/2016 2.67* 4.40 - 5.90 MIL/uL Final  . Hemoglobin 12/09/2016 8.6* 13.0 - 18.0 g/dL Final  . HCT 12/09/2016 26.3* 40.0 - 52.0 % Final  . MCV 12/09/2016 98.2  80.0 - 100.0 fL Final  . MCH 12/09/2016 32.3  26.0 - 34.0 pg Final  . MCHC 12/09/2016 32.9  32.0 - 36.0 g/dL Final  . RDW 12/09/2016 15.9* 11.5 - 14.5 % Final  . Platelets 12/09/2016 138* 150 - 440 K/uL Final  . Neutrophils Relative % 12/09/2016 71  % Final  . Neutro Abs 12/09/2016 5.3  1.4 - 6.5 K/uL Final  . Lymphocytes Relative 12/09/2016 9  % Final  . Lymphs Abs 12/09/2016 0.6* 1.0 - 3.6 K/uL Final  . Monocytes Relative 12/09/2016 14  % Final  . Monocytes Absolute 12/09/2016 1.0  0.2 - 1.0 K/uL Final  . Eosinophils Relative 12/09/2016 5  % Final  . Eosinophils Absolute 12/09/2016 0.4  0 - 0.7 K/uL Final  . Basophils Relative 12/09/2016 1  % Final  . Basophils Absolute 12/09/2016 0.1  0 - 0.1 K/uL Final  . LDH 12/09/2016 182  98 - 192 U/L Final  . Uric Acid, Serum 12/09/2016 4.9  4.4 - 7.6 mg/dL Final    Assessment:  MENDY LAPINSKY is a 83 y.o. male with at least stage II follicular lymphoma.  Ultrasound guided core biopsy of a right inguinal node on 10/21/2016 revealed a low grade B cell lymphoma. The CD10 immunophenotype would favor follicular lymphoma.  Absence of discrete nodular architecture and follicular dendritic cell mesh works along with the plasmacytoid differentiation also raises the possibility of a marginal zone lymphoma.    CT renal stone study on 10/04/2016 revealed interval development of significant adenopathy of the right inguinal region, right iliac nodal chains and right side periaortic lymph nodes (prior scan 02/19/2016) . Lymph node of the right iliac nodal station measured 3.5 x 7.5 cm.  Nodal mass in  the right inguinal region measured 5.2 x  7 cm.  CT angiography aortobifemoral with and without contrast on 11/04/2016 revealed right pleural effusion with right lower lobe atelectasis. There was massive retroperitoneal, right iliac and right inguinal lymphadenopathy and venous obstruction of the right lower extremity. There was swelling and subcutaneous edema of the right lower extremity secondary to venous obstruction. There was severe stenosis of the right renal artery, 80% or greater.  There was slower arterial flow to the left leg relative to the right without significant lower extremity arterial occlusion or compromise.  Chest CT scan on 11/14/2016 revealed no significant thoracic adenopathy.  There was persistent right pleural effusion and right lower lobe airspace consolidation.  There was upper abdominal adenopathy.  Right lower extremity duplex on 11/04/2016 revealed an acute nonocclusive deep venous thromboses in the right common femoral vein. There was a large right inguinal node measuring 8.5 x 5.8 x 4.8 cm.  He was on Xarelto.  Xarelto was discontinued secondary to hematuria and blood in stool.  Negative studies on 11/12/2016 included:  hepatitis B core antibody, hepatitis B surface antigen and hepatitis C antibody.  G6PD assay was normal on 11/12/2012.  He is on allopurinol.  He was admitted to [facility] from 11/18/2016 - 11/26/2016 with healthcare-associated pneumonia and an enterococcus faecalis UTI.  He was treated with vancomycin and Zosyn and discharged on Levaquin.  During his hospitalization, he was noted to have blood in the stool and hematuria.  An IVC filter was placed on 11/25/2016.  Xarelto was stopped.  He received Venofer x 1.    He is s/p week #1 Rituxan (12/02/2016).  He has a normocytic anemia.  Normal labs on 12/02/2016 included:  Ferritin, iron studies, B12, and folate.  Code status:  DNR.  No intubation, CPR and cardioversion.  No dialysis.  He is currently at [facility]  for rehabilitation.  Symptomatically, he has a residual cough.  He has new bilateral lower extremity cellulitis (left > right).  Plan: 1.  Labs today:  CBC with diff, CMP, LDH, uric acid. 2.  Week #2 Rituxan. 3.  Continue allopurinol 200 mg a day. 4.  CXR (PA and lateral)- f/u pneumonia. 5.  Phone follow-up with Dr. Ola Spurr re: celulitis.  Patient will be seen in follow-up tomorrow. 6.  Rx:  Keflex 500 mg po q 8 hours. 7.  Complete paperwork for facility. 8.  RTC weekly x 2 for MD assessment, labs (CBC with diff, BMP, uric acid), and Rituxan.  Addendum:  CXR today revealed slight interval improvement in right mid lung consolidative opacities.  There was moderate right and small left pleural effusions   Lequita Asal, MD  12/09/2016

## 2016-12-09 NOTE — Progress Notes (Signed)
Patient accompanied by daughter today who states patient coughed a lot last night.  Otherwise no complaints. Bilateral lower legs are edematous and red.

## 2016-12-16 ENCOUNTER — Inpatient Hospital Stay: Payer: Medicare Other | Admitting: Hematology and Oncology

## 2016-12-16 ENCOUNTER — Inpatient Hospital Stay: Payer: Medicare Other

## 2016-12-16 ENCOUNTER — Other Ambulatory Visit: Payer: Self-pay | Admitting: Hematology and Oncology

## 2016-12-16 ENCOUNTER — Other Ambulatory Visit: Payer: Self-pay | Admitting: *Deleted

## 2016-12-16 NOTE — Progress Notes (Deleted)
[facility] day:  12/16/2016   Chief Complaint: Spencer Acosta is a 83 y.o. male with stage II bulky follicular lymphoma who is seen for assessment prior to week #3 Rituxan.  HPI:  The patient was last seen in the medical oncology on 12/09/2016.  At that time, he had new bilateral lower extremity cellulitis (left > right).  He was started on Kelflex.  He was scheduled to see Dr. Ola Spurr in follow-up.  He received week #2 Rituxan.  CXR on 12/09/2016 revealed slight interval improvement in right mid lung consolidative opacities.  There was moderate right and small left pleural effusions  He saw Dr. Ola Spurr on 12/11/2016.  He was placed in bilateral Unna boots to be changed every 3-4 days.  It was recommended that he elevate his legs as much as possible.  He was to continue Keflex.  He has a follow-up appointment in 2 weeks.  Symptomatically,   Past Medical History:  Diagnosis Date  . A-fib ([place])   . CKD (chronic kidney disease)   . Crohn's disease ([place])   . Diabetes mellitus without complication ([place])   . Dysrhythmia   . GI bleed   . Hypertension   . Hypertriglyceridemia 03/11/2014  . Lymphoma ([place])   . PVD (peripheral vascular disease) ([place])     Past Surgical History:  Procedure Laterality Date  . APPENDECTOMY    . PERIPHERAL VASCULAR CATHETERIZATION N/A 11/25/2016   Procedure: IVC Filter Insertion;  Surgeon: Algernon Huxley, MD;  Location: [facility];  Service: Cardiovascular;  Laterality: N/A;  . TURP VAPORIZATION      Family History  Problem Relation Age of Onset  . Diabetes Other   . Diabetes Mother     Social History:  reports that he quit smoking about 69 years ago. He has never used smokeless tobacco. He reports that he does not drink alcohol or use drugs.  He served in [employer].  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  He denies any exposure to radiation or toxins.  He has a  daughter in [place], another daughter in [place], and a son in [place].  [name] (ph:  [phone]), his daughter in [place], is his medical power if he can not speak for himself.  The patient has lived at [facility] for [facility] years. He lives alone. The aide comes out 3 days a week for 2 hours and helps shop, clean the apartment, and wash clothes. On Wednesday, he gets a shower. The patient is accompanied by Clare Gandy, his daughter from [place], today.  She will be leaving soon to return to [place].  Allergies:  Allergies  Allergen Reactions  . Sulfa Antibiotics Nausea And Vomiting    Current Medications: Current Outpatient Prescriptions  Medication Sig Dispense Refill  . acidophilus (RISAQUAD) CAPS capsule Take 1 capsule by mouth 2 (two) times daily. (Patient not taking: Reported on 12/09/2016) 60 capsule 0  . allopurinol (ZYLOPRIM) 100 MG tablet Take 1 tablet (100 mg total) by mouth daily. 30 tablet 0  . budesonide (PULMICORT) 0.5 MG/2ML nebulizer solution Take 2 mLs (0.5 mg total) by nebulization 2 (two) times daily. 120 mL 0  . cephALEXin (KEFLEX) 500 MG capsule Take 1 capsule (500 mg total) by mouth every 8 (eight) hours. 30 capsule 0  . cholecalciferol (VITAMIN D) 1000 units tablet Take 1,000 Units by mouth daily.    [name] Kitchen ipratropium-albuterol (DUONEB) 0.5-2.5 (3) MG/3ML  SOLN Take 3 mLs by nebulization 3 (three) times daily. 360 mL 0  . latanoprost (XALATAN) 0.005 % ophthalmic solution Place 1 drop into both eyes at bedtime.    . Mesalamine (ASACOL) 400 MG CPDR DR capsule Take 2 capsules (800 mg total) by mouth 3 (three) times daily. 180 capsule 0  . metoprolol tartrate (LOPRESSOR) 25 MG tablet Take 1 tablet (25 mg total) by mouth 2 (two) times daily. 60 tablet 0  . multivitamin-lutein (OCUVITE-LUTEIN) CAPS capsule Take 1 capsule by mouth daily.    [name] Kitchen nystatin (MYCOSTATIN) 100000 UNIT/ML suspension Take 5 mLs (500,000 Units total) by mouth 4 (four) times daily. 60 mL 0  . predniSONE (DELTASONE) 5 MG tablet Take  6 tabs po day1; take 5 tabs po day2; take 4 tabs po day3; take 3 tabs po day4; take 2 tabs po day5; take 1 tab po day6 (Patient not taking: Reported on 12/09/2016) 21 tablet 0   No current facility-administered medications for this visit.     Review of Systems:  GENERAL:  Feels "ok".  No fevers or sweats.  Weight down 7 pounds. PERFORMANCE STATUS (ECOG):  2 HEENT:  Decreased hearing.  No visual changes, sore throat, mouth sores or tenderness. Lungs: No shortness of breath.  Cough with exertion.  Some phlegm.  No hemoptysis. Cardiac:  No chest pain, palpitations, orthopnea, or PND. GI:  No nausea, vomiting, diarrhea, constipation, or melena.  h/o blood in stool.  Last colonoscopy "a while ago". GU:  Stress urinary incontinence.  No urgency, frequency, dysuria, or hematuria.  Urinates 3x/day. Musculoskeletal:  No back pain.  No joint pain.  No muscle tenderness. Extremities:  No pain or swelling. Skin:  No rashes or skin changes. Neuro:  No headache, numbness or weakness, balance or coordination issues. Endocrine:  No diabetes, thyroid issues, hot flashes or night sweats. Psych:  No mood changes, depression or anxiety. Pain:  No focal pain. Review of systems:  All other systems reviewed and found to be negative.  Physical Exam: There were no vitals taken for this visit. GENERAL:  Well developed, well nourished, elderly gentleman sitting comfortably in a wheelchair in the exam room in no acute distress. MENTAL STATUS:  Alert and oriented to person, place and time. HEAD:  Wearing a black beret'.  Gray hair.  Scruffy facial hair.  Normocephalic, atraumatic, face symmetric, no Cushingoid features. EYES:  Blue/brown eyes.  Pupils equal round and reactive to light and accomodation.  No conjunctivitis or scleral icterus. ENT:  Oropharynx clear without lesion.  Tongue normal. Mucous membranes moist.  RESPIRATORY:  Decreased breath sounds at the bases (right > left).  Right sided wheezes.  No rales  or rhonchi. CARDIOVASCULAR:  Regular rate and rhythm without murmur, rub or gallop. ABDOMEN:  Soft, non-tender, with active bowel sounds, and no appreciable hepatosplenomegaly.  No masses. SKIN:  Bilateral lower extremity erythema extending to mid tibia (left > right).  Skin is dry with cracking. EXTREMITIES: 3+ right lower extremity edema; 2+ left lower extremity edema.  No skin discoloration or tenderness.  No palpable cords. LYMPH NODES: Small axillary adenopathy.  Groin adenopathy difficult to palpate in chair.  No palpable cervical or supraclavicular adenopathy. NEUROLOGICAL: Unremarkable. PSYCH:  Appropriate.   No visits with results within 3 Day(s) from this visit.  Latest known visit with results is:  Appointment on 12/09/2016  Component Date Value Ref Range Status  . Sodium 12/09/2016 141  135 - 145 mmol/L Final  . Potassium 12/09/2016 4.0  3.5 - 5.1 mmol/L Final  . Chloride 12/09/2016 114* 101 - 111 mmol/L Final  . CO2 12/09/2016 21* 22 - 32 mmol/L Final  . Glucose, Bld 12/09/2016 146* 65 - 99 mg/dL Final  . BUN 12/09/2016 37* 6 - 20 mg/dL Final  . Creatinine, Ser 12/09/2016 1.22  0.61 - 1.24 mg/dL Final  . Calcium 12/09/2016 7.9* 8.9 - 10.3 mg/dL Final  . Total Protein 12/09/2016 5.1* 6.5 - 8.1 g/dL Final  . Albumin 12/09/2016 2.6* 3.5 - 5.0 g/dL Final  . AST 12/09/2016 29  15 - 41 U/L Final  . ALT 12/09/2016 29  17 - 63 U/L Final  . Alkaline Phosphatase 12/09/2016 53  38 - 126 U/L Final  . Total Bilirubin 12/09/2016 0.6  0.3 - 1.2 mg/dL Final  . GFR calc non Af Amer 12/09/2016 47* >60 mL/min Final  . GFR calc Af Amer 12/09/2016 55* >60 mL/min Final   Comment: (NOTE) The eGFR has been calculated using the CKD EPI equation. This calculation has not been validated in all clinical situations. eGFR's persistently <60 mL/min signify possible Chronic Kidney Disease.   . Anion gap 12/09/2016 6  5 - 15 Final  . WBC 12/09/2016 7.4  3.8 - 10.6 K/uL Final  . RBC 12/09/2016 2.67*  4.40 - 5.90 MIL/uL Final  . Hemoglobin 12/09/2016 8.6* 13.0 - 18.0 g/dL Final  . HCT 12/09/2016 26.3* 40.0 - 52.0 % Final  . MCV 12/09/2016 98.2  80.0 - 100.0 fL Final  . MCH 12/09/2016 32.3  26.0 - 34.0 pg Final  . MCHC 12/09/2016 32.9  32.0 - 36.0 g/dL Final  . RDW 12/09/2016 15.9* 11.5 - 14.5 % Final  . Platelets 12/09/2016 138* 150 - 440 K/uL Final  . Neutrophils Relative % 12/09/2016 71  % Final  . Neutro Abs 12/09/2016 5.3  1.4 - 6.5 K/uL Final  . Lymphocytes Relative 12/09/2016 9  % Final  . Lymphs Abs 12/09/2016 0.6* 1.0 - 3.6 K/uL Final  . Monocytes Relative 12/09/2016 14  % Final  . Monocytes Absolute 12/09/2016 1.0  0.2 - 1.0 K/uL Final  . Eosinophils Relative 12/09/2016 5  % Final  . Eosinophils Absolute 12/09/2016 0.4  0 - 0.7 K/uL Final  . Basophils Relative 12/09/2016 1  % Final  . Basophils Absolute 12/09/2016 0.1  0 - 0.1 K/uL Final  . LDH 12/09/2016 182  98 - 192 U/L Final  . Uric Acid, Serum 12/09/2016 4.9  4.4 - 7.6 mg/dL Final    Assessment:  Spencer Acosta is a 83 y.o. male with at least stage II follicular lymphoma.  Ultrasound guided core biopsy of a right inguinal node on 10/21/2016 revealed a low grade B cell lymphoma. The CD10 immunophenotype would favor follicular lymphoma.  Absence of discrete nodular architecture and follicular dendritic cell mesh works along with the plasmacytoid differentiation also raises the possibility of a marginal zone lymphoma.    CT renal stone study on 10/04/2016 revealed interval development of significant adenopathy of the right inguinal region, right iliac nodal chains and right side periaortic lymph nodes (prior scan 02/19/2016) . Lymph node of the right iliac nodal station measured 3.5 x 7.5 cm.  Nodal mass in the right inguinal region measured 5.2 x 7 cm.  CT angiography aortobifemoral with and without contrast on 11/04/2016 revealed right pleural effusion with right lower lobe atelectasis. There was massive retroperitoneal, right  iliac and right inguinal lymphadenopathy and venous obstruction of the right lower extremity. There was swelling  and subcutaneous edema of the right lower extremity secondary to venous obstruction. There was severe stenosis of the right renal artery, 80% or greater.  There was slower arterial flow to the left leg relative to the right without significant lower extremity arterial occlusion or compromise.  Chest CT scan on 11/14/2016 revealed no significant thoracic adenopathy.  There was persistent right pleural effusion and right lower lobe airspace consolidation.  There was upper abdominal adenopathy.  Right lower extremity duplex on 11/04/2016 revealed an acute nonocclusive deep venous thromboses in the right common femoral vein. There was a large right inguinal node measuring 8.5 x 5.8 x 4.8 cm.  He was on Xarelto.  Xarelto was discontinued secondary to hematuria and blood in stool.  Negative studies on 11/12/2016 included:  hepatitis B core antibody, hepatitis B surface antigen and hepatitis C antibody.  G6PD assay was normal on 11/12/2012.  He is on allopurinol.  He was admitted to [facility] from 11/18/2016 - 11/26/2016 with healthcare-associated pneumonia and an enterococcus faecalis UTI.  He was treated with vancomycin and Zosyn and discharged on Levaquin.  During his hospitalization, he was noted to have blood in the stool and hematuria.  An IVC filter was placed on 11/25/2016.  Xarelto was stopped.  He received Venofer x 1.    He is s/p 2 week of Rituxan (12/02/2016 - 12/09/2016).  He has a normocytic anemia.  Normal labs on 12/02/2016 included:  Ferritin, iron studies, B12, and folate.  Code status:  DNR.  No intubation, CPR and cardioversion.  No dialysis.  He is [facility] at [facility] for [facility].  Symptomatically, he has a residual cough.  He has new bilateral lower extremity cellulitis (left > right).  Plan: 1.  Labs today:  CBC with diff, BMP, uric acid. 2.  Week #3  Rituxan.  3.  Continue allopurinol 200 mg a day. 4.  CXR (PA and lateral)- f/u pneumonia. 5.  Phone follow-up with Dr. Ola Spurr re: celulitis.  Patient will be seen in follow-up tomorrow. 6.  Rx:  Keflex 500 mg po q 8 hours. 7.  Complete paperwork for facility. 8.  RTC weekly x 2 for MD assessment, labs (CBC with diff, BMP, uric acid), and Rituxan.  Addendum:  CXR today revealed slight interval improvement in right mid lung consolidative opacities.  There was moderate right and small left pleural effusions   Lequita Asal, MD  12/16/2016, 4:39 AM

## 2016-12-17 DIAGNOSIS — J918 Pleural effusion in other conditions classified elsewhere: Secondary | ICD-10-CM | POA: Diagnosis not present

## 2016-12-17 DIAGNOSIS — I482 Chronic atrial fibrillation: Secondary | ICD-10-CM | POA: Diagnosis not present

## 2016-12-17 DIAGNOSIS — E119 Type 2 diabetes mellitus without complications: Secondary | ICD-10-CM | POA: Diagnosis not present

## 2016-12-17 DIAGNOSIS — C8200 Follicular lymphoma grade I, unspecified site: Secondary | ICD-10-CM | POA: Diagnosis not present

## 2016-12-17 DIAGNOSIS — J438 Other emphysema: Secondary | ICD-10-CM

## 2016-12-18 NOTE — Progress Notes (Deleted)
[facility] day:  12/18/2016   Chief Complaint: Spencer Acosta is a 83 y.o. male with stage II bulky follicular lymphoma who is seen for assessment prior to week #3 Rituxan.  HPI:  The patient was last seen in the medical oncology on 12/09/2016.  At that time, he had new bilateral lower extremity cellulitis (left > right).  He was started on Kelflex.  He was scheduled to see Dr. Ola Spurr in follow-up.  He received week #2 Rituxan.  CXR on 12/09/2016 revealed slight interval improvement in right mid lung consolidative opacities.  There was moderate right and small left pleural effusions  He saw Dr. Ola Spurr on 12/11/2016.  He was placed in bilateral Unna boots to be changed every 3-4 days.  It was recommended that he elevate his legs as much as possible.  He was to continue Keflex.  He was scheduled for a follow-up appointment in 2 weeks.  He missed his appointment on 12/16/2016 as his daughter went back home.  [place] was not aware that his daughter was not providing transportation.  Symptomatically,   Past Medical History:  Diagnosis Date  . A-fib ([place])   . CKD (chronic kidney disease)   . Crohn's disease ([place])   . Diabetes mellitus without complication ([place])   . Dysrhythmia   . GI bleed   . Hypertension   . Hypertriglyceridemia 03/11/2014  . Lymphoma ([place])   . PVD (peripheral vascular disease) ([place])     Past Surgical History:  Procedure Laterality Date  . APPENDECTOMY    . PERIPHERAL VASCULAR CATHETERIZATION N/A 11/25/2016   Procedure: IVC Filter Insertion;  Surgeon: Algernon Huxley, MD;  Location: [facility];  Service: Cardiovascular;  Laterality: N/A;  . TURP VAPORIZATION      Family History  Problem Relation Age of Onset  . Diabetes Other   . Diabetes Mother     Social History:  reports that he quit smoking about 69 years ago. He has never used smokeless tobacco. He reports that he does not drink alcohol or use  drugs.  He served in [employer].  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  He denies any exposure to radiation or toxins.  He has a daughter in [place], another daughter in [place], and a son in [place].  [name] (ph:  [phone]), his daughter in [place], is his medical power if he can not speak for himself.  The patient has lived at [facility] for [facility] years. He lives alone. The aide comes out 3 days a week for 2 hours and helps shop, clean the apartment, and wash clothes. On Wednesday, he gets a shower. The patient is accompanied by Clare Gandy, his daughter from [place], today.  She will be leaving soon to return to [place].  Allergies:  Allergies  Allergen Reactions  . Sulfa Antibiotics Nausea And Vomiting    Current Medications: Current Outpatient Prescriptions  Medication Sig Dispense Refill  . acidophilus (RISAQUAD) CAPS capsule Take 1 capsule by mouth 2 (two) times daily. (Patient not taking: Reported on 12/09/2016) 60 capsule 0  . allopurinol (ZYLOPRIM) 100 MG tablet Take 1 tablet (100 mg total) by mouth daily. 30 tablet 0  . budesonide (PULMICORT) 0.5 MG/2ML nebulizer solution Take 2 mLs (0.5 mg total) by nebulization 2 (two) times daily. 120 mL 0  . cephALEXin (KEFLEX) 500 MG capsule Take 1 capsule (500 mg total) by mouth every 8 (  eight) hours. 30 capsule 0  . cholecalciferol (VITAMIN D) 1000 units tablet Take 1,000 Units by mouth daily.    [name] Kitchen ipratropium-albuterol (DUONEB) 0.5-2.5 (3) MG/3ML SOLN Take 3 mLs by nebulization 3 (three) times daily. 360 mL 0  . latanoprost (XALATAN) 0.005 % ophthalmic solution Place 1 drop into both eyes at bedtime.    . Mesalamine (ASACOL) 400 MG CPDR DR capsule Take 2 capsules (800 mg total) by mouth 3 (three) times daily. 180 capsule 0  . metoprolol tartrate (LOPRESSOR) 25 MG tablet Take 1 tablet (25 mg total) by mouth 2 (two) times daily. 60 tablet 0  . multivitamin-lutein (OCUVITE-LUTEIN) CAPS capsule Take 1 capsule by mouth daily.     [name] Kitchen nystatin (MYCOSTATIN) 100000 UNIT/ML suspension Take 5 mLs (500,000 Units total) by mouth 4 (four) times daily. 60 mL 0  . predniSONE (DELTASONE) 5 MG tablet Take 6 tabs po day1; take 5 tabs po day2; take 4 tabs po day3; take 3 tabs po day4; take 2 tabs po day5; take 1 tab po day6 (Patient not taking: Reported on 12/09/2016) 21 tablet 0   No current facility-administered medications for this visit.     Review of Systems:  GENERAL:  Feels "ok".  No fevers or sweats.  Weight down 7 pounds. PERFORMANCE STATUS (ECOG):  2 HEENT:  Decreased hearing.  No visual changes, sore throat, mouth sores or tenderness. Lungs: No shortness of breath.  Cough with exertion.  Some phlegm.  No hemoptysis. Cardiac:  No chest pain, palpitations, orthopnea, or PND. GI:  No nausea, vomiting, diarrhea, constipation, or melena.  h/o blood in stool.  Last colonoscopy "a while ago". GU:  Stress urinary incontinence.  No urgency, frequency, dysuria, or hematuria.  Urinates 3x/day. Musculoskeletal:  No back pain.  No joint pain.  No muscle tenderness. Extremities:  No pain or swelling. Skin:  No rashes or skin changes. Neuro:  No headache, numbness or weakness, balance or coordination issues. Endocrine:  No diabetes, thyroid issues, hot flashes or night sweats. Psych:  No mood changes, depression or anxiety. Pain:  No focal pain. Review of systems:  All other systems reviewed and found to be negative.  Physical Exam: There were no vitals taken for this visit. GENERAL:  Well developed, well nourished, elderly gentleman sitting comfortably in a wheelchair in the exam room in no acute distress. MENTAL STATUS:  Alert and oriented to person, place and time. HEAD:  Wearing a black beret'.  Gray hair.  Scruffy facial hair.  Normocephalic, atraumatic, face symmetric, no Cushingoid features. EYES:  Blue/brown eyes.  Pupils equal round and reactive to light and accomodation.  No conjunctivitis or scleral icterus. ENT:   Oropharynx clear without lesion.  Tongue normal. Mucous membranes moist.  RESPIRATORY:  Decreased breath sounds at the bases (right > left).  Right sided wheezes.  No rales or rhonchi. CARDIOVASCULAR:  Regular rate and rhythm without murmur, rub or gallop. ABDOMEN:  Soft, non-tender, with active bowel sounds, and no appreciable hepatosplenomegaly.  No masses. SKIN:  Bilateral lower extremity erythema extending to mid tibia (left > right).  Skin is dry with cracking. EXTREMITIES: 3+ right lower extremity edema; 2+ left lower extremity edema.  No skin discoloration or tenderness.  No palpable cords. LYMPH NODES: Small axillary adenopathy.  Groin adenopathy difficult to palpate in chair.  No palpable cervical or supraclavicular adenopathy. NEUROLOGICAL: Unremarkable. PSYCH:  Appropriate.   No visits with results within 3 Day(s) from this visit.  Latest known visit with results is:  Appointment on 12/09/2016  Component Date Value Ref Range Status  . Sodium 12/09/2016 141  135 - 145 mmol/L Final  . Potassium 12/09/2016 4.0  3.5 - 5.1 mmol/L Final  . Chloride 12/09/2016 114* 101 - 111 mmol/L Final  . CO2 12/09/2016 21* 22 - 32 mmol/L Final  . Glucose, Bld 12/09/2016 146* 65 - 99 mg/dL Final  . BUN 12/09/2016 37* 6 - 20 mg/dL Final  . Creatinine, Ser 12/09/2016 1.22  0.61 - 1.24 mg/dL Final  . Calcium 12/09/2016 7.9* 8.9 - 10.3 mg/dL Final  . Total Protein 12/09/2016 5.1* 6.5 - 8.1 g/dL Final  . Albumin 12/09/2016 2.6* 3.5 - 5.0 g/dL Final  . AST 12/09/2016 29  15 - 41 U/L Final  . ALT 12/09/2016 29  17 - 63 U/L Final  . Alkaline Phosphatase 12/09/2016 53  38 - 126 U/L Final  . Total Bilirubin 12/09/2016 0.6  0.3 - 1.2 mg/dL Final  . GFR calc non Af Amer 12/09/2016 47* >60 mL/min Final  . GFR calc Af Amer 12/09/2016 55* >60 mL/min Final   Comment: (NOTE) The eGFR has been calculated using the CKD EPI equation. This calculation has not been validated in all clinical situations. eGFR's  persistently <60 mL/min signify possible Chronic Kidney Disease.   . Anion gap 12/09/2016 6  5 - 15 Final  . WBC 12/09/2016 7.4  3.8 - 10.6 K/uL Final  . RBC 12/09/2016 2.67* 4.40 - 5.90 MIL/uL Final  . Hemoglobin 12/09/2016 8.6* 13.0 - 18.0 g/dL Final  . HCT 12/09/2016 26.3* 40.0 - 52.0 % Final  . MCV 12/09/2016 98.2  80.0 - 100.0 fL Final  . MCH 12/09/2016 32.3  26.0 - 34.0 pg Final  . MCHC 12/09/2016 32.9  32.0 - 36.0 g/dL Final  . RDW 12/09/2016 15.9* 11.5 - 14.5 % Final  . Platelets 12/09/2016 138* 150 - 440 K/uL Final  . Neutrophils Relative % 12/09/2016 71  % Final  . Neutro Abs 12/09/2016 5.3  1.4 - 6.5 K/uL Final  . Lymphocytes Relative 12/09/2016 9  % Final  . Lymphs Abs 12/09/2016 0.6* 1.0 - 3.6 K/uL Final  . Monocytes Relative 12/09/2016 14  % Final  . Monocytes Absolute 12/09/2016 1.0  0.2 - 1.0 K/uL Final  . Eosinophils Relative 12/09/2016 5  % Final  . Eosinophils Absolute 12/09/2016 0.4  0 - 0.7 K/uL Final  . Basophils Relative 12/09/2016 1  % Final  . Basophils Absolute 12/09/2016 0.1  0 - 0.1 K/uL Final  . LDH 12/09/2016 182  98 - 192 U/L Final  . Uric Acid, Serum 12/09/2016 4.9  4.4 - 7.6 mg/dL Final    Assessment:  Spencer Acosta is a 83 y.o. male with at least stage II follicular lymphoma.  Ultrasound guided core biopsy of a right inguinal node on 10/21/2016 revealed a low grade B cell lymphoma. The CD10 immunophenotype would favor follicular lymphoma.  Absence of discrete nodular architecture and follicular dendritic cell mesh works along with the plasmacytoid differentiation also raises the possibility of a marginal zone lymphoma.    CT renal stone study on 10/04/2016 revealed interval development of significant adenopathy of the right inguinal region, right iliac nodal chains and right side periaortic lymph nodes (prior scan 02/19/2016) . Lymph node of the right iliac nodal station measured 3.5 x 7.5 cm.  Nodal mass in the right inguinal region measured 5.2 x 7  cm.  CT angiography aortobifemoral with and without contrast on 11/04/2016 revealed right pleural  effusion with right lower lobe atelectasis. There was massive retroperitoneal, right iliac and right inguinal lymphadenopathy and venous obstruction of the right lower extremity. There was swelling and subcutaneous edema of the right lower extremity secondary to venous obstruction. There was severe stenosis of the right renal artery, 80% or greater.  There was slower arterial flow to the left leg relative to the right without significant lower extremity arterial occlusion or compromise.  Chest CT scan on 11/14/2016 revealed no significant thoracic adenopathy.  There was persistent right pleural effusion and right lower lobe airspace consolidation.  There was upper abdominal adenopathy.  Right lower extremity duplex on 11/04/2016 revealed an acute nonocclusive deep venous thromboses in the right common femoral vein. There was a large right inguinal node measuring 8.5 x 5.8 x 4.8 cm.  He was on Xarelto.  Xarelto was discontinued secondary to hematuria and blood in stool.  Negative studies on 11/12/2016 included:  hepatitis B core antibody, hepatitis B surface antigen and hepatitis C antibody.  G6PD assay was normal on 11/12/2012.  He is on allopurinol.  He was admitted to [facility] from 11/18/2016 - 11/26/2016 with healthcare-associated pneumonia and an enterococcus faecalis UTI.  He was treated with vancomycin and Zosyn and discharged on Levaquin.  During his hospitalization, he was noted to have blood in the stool and hematuria.  An IVC filter was placed on 11/25/2016.  Xarelto was stopped.  He received Venofer x 1.    He is s/p 2 week of Rituxan (12/02/2016 - 12/09/2016).  He has a normocytic anemia.  Normal labs on 12/02/2016 included:  Ferritin, iron studies, B12, and folate.  Code status:  DNR.  No intubation, CPR and cardioversion.  No dialysis.  He is currently at [facility] for  rehabilitation.  Symptomatically, he has a residual cough.  He has new bilateral lower extremity cellulitis (left > right).  Plan: 1.  Labs today:  CBC with diff, BMP, uric acid. 2.  Week #3 Rituxan. 3.  Continue allopurinol 200 mg a day.  4.  Complete paperwork for facility. 5.  RTC in 1 week for MD assessment, labs (CBC with diff, BMP, uric acid), and week #4 Rituxan.   Lequita Asal, MD  12/18/2016, 4:24 PM

## 2016-12-19 ENCOUNTER — Inpatient Hospital Stay: Payer: Medicare Other | Admitting: Hematology and Oncology

## 2016-12-19 ENCOUNTER — Inpatient Hospital Stay: Payer: Medicare Other

## 2016-12-19 ENCOUNTER — Other Ambulatory Visit: Payer: Self-pay | Admitting: *Deleted

## 2016-12-23 ENCOUNTER — Inpatient Hospital Stay: Payer: Medicare Other

## 2016-12-23 ENCOUNTER — Other Ambulatory Visit: Payer: Self-pay | Admitting: Hematology and Oncology

## 2016-12-23 ENCOUNTER — Ambulatory Visit: Payer: Medicare Other

## 2016-12-23 ENCOUNTER — Ambulatory Visit: Payer: Medicare Other | Admitting: Hematology and Oncology

## 2016-12-23 ENCOUNTER — Other Ambulatory Visit: Payer: Medicare Other

## 2016-12-23 ENCOUNTER — Inpatient Hospital Stay (HOSPITAL_BASED_OUTPATIENT_CLINIC_OR_DEPARTMENT_OTHER): Payer: Medicare Other | Admitting: Hematology and Oncology

## 2016-12-23 VITALS — BP 123/73 | HR 98 | Temp 95.5°F | Resp 18 | Wt 199.7 lb

## 2016-12-23 VITALS — BP 126/82 | HR 82

## 2016-12-23 DIAGNOSIS — I701 Atherosclerosis of renal artery: Secondary | ICD-10-CM

## 2016-12-23 DIAGNOSIS — J189 Pneumonia, unspecified organism: Secondary | ICD-10-CM

## 2016-12-23 DIAGNOSIS — Z5112 Encounter for antineoplastic immunotherapy: Secondary | ICD-10-CM

## 2016-12-23 DIAGNOSIS — L03116 Cellulitis of left lower limb: Secondary | ICD-10-CM

## 2016-12-23 DIAGNOSIS — C8298 Follicular lymphoma, unspecified, lymph nodes of multiple sites: Secondary | ICD-10-CM

## 2016-12-23 DIAGNOSIS — D649 Anemia, unspecified: Secondary | ICD-10-CM | POA: Diagnosis not present

## 2016-12-23 DIAGNOSIS — Z5111 Encounter for antineoplastic chemotherapy: Secondary | ICD-10-CM | POA: Diagnosis not present

## 2016-12-23 DIAGNOSIS — L03115 Cellulitis of right lower limb: Secondary | ICD-10-CM

## 2016-12-23 DIAGNOSIS — C8225 Follicular lymphoma grade III, unspecified, lymph nodes of inguinal region and lower limb: Secondary | ICD-10-CM

## 2016-12-23 DIAGNOSIS — Z79899 Other long term (current) drug therapy: Secondary | ICD-10-CM

## 2016-12-23 LAB — CBC WITH DIFFERENTIAL/PLATELET
Basophils Absolute: 0.1 10*3/uL (ref 0–0.1)
Basophils Relative: 1 %
Eosinophils Absolute: 0.1 10*3/uL (ref 0–0.7)
Eosinophils Relative: 2 %
HCT: 28.8 % — ABNORMAL LOW (ref 40.0–52.0)
Hemoglobin: 9.2 g/dL — ABNORMAL LOW (ref 13.0–18.0)
Lymphocytes Relative: 8 %
Lymphs Abs: 0.7 10*3/uL — ABNORMAL LOW (ref 1.0–3.6)
MCH: 30.5 pg (ref 26.0–34.0)
MCHC: 32 g/dL (ref 32.0–36.0)
MCV: 95.2 fL (ref 80.0–100.0)
Monocytes Absolute: 1.3 10*3/uL — ABNORMAL HIGH (ref 0.2–1.0)
Monocytes Relative: 16 %
Neutro Abs: 6.1 10*3/uL (ref 1.4–6.5)
Neutrophils Relative %: 73 %
Platelets: 455 10*3/uL — ABNORMAL HIGH (ref 150–440)
RBC: 3.03 MIL/uL — ABNORMAL LOW (ref 4.40–5.90)
RDW: 15.9 % — ABNORMAL HIGH (ref 11.5–14.5)
WBC: 8.3 10*3/uL (ref 3.8–10.6)

## 2016-12-23 LAB — BASIC METABOLIC PANEL
Anion gap: 5 (ref 5–15)
BUN: 37 mg/dL — ABNORMAL HIGH (ref 6–20)
CO2: 24 mmol/L (ref 22–32)
Calcium: 8 mg/dL — ABNORMAL LOW (ref 8.9–10.3)
Chloride: 114 mmol/L — ABNORMAL HIGH (ref 101–111)
Creatinine, Ser: 1.27 mg/dL — ABNORMAL HIGH (ref 0.61–1.24)
GFR calc Af Amer: 52 mL/min — ABNORMAL LOW (ref >60)
GFR calc non Af Amer: 45 mL/min — ABNORMAL LOW (ref >60)
Glucose, Bld: 106 mg/dL — ABNORMAL HIGH (ref 65–99)
Potassium: 4.2 mmol/L (ref 3.5–5.1)
Sodium: 143 mmol/L (ref 135–145)

## 2016-12-23 LAB — URIC ACID: Uric Acid, Serum: 4.5 mg/dL (ref 4.4–7.6)

## 2016-12-23 MED ORDER — ACETAMINOPHEN 325 MG PO TABS
650.0000 mg | ORAL_TABLET | Freq: Once | ORAL | Status: AC
  Administered 2016-12-23: 650 mg via ORAL
  Filled 2016-12-23: qty 2

## 2016-12-23 MED ORDER — SODIUM CHLORIDE 0.9 % IV SOLN: 375.0000 mg/m2 | Freq: Once | INTRAVENOUS | Status: DC

## 2016-12-23 MED ORDER — SODIUM CHLORIDE 0.9 % IV SOLN
375.0000 mg/m2 | Freq: Once | INTRAVENOUS | Status: AC
  Administered 2016-12-23: 800 mg via INTRAVENOUS
  Filled 2016-12-23: qty 80

## 2016-12-23 MED ORDER — SODIUM CHLORIDE 0.9 % IV SOLN
Freq: Once | INTRAVENOUS | Status: AC
  Administered 2016-12-23: 12:00:00 via INTRAVENOUS
  Filled 2016-12-23: qty 1000

## 2016-12-23 MED ORDER — DIPHENHYDRAMINE HCL 25 MG PO CAPS
25.0000 mg | ORAL_CAPSULE | Freq: Once | ORAL | Status: AC
  Administered 2016-12-23: 25 mg via ORAL
  Filled 2016-12-23: qty 1

## 2016-12-23 NOTE — Progress Notes (Signed)
[facility] day:  12/23/2016   Chief Complaint: Spencer Acosta is a 83 y.o. male with stage II bulky follicular lymphoma who is seen for assessment prior to week #3 Rituxan.  HPI:  The patient was last seen in the medical oncology on 12/09/2016.  At that time, he had new bilateral lower extremity cellulitis (left > right).  He was started on Kelflex.  He was scheduled to see Dr. Ola Spurr in follow-up.  He received week #2 Rituxan.  CXR on 12/09/2016 revealed slight interval improvement in right mid lung consolidative opacities.  There was moderate right and small left pleural effusions  He saw Dr. Ola Spurr on 12/11/2016.  He was placed in bilateral Unna boots to be changed every 3-4 days.  It was recommended that he elevate his legs as much as possible.  He was to continue Keflex.  He was scheduled for a follow-up appointment in 2 weeks.  He missed his appointment on 12/16/2016 as his daughter went back home.  [facility] was not aware that his daughter was not providing transportation.  Symptomatically, he feels "the same".  He states that his legs remain swollen.  He has shortness of breath with exertion.  He denies any fever.   Past Medical History:  Diagnosis Date  . A-fib ([place])   . CKD (chronic kidney disease)   . Crohn's disease ([place])   . Diabetes mellitus without complication ([place])   . Dysrhythmia   . GI bleed   . Hypertension   . Hypertriglyceridemia 03/11/2014  . Lymphoma ([place])   . PVD (peripheral vascular disease) ([place])     Past Surgical History:  Procedure Laterality Date  . APPENDECTOMY    . PERIPHERAL VASCULAR CATHETERIZATION N/A 11/25/2016   Procedure: IVC Filter Insertion;  Surgeon: Algernon Huxley, MD;  Location: [facility];  Service: Cardiovascular;  Laterality: N/A;  . TURP VAPORIZATION      Family History  Problem Relation Age of Onset  . Diabetes Other   . Diabetes Mother     Social History:  reports  that he quit smoking about 69 years ago. He has never used smokeless tobacco. He reports that he does not drink alcohol or use drugs.  He served in [employer].  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  He denies any exposure to radiation or toxins.  He has a daughter in [place], another daughter in [place], and a son in [place].  [name] (ph:  [phone]), his daughter in [place], is his medical power if he can not speak for himself.  The patient has lived at [facility] for [facility] years. He lives alone. The aide comes out 3 days a week for 2 hours and helps shop, clean the apartment, and wash clothes. On Wednesday, he gets a shower. The patient is accompanied by an escort from [facility].  Allergies:  Allergies  Allergen Reactions  . Sulfa Antibiotics Nausea And Vomiting    Current Medications: Current Outpatient Prescriptions  Medication Sig Dispense Refill  . acidophilus (RISAQUAD) CAPS capsule Take 1 capsule by mouth 2 (two) times daily. (Patient not taking: Reported on 12/09/2016) 60 capsule 0  . allopurinol (ZYLOPRIM) 100 MG tablet Take 1 tablet (100 mg total) by mouth daily. 30 tablet 0  . budesonide (PULMICORT) 0.5 MG/2ML nebulizer solution Take 2 mLs (0.5 mg total) by nebulization 2 (two) times daily. 120 mL 0  . cephALEXin (  KEFLEX) 500 MG capsule Take 1 capsule (500 mg total) by mouth every 8 (eight) hours. 30 capsule 0  . cholecalciferol (VITAMIN D) 1000 units tablet Take 1,000 Units by mouth daily.    [name] Kitchen ipratropium-albuterol (DUONEB) 0.5-2.5 (3) MG/3ML SOLN Take 3 mLs by nebulization 3 (three) times daily. 360 mL 0  . latanoprost (XALATAN) 0.005 % ophthalmic solution Place 1 drop into both eyes at bedtime.    . Mesalamine (ASACOL) 400 MG CPDR DR capsule Take 2 capsules (800 mg total) by mouth 3 (three) times daily. 180 capsule 0  . metoprolol tartrate (LOPRESSOR) 25 MG tablet Take 1 tablet (25 mg total) by mouth 2 (two) times daily. 60 tablet 0  .  multivitamin-lutein (OCUVITE-LUTEIN) CAPS capsule Take 1 capsule by mouth daily.    [name] Kitchen nystatin (MYCOSTATIN) 100000 UNIT/ML suspension Take 5 mLs (500,000 Units total) by mouth 4 (four) times daily. 60 mL 0  . predniSONE (DELTASONE) 5 MG tablet Take 6 tabs po day1; take 5 tabs po day2; take 4 tabs po day3; take 3 tabs po day4; take 2 tabs po day5; take 1 tab po day6 (Patient not taking: Reported on 12/09/2016) 21 tablet 0   No current facility-administered medications for this visit.     Review of Systems:  GENERAL:  Feels "the same".  No fevers or sweats.  Weight up 8 pounds. PERFORMANCE STATUS (ECOG):  2 HEENT:  Decreased hearing.  No visual changes, sore throat, mouth sores or tenderness. Lungs: Shortness of breath with exertion.  Intermittent cough.  No hemoptysis. Cardiac:  No chest pain, palpitations, orthopnea, or PND. GI:  No nausea, vomiting, diarrhea, constipation, or melena.  h/o blood in stool.  Last colonoscopy "a while ago". GU:  Stress urinary incontinence.  No urgency, frequency, dysuria, or hematuria.  Urinates 3x/day. Musculoskeletal:  No back pain.  No joint pain.  No muscle tenderness. Extremities:  Chronic lower extremity swelling. Skin:  No rashes or skin changes. Neuro:  No headache, numbness or weakness, balance or coordination issues. Endocrine:  No diabetes, thyroid issues, hot flashes or night sweats. Psych:  No mood changes, depression or anxiety. Pain:  No focal pain. Review of systems:  All other systems reviewed and found to be negative.  Physical Exam: Blood pressure 123/73, pulse 98, temperature (!) 95.5 F (35.3 C), temperature source Tympanic, resp. rate 18, weight 199 lb 11.8 oz (90.6 kg). GENERAL:  Well developed, well nourished, elderly gentleman sitting comfortably in a wheelchair in the exam room in no acute distress. MENTAL STATUS:  Alert and oriented to person, place and time. HEAD:  Wearing a black beret'.  Gray hair.  Scruffy facial hair.   Normocephalic, atraumatic, face symmetric, no Cushingoid features. EYES:  Blue/brown eyes.  Pupils equal round and reactive to light and accomodation.  No conjunctivitis or scleral icterus. ENT:  Oropharynx clear without lesion.  Tongue normal. Mucous membranes moist.  RESPIRATORY:  Decreased breath sounds at the bases.  No rales, wheezes or rhonchi. CARDIOVASCULAR:  Regular rate and rhythm without murmur, rub or gallop. ABDOMEN:  Soft, non-tender, with active bowel sounds, and no appreciable hepatosplenomegaly.  No masses. SKIN:  Bilateral lower extremity erythema extending to mid tibia (left > right).  Skin is dry with cracking. EXTREMITIES: 3+ right lower extremity edema; 2+ left lower extremity edema.  No skin discoloration or tenderness.  No palpable cords. LYMPH NODES: Small axillary adenopathy (stable).  Groin adenopathy difficult to palpate in chair.  No palpable cervical or supraclavicular adenopathy. NEUROLOGICAL:  Unremarkable. PSYCH:  Appropriate.   Appointment on 12/23/2016  Component Date Value Ref Range Status  . Sodium 12/23/2016 143  135 - 145 mmol/L Final  . Potassium 12/23/2016 4.2  3.5 - 5.1 mmol/L Final  . Chloride 12/23/2016 114* 101 - 111 mmol/L Final  . CO2 12/23/2016 24  22 - 32 mmol/L Final  . Glucose, Bld 12/23/2016 106* 65 - 99 mg/dL Final  . BUN 12/23/2016 37* 6 - 20 mg/dL Final  . Creatinine, Ser 12/23/2016 1.27* 0.61 - 1.24 mg/dL Final  . Calcium 12/23/2016 8.0* 8.9 - 10.3 mg/dL Final  . GFR calc non Af Amer 12/23/2016 45* >60 mL/min Final  . GFR calc Af Amer 12/23/2016 52* >60 mL/min Final   Comment: (NOTE) The eGFR has been calculated using the CKD EPI equation. This calculation has not been validated in all clinical situations. eGFR's persistently <60 mL/min signify possible Chronic Kidney Disease.   . Anion gap 12/23/2016 5  5 - 15 Final    Assessment:  Spencer Acosta is a 83 y.o. male with at least stage II follicular lymphoma.  Ultrasound guided  core biopsy of a right inguinal node on 10/21/2016 revealed a low grade B cell lymphoma. The CD10 immunophenotype would favor follicular lymphoma.  Absence of discrete nodular architecture and follicular dendritic cell mesh works along with the plasmacytoid differentiation also raises the possibility of a marginal zone lymphoma.    CT renal stone study on 10/04/2016 revealed interval development of significant adenopathy of the right inguinal region, right iliac nodal chains and right side periaortic lymph nodes (prior scan 02/19/2016) . Lymph node of the right iliac nodal station measured 3.5 x 7.5 cm.  Nodal mass in the right inguinal region measured 5.2 x 7 cm.  CT angiography aortobifemoral with and without contrast on 11/04/2016 revealed right pleural effusion with right lower lobe atelectasis. There was massive retroperitoneal, right iliac and right inguinal lymphadenopathy and venous obstruction of the right lower extremity. There was swelling and subcutaneous edema of the right lower extremity secondary to venous obstruction. There was severe stenosis of the right renal artery, 80% or greater.  There was slower arterial flow to the left leg relative to the right without significant lower extremity arterial occlusion or compromise.  Chest CT scan on 11/14/2016 revealed no significant thoracic adenopathy.  There was persistent right pleural effusion and right lower lobe airspace consolidation.  There was upper abdominal adenopathy.  Right lower extremity duplex on 11/04/2016 revealed an acute nonocclusive deep venous thromboses in the right common femoral vein. There was a large right inguinal node measuring 8.5 x 5.8 x 4.8 cm.  He was on Xarelto.  Xarelto was discontinued secondary to hematuria and blood in stool.  Negative studies on 11/12/2016 included:  hepatitis B core antibody, hepatitis B surface antigen and hepatitis C antibody.  G6PD assay was normal on 11/12/2012.  He is on  allopurinol.  He was admitted to [facility] from 11/18/2016 - 11/26/2016 with healthcare-associated pneumonia and an enterococcus faecalis UTI.  He was treated with vancomycin and Zosyn and discharged on Levaquin.  During his hospitalization, he was noted to have blood in the stool and hematuria.  An IVC filter was placed on 11/25/2016.  Xarelto was stopped.  He received Venofer x 1.    He is s/p 2 weeks of Rituxan (12/02/2016 - 12/09/2016).  He has a normocytic anemia.  Normal labs on 12/02/2016 included:  Ferritin, iron studies, B12, and folate.  Code status:  DNR.  No intubation, CPR and cardioversion.  No dialysis.  He is currently at [facility] for rehabilitation.  Symptomatically, he has shortness of breath with exertion.  He has chronic bilateral lower extremity edema secondary to adenopathy causing venous obstruction and thrombosis.  Plan: 1.  Labs today:  CBC with diff, BMP, uric acid. 2.  Week #3 Rituxan. 3.  Continue allopurinol 200 mg a day. 4.  Complete paperwork for facility. 5.  RTC in 1 week for MD assessment, labs (CBC with diff, BMP, uric acid), and week #4 Rituxan.   Lequita Asal, MD  12/23/2016, 11:46 AM

## 2016-12-26 ENCOUNTER — Ambulatory Visit: Payer: Medicare Other | Admitting: Hematology and Oncology

## 2016-12-26 ENCOUNTER — Ambulatory Visit: Payer: Medicare Other

## 2016-12-26 ENCOUNTER — Other Ambulatory Visit: Payer: Medicare Other

## 2016-12-29 ENCOUNTER — Other Ambulatory Visit: Payer: Self-pay | Admitting: Hematology and Oncology

## 2016-12-30 ENCOUNTER — Inpatient Hospital Stay (HOSPITAL_BASED_OUTPATIENT_CLINIC_OR_DEPARTMENT_OTHER): Payer: Medicare Other | Admitting: Hematology and Oncology

## 2016-12-30 ENCOUNTER — Inpatient Hospital Stay: Payer: Medicare Other

## 2016-12-30 ENCOUNTER — Encounter: Payer: Self-pay | Admitting: Hematology and Oncology

## 2016-12-30 VITALS — BP 134/84 | HR 111 | Temp 94.3°F | Resp 18 | Wt 208.1 lb

## 2016-12-30 DIAGNOSIS — C8298 Follicular lymphoma, unspecified, lymph nodes of multiple sites: Secondary | ICD-10-CM

## 2016-12-30 DIAGNOSIS — D649 Anemia, unspecified: Secondary | ICD-10-CM | POA: Diagnosis not present

## 2016-12-30 DIAGNOSIS — L03116 Cellulitis of left lower limb: Secondary | ICD-10-CM | POA: Diagnosis not present

## 2016-12-30 DIAGNOSIS — Z5112 Encounter for antineoplastic immunotherapy: Secondary | ICD-10-CM

## 2016-12-30 DIAGNOSIS — Z79899 Other long term (current) drug therapy: Secondary | ICD-10-CM | POA: Diagnosis not present

## 2016-12-30 DIAGNOSIS — L03115 Cellulitis of right lower limb: Secondary | ICD-10-CM

## 2016-12-30 DIAGNOSIS — C8225 Follicular lymphoma grade III, unspecified, lymph nodes of inguinal region and lower limb: Secondary | ICD-10-CM | POA: Diagnosis not present

## 2016-12-30 DIAGNOSIS — Z7901 Long term (current) use of anticoagulants: Secondary | ICD-10-CM | POA: Diagnosis not present

## 2016-12-30 DIAGNOSIS — Z5111 Encounter for antineoplastic chemotherapy: Secondary | ICD-10-CM | POA: Diagnosis not present

## 2016-12-30 DIAGNOSIS — I701 Atherosclerosis of renal artery: Secondary | ICD-10-CM

## 2016-12-30 DIAGNOSIS — J90 Pleural effusion, not elsewhere classified: Secondary | ICD-10-CM

## 2016-12-30 DIAGNOSIS — J189 Pneumonia, unspecified organism: Secondary | ICD-10-CM

## 2016-12-30 DIAGNOSIS — I82411 Acute embolism and thrombosis of right femoral vein: Secondary | ICD-10-CM | POA: Diagnosis not present

## 2016-12-30 DIAGNOSIS — I5032 Chronic diastolic (congestive) heart failure: Secondary | ICD-10-CM | POA: Diagnosis not present

## 2016-12-30 DIAGNOSIS — J9611 Chronic respiratory failure with hypoxia: Secondary | ICD-10-CM | POA: Diagnosis not present

## 2016-12-30 LAB — CBC WITH DIFFERENTIAL/PLATELET
Basophils Absolute: 0.1 10*3/uL (ref 0–0.1)
Basophils Relative: 1 %
Eosinophils Absolute: 0 10*3/uL (ref 0–0.7)
Eosinophils Relative: 0 %
HCT: 30.1 % — ABNORMAL LOW (ref 40.0–52.0)
Hemoglobin: 9.6 g/dL — ABNORMAL LOW (ref 13.0–18.0)
Lymphocytes Relative: 10 %
Lymphs Abs: 0.9 10*3/uL — ABNORMAL LOW (ref 1.0–3.6)
MCH: 29.6 pg (ref 26.0–34.0)
MCHC: 31.7 g/dL — ABNORMAL LOW (ref 32.0–36.0)
MCV: 93.2 fL (ref 80.0–100.0)
Monocytes Absolute: 1.1 10*3/uL — ABNORMAL HIGH (ref 0.2–1.0)
Monocytes Relative: 11 %
Neutro Abs: 7.6 10*3/uL — ABNORMAL HIGH (ref 1.4–6.5)
Neutrophils Relative %: 78 %
Platelets: 350 10*3/uL (ref 150–440)
RBC: 3.23 MIL/uL — ABNORMAL LOW (ref 4.40–5.90)
RDW: 16.4 % — ABNORMAL HIGH (ref 11.5–14.5)
WBC: 9.7 10*3/uL (ref 3.8–10.6)

## 2016-12-30 LAB — BASIC METABOLIC PANEL
Anion gap: 6 (ref 5–15)
BUN: 45 mg/dL — ABNORMAL HIGH (ref 6–20)
CO2: 25 mmol/L (ref 22–32)
Calcium: 8.2 mg/dL — ABNORMAL LOW (ref 8.9–10.3)
Chloride: 114 mmol/L — ABNORMAL HIGH (ref 101–111)
Creatinine, Ser: 1.28 mg/dL — ABNORMAL HIGH (ref 0.61–1.24)
GFR calc Af Amer: 52 mL/min — ABNORMAL LOW (ref >60)
GFR calc non Af Amer: 44 mL/min — ABNORMAL LOW (ref >60)
Glucose, Bld: 166 mg/dL — ABNORMAL HIGH (ref 65–99)
Potassium: 4.1 mmol/L (ref 3.5–5.1)
Sodium: 145 mmol/L (ref 135–145)

## 2016-12-30 LAB — URIC ACID: Uric Acid, Serum: 4.7 mg/dL (ref 4.4–7.6)

## 2016-12-30 MED ORDER — SODIUM CHLORIDE 0.9 % IV SOLN
Freq: Once | INTRAVENOUS | Status: AC
  Administered 2016-12-30: 11:00:00 via INTRAVENOUS
  Filled 2016-12-30: qty 1000

## 2016-12-30 MED ORDER — SODIUM CHLORIDE 0.9 % IV SOLN
375.0000 mg/m2 | Freq: Once | INTRAVENOUS | Status: AC
  Administered 2016-12-30: 800 mg via INTRAVENOUS
  Filled 2016-12-30: qty 50

## 2016-12-30 MED ORDER — DIPHENHYDRAMINE HCL 25 MG PO CAPS
25.0000 mg | ORAL_CAPSULE | Freq: Once | ORAL | Status: AC
  Administered 2016-12-30: 25 mg via ORAL
  Filled 2016-12-30: qty 1

## 2016-12-30 MED ORDER — RITUXIMAB CHEMO INJECTION 500 MG/50ML: 375.0000 mg/m2 | Freq: Once | INTRAVENOUS | Status: DC

## 2016-12-30 MED ORDER — ACETAMINOPHEN 325 MG PO TABS
650.0000 mg | ORAL_TABLET | Freq: Once | ORAL | Status: AC
  Administered 2016-12-30: 650 mg via ORAL
  Filled 2016-12-30: qty 2

## 2016-12-30 NOTE — Progress Notes (Signed)
Spencer Acosta day:  12/30/2016   Chief Complaint: Spencer Acosta is a 83 y.o. male with stage II bulky follicular lymphoma who is seen for assessment prior to week #4 Rituxan.  HPI:  The patient was last seen in the medical oncology on 12/23/2016.  At that time, he he had chronic lower extremity edema.  He had been prescribed Unna boots by Dr Ola Spurr.  He received week #3 Rituxan.  During the interim, he has felt pretty good.  He does get a little tired.  He denies any fevers or sweats.  His edema has improved with wrapping of his legs.   Past Medical History:  Diagnosis Date  . A-fib ([place])   . CKD (chronic kidney disease)   . Crohn's disease ([place])   . Diabetes mellitus without complication ([place])   . Dysrhythmia   . GI bleed   . Hypertension   . Hypertriglyceridemia 03/11/2014  . Lymphoma ([place])   . PVD (peripheral vascular disease) ([place])     Past Surgical History:  Procedure Laterality Date  . APPENDECTOMY    . PERIPHERAL VASCULAR CATHETERIZATION N/A 11/25/2016   Procedure: IVC Filter Insertion;  Surgeon: Algernon Huxley, MD;  Location: [facility];  Service: Cardiovascular;  Laterality: N/A;  . TURP VAPORIZATION      Family History  Problem Relation Age of Onset  . Diabetes Other   . Diabetes Mother     Social History:  reports that he quit smoking about 69 years ago. He has never used smokeless tobacco. He reports that he does not drink alcohol or use drugs.  He served in [place].  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  He denies any exposure to radiation or toxins.  He has a daughter in [place], another daughter in [place], and a son in [place].  Spencer Acosta (ph:  [phone]), his daughter in [place], is his medical power if he can not speak for himself.  The patient has lived at [facility] for 18 years. He lives alone. The aide comes out 3 days a week for 2 hours and helps shop, clean the  apartment, and wash clothes. On Wednesday, he gets a shower.  He lives at [facility].  The patient is accompanied by his daughter.  Allergies:  Allergies  Allergen Reactions  . Sulfa Antibiotics Nausea And Vomiting    Current Medications: Current Outpatient Prescriptions  Medication Sig Dispense Refill  . acidophilus (RISAQUAD) CAPS capsule Take 1 capsule by mouth 2 (two) times daily. (Patient not taking: Reported on 12/09/2016) 60 capsule 0  . allopurinol (ZYLOPRIM) 100 MG tablet Take 1 tablet (100 mg total) by mouth daily. 30 tablet 0  . budesonide (PULMICORT) 0.5 MG/2ML nebulizer solution Take 2 mLs (0.5 mg total) by nebulization 2 (two) times daily. 120 mL 0  . cephALEXin (KEFLEX) 500 MG capsule Take 1 capsule (500 mg total) by mouth every 8 (eight) hours. (Patient not taking: Reported on 12/23/2016) 30 capsule 0  . cholecalciferol (VITAMIN D) 1000 units tablet Take 1,000 Units by mouth daily.    [name] Kitchen ipratropium-albuterol (DUONEB) 0.5-2.5 (3) MG/3ML SOLN Take 3 mLs by nebulization 3 (three) times daily. 360 mL 0  . latanoprost (XALATAN) 0.005 % ophthalmic solution Place 1 drop into both eyes at bedtime.    . Mesalamine (ASACOL) 400 MG CPDR DR capsule Take 2 capsules (800 mg total) by mouth 3 (three) times daily.  180 capsule 0  . metoprolol tartrate (LOPRESSOR) 25 MG tablet Take 1 tablet (25 mg total) by mouth 2 (two) times daily. 60 tablet 0  . Multiple Vitamins-Minerals (PRESERVISION AREDS PO) Take by mouth.    . multivitamin-lutein (OCUVITE-LUTEIN) CAPS capsule Take 1 capsule by mouth daily.    [name] Kitchen nystatin (MYCOSTATIN) 100000 UNIT/ML suspension Take 5 mLs (500,000 Units total) by mouth 4 (four) times daily. 60 mL 0  . predniSONE (DELTASONE) 5 MG tablet Take 6 tabs po day1; take 5 tabs po day2; take 4 tabs po day3; take 3 tabs po day4; take 2 tabs po day5; take 1 tab po day6 (Patient not taking: Reported on 12/09/2016) 21 tablet 0  . Probiotic Product (ALIGN) 4 MG CAPS Take by mouth.     No  current facility-administered medications for this visit.     Review of Systems:  GENERAL:  Feels "pretty good".  Little fatigue.  No fevers or sweats.  Weight up 8 pounds. PERFORMANCE STATUS (ECOG):  2 HEENT:  Decreased hearing.  No visual changes, sore throat, mouth sores or tenderness. Lungs: Shortness of breath with exertion.  Intermittent cough.  No hemoptysis. Cardiac:  No chest pain, palpitations, orthopnea, or PND. GI:  No nausea, vomiting, diarrhea, constipation, or melena.  h/o blood in stool.  Last colonoscopy "a while ago". GU:  Stress urinary incontinence.  No urgency, frequency, dysuria, or hematuria.  Urinates 3x/day. Musculoskeletal:  No back pain.  No joint pain.  No muscle tenderness. Extremities:  Chronic lower extremity swelling. Skin:  No rashes or skin changes. Neuro:  No headache, numbness or weakness, balance or coordination issues. Endocrine:  No diabetes, thyroid issues, hot flashes or night sweats. Psych:  No mood changes, depression or anxiety. Pain:  No focal pain. Review of systems:  All other systems reviewed and found to be negative.  Physical Exam: Blood pressure 134/84, pulse 111, temperature (!) 94.3 F (34.6 C), temperature source Tympanic, resp. rate 18, weight 208 lb 1.8 oz (94.4 kg). GENERAL:  Well developed, well nourished, elderly gentleman sitting comfortably in a wheelchair in the exam room in no acute distress. MENTAL STATUS:  Alert and oriented to person, place and time. HEAD:  Wearing a black beret'.  Gray hair.  Scruffy facial hair.  Normocephalic, atraumatic, face symmetric, no Cushingoid features. EYES:  Blue/brown eyes.  Pupils equal round and reactive to light and accomodation.  No conjunctivitis or scleral icterus. ENT:  Oropharynx clear without lesion.  Tongue normal. Mucous membranes moist.  RESPIRATORY:  Decreased breath sounds at the bases.  No rales, wheezes or rhonchi. CARDIOVASCULAR:  Regular rate and rhythm without murmur, rub  or gallop. ABDOMEN:  Soft, non-tender, with active bowel sounds, and no appreciable hepatosplenomegaly.  No masses. SKIN:  Bilateral lower extremity erythema extending to mid tibia (left > right).  Skin is dry with cracking. EXTREMITIES: Legs wrapped.  No skin discoloration or tenderness.  No palpable cords. LYMPH NODES: Small axillary adenopathy (stable).  Groin adenopathy difficult to palpate in chair.  No palpable cervical or supraclavicular adenopathy. NEUROLOGICAL: Unremarkable. PSYCH:  Appropriate.   No visits with results within 3 Day(s) from this visit.  Latest known visit with results is:  Appointment on 12/23/2016  Component Date Value Ref Range Status  . WBC 12/23/2016 8.3  3.8 - 10.6 K/uL Final  . RBC 12/23/2016 3.03* 4.40 - 5.90 MIL/uL Final  . Hemoglobin 12/23/2016 9.2* 13.0 - 18.0 g/dL Final  . HCT 12/23/2016 28.8* 40.0 - 52.0 %  Final  . MCV 12/23/2016 95.2  80.0 - 100.0 fL Final  . MCH 12/23/2016 30.5  26.0 - 34.0 pg Final  . MCHC 12/23/2016 32.0  32.0 - 36.0 g/dL Final  . RDW 12/23/2016 15.9* 11.5 - 14.5 % Final  . Platelets 12/23/2016 455* 150 - 440 K/uL Final  . Neutrophils Relative % 12/23/2016 73  % Final  . Neutro Abs 12/23/2016 6.1  1.4 - 6.5 K/uL Final  . Lymphocytes Relative 12/23/2016 8  % Final  . Lymphs Abs 12/23/2016 0.7* 1.0 - 3.6 K/uL Final  . Monocytes Relative 12/23/2016 16  % Final  . Monocytes Absolute 12/23/2016 1.3* 0.2 - 1.0 K/uL Final  . Eosinophils Relative 12/23/2016 2  % Final  . Eosinophils Absolute 12/23/2016 0.1  0 - 0.7 K/uL Final  . Basophils Relative 12/23/2016 1  % Final  . Basophils Absolute 12/23/2016 0.1  0 - 0.1 K/uL Final  . Sodium 12/23/2016 143  135 - 145 mmol/L Final  . Potassium 12/23/2016 4.2  3.5 - 5.1 mmol/L Final  . Chloride 12/23/2016 114* 101 - 111 mmol/L Final  . CO2 12/23/2016 24  22 - 32 mmol/L Final  . Glucose, Bld 12/23/2016 106* 65 - 99 mg/dL Final  . BUN 12/23/2016 37* 6 - 20 mg/dL Final  . Creatinine, Ser  12/23/2016 1.27* 0.61 - 1.24 mg/dL Final  . Calcium 12/23/2016 8.0* 8.9 - 10.3 mg/dL Final  . GFR calc non Af Amer 12/23/2016 45* >60 mL/min Final  . GFR calc Af Amer 12/23/2016 52* >60 mL/min Final   Comment: (NOTE) The eGFR has been calculated using the CKD EPI equation. This calculation has not been validated in all clinical situations. eGFR's persistently <60 mL/min signify possible Chronic Kidney Disease.   . Anion gap 12/23/2016 5  5 - 15 Final  . Uric Acid, Serum 12/23/2016 4.5  4.4 - 7.6 mg/dL Final    Assessment:  SERAPHIM TROW is a 84 y.o. male with at least stage II follicular lymphoma.  Ultrasound guided core biopsy of a right inguinal node on 10/21/2016 revealed a low grade B cell lymphoma. The CD10 immunophenotype would favor follicular lymphoma.  Absence of discrete nodular architecture and follicular dendritic cell mesh works along with the plasmacytoid differentiation also raises the possibility of a marginal zone lymphoma.    CT renal stone study on 10/04/2016 revealed interval development of significant adenopathy of the right inguinal region, right iliac nodal chains and right side periaortic lymph nodes (prior scan 02/19/2016) . Lymph node of the right iliac nodal station measured 3.5 x 7.5 cm.  Nodal mass in the right inguinal region measured 5.2 x 7 cm.  CT angiography aortobifemoral with and without contrast on 11/04/2016 revealed right pleural effusion with right lower lobe atelectasis. There was massive retroperitoneal, right iliac and right inguinal lymphadenopathy and venous obstruction of the right lower extremity. There was swelling and subcutaneous edema of the right lower extremity secondary to venous obstruction. There was severe stenosis of the right renal artery, 80% or greater.  There was slower arterial flow to the left leg relative to the right without significant lower extremity arterial occlusion or compromise.  Chest CT scan on 11/14/2016 revealed no  significant thoracic adenopathy.  There was persistent right pleural effusion and right lower lobe airspace consolidation.  There was upper abdominal adenopathy.  Right lower extremity duplex on 11/04/2016 revealed an acute nonocclusive deep venous thromboses in the right common femoral vein. There was a large right inguinal  node measuring 8.5 x 5.8 x 4.8 cm.  He was on Xarelto.  Xarelto was discontinued secondary to hematuria and blood in stool.  Negative studies on 11/12/2016 included:  hepatitis B core antibody, hepatitis B surface antigen and hepatitis C antibody.  G6PD assay was normal on 11/12/2012.  He is on allopurinol.  He was admitted to [facility] from 11/18/2016 - 11/26/2016 with healthcare-associated pneumonia and an enterococcus faecalis UTI.  He was treated with vancomycin and Zosyn and discharged on Levaquin.  During his hospitalization, he was noted to have blood in the stool and hematuria.  An IVC filter was placed on 11/25/2016.  Xarelto was stopped.  He received Venofer x 1.    He is s/p 3 weeks of Rituxan (12/02/2016 - 12/23/2016).  He has a normocytic anemia.  Normal labs on 12/02/2016 included:  Ferritin, iron studies, B12, and folate.  Code status:  DNR.  No intubation, CPR and cardioversion.  No dialysis.  He is currently at [facility] for rehabilitation.  Symptomatically, he denies any B symptoms.  He describes mild fatigue.  He has chronic bilateral lower extremity edema secondary to adenopathy causing venous obstruction and thrombosis.  His legs are wrapped.  Plan: 1.  Labs today:  CBC with diff, BMP, uric acid. 2.  Week #4 Rituxan. 3.  Continue allopurinol 200 mg a day. 4.  Complete paperwork for facility. 5.  Schedule abdomen and pelvic CT scan without contrast on 01/27/2017. 6.  RTC on 01/29/2017 for MD assessment, labs (CBC with diff, BMP, uric acid), and review of CT scan.   Lequita Asal, MD  12/30/2016

## 2017-01-06 ENCOUNTER — Telehealth: Payer: Self-pay | Admitting: *Deleted

## 2017-01-06 NOTE — Telephone Encounter (Signed)
Asking if he can be around patients in nursing home and other people, he is lonely. Also asking when he will restart Rituxan treatments. Please call daughter Clare Gandy at [phone]

## 2017-01-06 NOTE — Telephone Encounter (Signed)
Advised Spencer Acosta per VO Dr Mike Gip that it is fine to be around others so long as they are not sick and  To be sure to wash his hands well

## 2017-01-07 DIAGNOSIS — E1140 Type 2 diabetes mellitus with diabetic neuropathy, unspecified: Secondary | ICD-10-CM | POA: Diagnosis not present

## 2017-01-07 DIAGNOSIS — M1000 Idiopathic gout, unspecified site: Secondary | ICD-10-CM | POA: Diagnosis not present

## 2017-01-07 DIAGNOSIS — I82409 Acute embolism and thrombosis of unspecified deep veins of unspecified lower extremity: Secondary | ICD-10-CM | POA: Diagnosis not present

## 2017-01-07 DIAGNOSIS — J90 Pleural effusion, not elsewhere classified: Secondary | ICD-10-CM | POA: Diagnosis not present

## 2017-01-07 DIAGNOSIS — I5030 Unspecified diastolic (congestive) heart failure: Secondary | ICD-10-CM

## 2017-01-07 DIAGNOSIS — J9611 Chronic respiratory failure with hypoxia: Secondary | ICD-10-CM | POA: Diagnosis not present

## 2017-01-07 DIAGNOSIS — I4891 Unspecified atrial fibrillation: Secondary | ICD-10-CM

## 2017-01-07 DIAGNOSIS — C8290 Follicular lymphoma, unspecified, unspecified site: Secondary | ICD-10-CM | POA: Diagnosis not present

## 2017-01-13 DIAGNOSIS — J189 Pneumonia, unspecified organism: Secondary | ICD-10-CM | POA: Diagnosis not present

## 2017-01-13 DIAGNOSIS — I5033 Acute on chronic diastolic (congestive) heart failure: Secondary | ICD-10-CM | POA: Diagnosis not present

## 2017-01-16 ENCOUNTER — Emergency Department: Payer: Medicare Other

## 2017-01-16 ENCOUNTER — Observation Stay
Admission: EM | Admit: 2017-01-16 | Discharge: 2017-01-18 | Disposition: A | Discharge status: Skilled Nursing Facility | Payer: Medicare Other | Attending: Internal Medicine | Admitting: Internal Medicine

## 2017-01-16 DIAGNOSIS — I130 Hypertensive heart and chronic kidney disease with heart failure and stage 1 through stage 4 chronic kidney disease, or unspecified chronic kidney disease: Principal | ICD-10-CM | POA: Insufficient documentation

## 2017-01-16 DIAGNOSIS — Z86718 Personal history of other venous thrombosis and embolism: Secondary | ICD-10-CM | POA: Diagnosis not present

## 2017-01-16 DIAGNOSIS — C8590 Non-Hodgkin lymphoma, unspecified, unspecified site: Secondary | ICD-10-CM | POA: Insufficient documentation

## 2017-01-16 DIAGNOSIS — N183 Chronic kidney disease, stage 3 (moderate): Secondary | ICD-10-CM | POA: Diagnosis not present

## 2017-01-16 DIAGNOSIS — J9610 Chronic respiratory failure, unspecified whether with hypoxia or hypercapnia: Secondary | ICD-10-CM | POA: Insufficient documentation

## 2017-01-16 DIAGNOSIS — E870 Hyperosmolality and hypernatremia: Secondary | ICD-10-CM | POA: Insufficient documentation

## 2017-01-16 DIAGNOSIS — E781 Pure hyperglyceridemia: Secondary | ICD-10-CM | POA: Insufficient documentation

## 2017-01-16 DIAGNOSIS — D649 Anemia, unspecified: Secondary | ICD-10-CM

## 2017-01-16 DIAGNOSIS — Z9981 Dependence on supplemental oxygen: Secondary | ICD-10-CM | POA: Insufficient documentation

## 2017-01-16 DIAGNOSIS — E1151 Type 2 diabetes mellitus with diabetic peripheral angiopathy without gangrene: Secondary | ICD-10-CM | POA: Insufficient documentation

## 2017-01-16 DIAGNOSIS — E1122 Type 2 diabetes mellitus with diabetic chronic kidney disease: Secondary | ICD-10-CM | POA: Insufficient documentation

## 2017-01-16 DIAGNOSIS — Z87891 Personal history of nicotine dependence: Secondary | ICD-10-CM | POA: Insufficient documentation

## 2017-01-16 DIAGNOSIS — I480 Paroxysmal atrial fibrillation: Secondary | ICD-10-CM | POA: Insufficient documentation

## 2017-01-16 DIAGNOSIS — J90 Pleural effusion, not elsewhere classified: Secondary | ICD-10-CM | POA: Diagnosis not present

## 2017-01-16 DIAGNOSIS — Z66 Do not resuscitate: Secondary | ICD-10-CM | POA: Insufficient documentation

## 2017-01-16 DIAGNOSIS — D638 Anemia in other chronic diseases classified elsewhere: Secondary | ICD-10-CM | POA: Insufficient documentation

## 2017-01-16 DIAGNOSIS — R531 Weakness: Secondary | ICD-10-CM | POA: Diagnosis not present

## 2017-01-16 DIAGNOSIS — L8990 Pressure ulcer of unspecified site, unspecified stage: Secondary | ICD-10-CM | POA: Insufficient documentation

## 2017-01-16 DIAGNOSIS — Z882 Allergy status to sulfonamides status: Secondary | ICD-10-CM | POA: Insufficient documentation

## 2017-01-16 DIAGNOSIS — K5010 Crohn's disease of large intestine without complications: Secondary | ICD-10-CM | POA: Insufficient documentation

## 2017-01-16 DIAGNOSIS — L89153 Pressure ulcer of sacral region, stage 3: Secondary | ICD-10-CM | POA: Diagnosis not present

## 2017-01-16 DIAGNOSIS — L89151 Pressure ulcer of sacral region, stage 1: Secondary | ICD-10-CM

## 2017-01-16 DIAGNOSIS — R0600 Dyspnea, unspecified: Secondary | ICD-10-CM | POA: Diagnosis present

## 2017-01-16 DIAGNOSIS — R0602 Shortness of breath: Secondary | ICD-10-CM | POA: Diagnosis present

## 2017-01-16 DIAGNOSIS — I5033 Acute on chronic diastolic (congestive) heart failure: Secondary | ICD-10-CM | POA: Insufficient documentation

## 2017-01-16 HISTORY — DX: Unspecified hearing loss, unspecified ear: H91.90

## 2017-01-16 LAB — BASIC METABOLIC PANEL
ANION GAP: 10 (ref 5–15)
Anion gap: 8 (ref 5–15)
BUN: 53 mg/dL — AB (ref 6–20)
BUN: 58 mg/dL — AB (ref 6–20)
CHLORIDE: 102 mmol/L (ref 101–111)
CHLORIDE: 103 mmol/L (ref 101–111)
CO2: 35 mmol/L — ABNORMAL HIGH (ref 22–32)
CO2: 36 mmol/L — AB (ref 22–32)
CREATININE: 1.59 mg/dL — AB (ref 0.61–1.24)
Calcium: 8.2 mg/dL — ABNORMAL LOW (ref 8.9–10.3)
Calcium: 8.3 mg/dL — ABNORMAL LOW (ref 8.9–10.3)
Creatinine, Ser: 1.49 mg/dL — ABNORMAL HIGH (ref 0.61–1.24)
GFR calc Af Amer: 43 mL/min — ABNORMAL LOW (ref >60)
GFR calc Af Amer: 40 mL/min — ABNORMAL LOW (ref >60)
GFR calc non Af Amer: 37 mL/min — ABNORMAL LOW (ref >60)
GFR calc non Af Amer: 34 mL/min — ABNORMAL LOW (ref >60)
GLUCOSE: 118 mg/dL — AB (ref 65–99)
GLUCOSE: 136 mg/dL — AB (ref 65–99)
POTASSIUM: 3.5 mmol/L (ref 3.5–5.1)
Potassium: 3.1 mmol/L — ABNORMAL LOW (ref 3.5–5.1)
SODIUM: 147 mmol/L — AB (ref 135–145)
Sodium: 147 mmol/L — ABNORMAL HIGH (ref 135–145)

## 2017-01-16 LAB — CBC
HEMATOCRIT: 24.7 % — AB (ref 40.0–52.0)
HEMATOCRIT: 25.3 % — AB (ref 40.0–52.0)
HEMOGLOBIN: 8.2 g/dL — AB (ref 13.0–18.0)
Hemoglobin: 8.1 g/dL — ABNORMAL LOW (ref 13.0–18.0)
MCH: 28.6 pg (ref 26.0–34.0)
MCH: 29.3 pg (ref 26.0–34.0)
MCHC: 32 g/dL (ref 32.0–36.0)
MCHC: 33.1 g/dL (ref 32.0–36.0)
MCV: 88.6 fL (ref 80.0–100.0)
MCV: 89.6 fL (ref 80.0–100.0)
Platelets: 206 10*3/uL (ref 150–440)
Platelets: 218 10*3/uL (ref 150–440)
RBC: 2.79 MIL/uL — ABNORMAL LOW (ref 4.40–5.90)
RBC: 2.82 MIL/uL — ABNORMAL LOW (ref 4.40–5.90)
RDW: 18.2 % — AB (ref 11.5–14.5)
RDW: 18.4 % — AB (ref 11.5–14.5)
WBC: 10.2 10*3/uL (ref 3.8–10.6)
WBC: 10.4 10*3/uL (ref 3.8–10.6)

## 2017-01-16 LAB — BRAIN NATRIURETIC PEPTIDE: B Natriuretic Peptide: 418 pg/mL — ABNORMAL HIGH (ref 0.0–100.0)

## 2017-01-16 LAB — TROPONIN I
Troponin I: 0.03 ng/mL (ref <0.03)
Troponin I: 0.03 ng/mL (ref <0.03)

## 2017-01-16 MED ORDER — IPRATROPIUM-ALBUTEROL 0.5-2.5 (3) MG/3ML IN SOLN
3.0000 mL | Freq: Three times a day (TID) | RESPIRATORY_TRACT | Status: DC
Start: 2017-01-16 — End: 2017-01-16

## 2017-01-16 MED ORDER — BUDESONIDE 0.5 MG/2ML IN SUSP
0.5000 mg | Freq: Two times a day (BID) | RESPIRATORY_TRACT | Status: DC
  Administered 2017-01-16 – 2017-01-17 (×4): 0.5 mg via RESPIRATORY_TRACT
  Administered 2017-01-18: 08:00:00 via RESPIRATORY_TRACT
  Filled 2017-01-16 (×6): qty 2

## 2017-01-16 MED ORDER — METOPROLOL TARTRATE 25 MG PO TABS: 25.0000 mg | ORAL_TABLET | Freq: Two times a day (BID) | ORAL | Status: DC

## 2017-01-16 MED ORDER — ALLOPURINOL 100 MG PO TABS
100.0000 mg | ORAL_TABLET | Freq: Every day | ORAL | Status: DC
  Administered 2017-01-16 – 2017-01-18 (×3): 100 mg via ORAL
  Filled 2017-01-16 (×3): qty 1

## 2017-01-16 MED ORDER — MESALAMINE 400 MG PO CPDR
800.0000 mg | DELAYED_RELEASE_CAPSULE | Freq: Three times a day (TID) | ORAL | Status: DC
  Administered 2017-01-16 – 2017-01-18 (×7): 800 mg via ORAL
  Filled 2017-01-16 (×7): qty 2

## 2017-01-16 MED ORDER — SODIUM CHLORIDE 0.9% FLUSH: 3.0000 mL | INTRAVENOUS | Status: DC | PRN

## 2017-01-16 MED ORDER — POTASSIUM CHLORIDE CRYS ER 20 MEQ PO TBCR
40.0000 meq | EXTENDED_RELEASE_TABLET | ORAL | Status: AC
  Administered 2017-01-16 (×3): 40 meq via ORAL
  Filled 2017-01-16 (×3): qty 2

## 2017-01-16 MED ORDER — SODIUM CHLORIDE 0.9% FLUSH
3.0000 mL | Freq: Two times a day (BID) | INTRAVENOUS | Status: DC
  Administered 2017-01-16 – 2017-01-18 (×3): 3 mL via INTRAVENOUS

## 2017-01-16 MED ORDER — IPRATROPIUM-ALBUTEROL 0.5-2.5 (3) MG/3ML IN SOLN
3.0000 mL | Freq: Three times a day (TID) | RESPIRATORY_TRACT | Status: DC
  Administered 2017-01-16 – 2017-01-18 (×6): 3 mL via RESPIRATORY_TRACT
  Filled 2017-01-16 (×9): qty 3

## 2017-01-16 MED ORDER — SENNOSIDES-DOCUSATE SODIUM 8.6-50 MG PO TABS
1.0000 | ORAL_TABLET | Freq: Every evening | ORAL | Status: DC | PRN
  Administered 2017-01-16: 18:00:00 1 via ORAL
  Filled 2017-01-16: qty 1

## 2017-01-16 MED ORDER — BUDESONIDE 0.5 MG/2ML IN SUSP
0.5000 mg | Freq: Two times a day (BID) | RESPIRATORY_TRACT | Status: DC
Start: 2017-01-16 — End: 2017-01-16

## 2017-01-16 MED ORDER — METOPROLOL TARTRATE 25 MG PO TABS
25.0000 mg | ORAL_TABLET | Freq: Two times a day (BID) | ORAL | Status: DC
  Administered 2017-01-16 – 2017-01-18 (×5): 25 mg via ORAL
  Filled 2017-01-16 (×5): qty 1

## 2017-01-16 MED ORDER — VITAMIN D 1000 UNITS PO TABS
1000.0000 [IU] | ORAL_TABLET | Freq: Every day | ORAL | Status: DC
  Administered 2017-01-16 – 2017-01-18 (×3): 1000 [IU] via ORAL
  Filled 2017-01-16 (×3): qty 1

## 2017-01-16 MED ORDER — POTASSIUM CHLORIDE CRYS ER 20 MEQ PO TBCR
20.0000 meq | EXTENDED_RELEASE_TABLET | Freq: Every day | ORAL | Status: DC
  Administered 2017-01-17 – 2017-01-18 (×2): 20 meq via ORAL
  Filled 2017-01-16 (×2): qty 1

## 2017-01-16 MED ORDER — SODIUM CHLORIDE 0.9 % IV SOLN: 250.0000 mL | INTRAVENOUS | Status: DC | PRN

## 2017-01-16 MED ORDER — OCUVITE-LUTEIN PO CAPS
1.0000 | ORAL_CAPSULE | Freq: Every day | ORAL | Status: DC
  Administered 2017-01-16 – 2017-01-18 (×3): 1 via ORAL
  Filled 2017-01-16 (×3): qty 1

## 2017-01-16 MED ORDER — FUROSEMIDE 10 MG/ML IJ SOLN
40.0000 mg | Freq: Once | INTRAMUSCULAR | Status: AC
  Administered 2017-01-16: 40 mg via INTRAVENOUS
  Filled 2017-01-16: qty 4

## 2017-01-16 MED ORDER — ACETAMINOPHEN 650 MG RE SUPP: 650.0000 mg | Freq: Four times a day (QID) | RECTAL | Status: DC | PRN

## 2017-01-16 MED ORDER — ONDANSETRON HCL 4 MG/2ML IJ SOLN: 4.0000 mg | Freq: Four times a day (QID) | INTRAMUSCULAR | Status: DC | PRN

## 2017-01-16 MED ORDER — NYSTATIN 100000 UNIT/ML MT SUSP
5.0000 mL | Freq: Four times a day (QID) | OROMUCOSAL | Status: DC
  Administered 2017-01-16: 500000 [IU] via ORAL
  Filled 2017-01-16: qty 5

## 2017-01-16 MED ORDER — LATANOPROST 0.005 % OP SOLN
1.0000 [drp] | Freq: Every day | OPHTHALMIC | Status: DC
  Administered 2017-01-16 – 2017-01-17 (×2): 1 [drp] via OPHTHALMIC
  Filled 2017-01-16: qty 2.5

## 2017-01-16 MED ORDER — ONDANSETRON HCL 4 MG PO TABS: 4.0000 mg | ORAL_TABLET | Freq: Four times a day (QID) | ORAL | Status: DC | PRN

## 2017-01-16 MED ORDER — FUROSEMIDE 10 MG/ML IJ SOLN
40.0000 mg | Freq: Two times a day (BID) | INTRAMUSCULAR | Status: DC
  Administered 2017-01-16 – 2017-01-18 (×5): 40 mg via INTRAVENOUS
  Filled 2017-01-16 (×5): qty 4

## 2017-01-16 MED ORDER — SODIUM CHLORIDE 0.9% FLUSH
3.0000 mL | Freq: Two times a day (BID) | INTRAVENOUS | Status: DC
  Administered 2017-01-16 – 2017-01-18 (×5): 3 mL via INTRAVENOUS

## 2017-01-16 MED ORDER — PREDNISONE 50 MG PO TABS
50.0000 mg | ORAL_TABLET | Freq: Every day | ORAL | Status: DC
  Administered 2017-01-16 – 2017-01-18 (×3): 50 mg via ORAL
  Filled 2017-01-16 (×3): qty 1

## 2017-01-16 MED ORDER — ACETAMINOPHEN 325 MG PO TABS: 650.0000 mg | ORAL_TABLET | Freq: Four times a day (QID) | ORAL | Status: DC | PRN

## 2017-01-16 MED ORDER — ENOXAPARIN SODIUM 30 MG/0.3ML ~~LOC~~ SOLN
30.0000 mg | Freq: Every day | SUBCUTANEOUS | Status: DC
  Administered 2017-01-16 – 2017-01-18 (×3): 30 mg via SUBCUTANEOUS
  Filled 2017-01-16 (×3): qty 0.3

## 2017-01-16 MED ORDER — RISAQUAD PO CAPS
1.0000 | ORAL_CAPSULE | Freq: Two times a day (BID) | ORAL | Status: DC
  Administered 2017-01-16 – 2017-01-18 (×5): 1 via ORAL
  Filled 2017-01-16 (×5): qty 1

## 2017-01-16 NOTE — Clinical Social Work Note (Signed)
Clinical Social Work Assessment  Patient Details  Name: Spencer Acosta MRN: [id number] Date of Birth: 1915-04-27  Date of referral:  01/16/17               Reason for consult:  Facility Placement                Permission sought to share information with:  Chartered certified accountant granted to share information::  Yes, Verbal Permission Granted  Name::      Chief Executive Officer::   [place]   Relationship::     Contact Information:     Housing/Transportation Living arrangements for the past 2 months:  [place], Charity fundraiser of Information:  Adult Children Patient Interpreter Needed:  None Criminal Activity/Legal Involvement Pertinent to Current Situation/Hospitalization:  No - Comment as needed Significant Relationships:  Adult Children Lives with:  Self, Facility Resident Do you feel safe going back to the place where you live?    Need for family participation in patient care:  Yes (Comment)  Care giving concerns:  Patient is from [facility] independent living and recently discharged from [facility].    Social Worker assessment / plan:  Holiday representative ([place]) reviewed chart and noted that patient is from [facility] independent living. CSW attempted to meet with patient however he did not answer questions approprietly. CSW contacted patient's daughter Spencer Acosta. Per daughter patient was at [facility] under private pay and then discharged back to his [place] yesterday. Per daughter patient did not do well and she would like for him to go back to [facility] under private pay. Per daughter patient can continue to pay privately for about 3 months at [facility]. CSW contacted [facility] admissions coordinator at [facility] and made her aware of above. Per Seth Bake they will accept patient back to room 225. RN will call report at [phone]. Per MD patient will be ready for D/C this weekend. CSW sent D/C Summary to  Seth Bake via [place] today. Patient's daughter Spencer Acosta is aware of above. CSW will continue to follow and assist as needed.   Employment status:  Disabled (Comment on whether or not currently receiving Disability), Retired Forensic scientist:  Medicare PT Recommendations:  Not assessed at this time Information / Referral to community resources:  [place]  Patient/Family's Response to care:  Patient's daughter prefers for patient to return to [facility].   Patient/Family's Understanding of and Emotional Response to Diagnosis, Current Treatment, and Prognosis:  Daughter was very pleasant and thanked CSW for assistance.   Emotional Assessment Appearance:  Appears stated age Attitude/Demeanor/Rapport:  Unable to Assess Affect (typically observed):  Unable to Assess Orientation:  Oriented to Self, Oriented to Place, Fluctuating Orientation (Suspected and/or reported Sundowners) Alcohol / Substance use:  Not Applicable Psych involvement (Current and /or in the community):  No (Comment)  Discharge Needs  Concerns to be addressed:  Discharge Planning Concerns Readmission within the last 30 days:  No Current discharge risk:  Dependent with Mobility Barriers to Discharge:  Continued Medical Work up   [employer], Veronia Beets, LCSW 01/16/2017, 5:09 PM

## 2017-01-16 NOTE — Clinical Social Work Placement (Signed)
   CLINICAL SOCIAL WORK PLACEMENT  NOTE  Date:  01/16/2017  Patient Details  Name: Spencer Acosta MRN: [id number] Date of Birth: 07-07-1915  Clinical Social Work is seeking post-discharge placement for this patient at the [place] level of care (*CSW will initial, date and re-position this form in  chart as items are completed):  Yes   Patient/family provided with [facility] Work Department's list of facilities offering this level of care within the geographic area requested by the patient (or if unable, by the patient's family).  Yes   Patient/family informed of their freedom to choose among providers that offer the needed level of care, that participate in Medicare, Medicaid or managed care program needed by the patient, have an available bed and are willing to accept the patient.  Yes   Patient/family informed of [facility]'s ownership interest in [facility] and [facility], as well as of the fact that they are under no obligation to receive care at these facilities.  PASRR submitted to EDS on       PASRR number received on       Existing PASRR number confirmed on 01/16/17     FL2 transmitted to all facilities in geographic area requested by pt/family on 01/16/17     FL2 transmitted to all facilities within larger geographic area on       Patient informed that his/her managed care company has contracts with or will negotiate with certain facilities, including the following:        Yes   Patient/family informed of bed offers received.  Patient chooses bed at  [facility] )     Physician recommends and patient chooses bed at      Patient to be transferred to   on  .  Patient to be transferred to facility by       Patient family notified on   of transfer.  Name of family member notified:        PHYSICIAN       Additional Comment:    _______________________________________________ Tiaja Hagan, Veronia Beets, LCSW 01/16/2017, 5:08  PM

## 2017-01-16 NOTE — Discharge Summary (Signed)
[facility] NAME: Duglas Dado    MR#:  [id number]  DATE OF BIRTH:  Apr 12, 1915  DATE OF ADMISSION:  01/16/2017 ADMITTING PHYSICIAN: Saundra Shelling, MD  DATE OF DISCHARGE: 01/17/2017  PRIMARY CARE PHYSICIAN: Kirk Ruths., MD   ADMISSION DIAGNOSIS:  Shortness of breath [R06.02] Weakness [R53.1] Decubitus ulcer of sacral region, stage 1 [L89.151] Anemia, unspecified type [D64.9]  DISCHARGE DIAGNOSIS:  Active Problems:   Dyspnea   Pressure injury of skin   SECONDARY DIAGNOSIS:   Past Medical History:  Diagnosis Date  . A-fib ([place])   . CKD (chronic kidney disease)   . Crohn's disease ([place])   . Diabetes mellitus without complication ([place])   . Dysrhythmia   . GI bleed   . HOH (hard of hearing)   . Hypertension   . Hypertriglyceridemia 03/11/2014  . Lymphoma ([place])   . PVD (peripheral vascular disease) ([place])      ADMITTING HISTORY  HISTORY OF PRESENT ILLNESS: Spencer Acosta  is a 83 y.o. male with a known history of Chronic kidney disease, chronic disease, diabetes mellitus type 2, peripheral vascular disease, lymphoma, hypertension, atrial fibrillation presented to the emergency room after patient started brought him as patient was feeling weak and rundown. Patient was treated for healthcare associated pneumonia last month at our hospital and discharged to rehabilitation center. After staying in the rehabilitation center he was discharged to home yesterday. Patient felt weak and rundown after going home and he expressed his wish to the daughter that he wants to again go back to rehabilitation center. As patient was already discharged from rehabilitation center patient's daughter brought him to the emergency room. Patient also complained of shortness of breath at home after discharge from the rehabilitation center. No complaints of any cough, fever and chills. He was evaluated in the emergency room and he was found to have some  congestion in the lungs. His chest x-ray showed some pleural effusions. He patient has been on oxygen since last admission. He is comfortable on oxygen via nasal cannula. No complaints of any chest pain. Patient's daughter unable to take care of him at home and wants him again back in the rehabilitation facility .Hospitalist service was consulted for further care of the patient.  HOSPITAL COURSE:    * Acute on chronic diastolic CHF with bilateral pleural effusions Treated with IV Lasix along with potassium supplementation.  He does have chronic bilateral pleural effusion. Chronic shortness of breath. Continue oral Lasix at 80 mg twice a day. Will need close follow-up at rehabilitation.  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 He follows up at the cancer center with Dr. Mike Gip.  * CKD stage III is stable  * Chronic respiratory failure on 2 L oxygen  * Weakness. He'll need physical therapy at rehabilitation.  * Paroxysmal atrial fibrillation. On metoprolol. No anticoagulation due to history of GI bleed.    CONSULTS OBTAINED:    DRUG ALLERGIES:   Allergies  Allergen Reactions  . Sulfa Antibiotics Nausea And Vomiting    DISCHARGE MEDICATIONS:   Current Discharge Medication List    CONTINUE these medications which have NOT CHANGED   Details  allopurinol (ZYLOPRIM) 100 MG tablet Take 1 tablet (100 mg total) by mouth daily. Qty: 30 tablet, Refills: 0    budesonide (PULMICORT) 0.5 MG/2ML nebulizer solution Take 2 mLs (0.5 mg total) by nebulization 2 (two) times daily. Qty: 120 mL, Refills: 0  cholecalciferol (VITAMIN D) 1000 units tablet Take 1,000 Units by mouth daily.    furosemide (LASIX) 80 MG tablet Take 80 mg by mouth 2 (two) times daily.    ipratropium-albuterol (DUONEB) 0.5-2.5 (3) MG/3ML SOLN Take 3 mLs by nebulization 3 (three) times daily. Qty: 360 mL, Refills: 0    latanoprost (XALATAN) 0.005 % ophthalmic solution Place 1  drop into both eyes at bedtime.    Mesalamine (ASACOL) 400 MG CPDR DR capsule Take 2 capsules (800 mg total) by mouth 3 (three) times daily. Qty: 180 capsule, Refills: 0    metoprolol tartrate (LOPRESSOR) 25 MG tablet Take 1 tablet (25 mg total) by mouth 2 (two) times daily. Qty: 60 tablet, Refills: 0    !! Multiple Vitamins-Minerals (PRESERVISION AREDS PO) Take by mouth.    !! multivitamin-lutein (OCUVITE-LUTEIN) CAPS capsule Take 1 capsule by mouth daily.    potassium chloride SA (K-DUR,KLOR-CON) 20 MEQ tablet Take 20 mEq by mouth daily.    !! Probiotic Product (ALIGN) 4 MG CAPS Take by mouth.    !! acidophilus (RISAQUAD) CAPS capsule Take 1 capsule by mouth 2 (two) times daily. Qty: 60 capsule, Refills: 0    nystatin (MYCOSTATIN) 100000 UNIT/ML suspension Take 5 mLs (500,000 Units total) by mouth 4 (four) times daily. Qty: 60 mL, Refills: 0    predniSONE (DELTASONE) 5 MG tablet Take 6 tabs po day1; take 5 tabs po day2; take 4 tabs po day3; take 3 tabs po day4; take 2 tabs po day5; take 1 tab po day6 Qty: 21 tablet, Refills: 0     !! - Potential duplicate medications found. Please discuss with provider.    STOP taking these medications     azithromycin (ZITHROMAX) 250 MG tablet      cephALEXin (KEFLEX) 500 MG capsule         Today   VITAL SIGNS:  Blood pressure 99/61, pulse 89, temperature 98.7 F (37.1 C), resp. rate 20, height 5\' 8"  (1.727 m), weight 81.2 kg (179 lb 1.6 oz), SpO2 96 %.  I/O:   Intake/Output Summary (Last 24 hours) at 01/16/17 1525 Last data filed at 01/16/17 0900  Gross per 24 hour  Intake              120 ml  Output                0 ml  Net              120 ml    PHYSICAL EXAMINATION:  Physical Exam  GENERAL:  83 y.o.-year-old patient lying in the bed with no acute distress. Decreased hearing  LUNGS: Normal breath sounds bilaterally, no wheezing, rales,rhonchi or crepitation. No use of accessory muscles of respiration.  CARDIOVASCULAR:  S1, S2 normal. No murmurs, rubs, or gallops.  ABDOMEN: Soft, non-tender, non-distended. Bowel sounds present. No organomegaly or mass.  NEUROLOGIC: Moves all 4 extremities. PSYCHIATRIC: The patient is alert and awake  DATA REVIEW:   CBC  Recent Labs Lab 01/16/17 0553  WBC 10.2  HGB 8.1*  HCT 25.3*  PLT 206    Chemistries   Recent Labs Lab 01/16/17 0553  NA 147*  K 3.1*  CL 103  CO2 36*  GLUCOSE 118*  BUN 53*  CREATININE 1.59*  CALCIUM 8.3*    Cardiac Enzymes  Recent Labs Lab 01/16/17 0553  TROPONINI 0.03*    Microbiology Results  Results for orders placed or performed during the hospital encounter of 11/18/16  Culture, blood (Routine x  2)     Status: None   Collection Time: 11/18/16  8:32 AM  Result Value Ref Range Status   Specimen Description BLOOD RIGHT FOREARM  Final   Special Requests   Final    BOTTLES DRAWN AEROBIC AND ANAEROBIC AER 10ML ANA 11ML   Culture NO GROWTH 5 DAYS  Final   Report Status 11/23/2016 FINAL  Final  Culture, blood (Routine x 2)     Status: None   Collection Time: 11/18/16  8:32 AM  Result Value Ref Range Status   Specimen Description BLOOD LEFT AC  Final   Special Requests   Final    BOTTLES DRAWN AEROBIC AND ANAEROBIC AER 10ML ANA 13ML   Culture NO GROWTH 5 DAYS  Final   Report Status 11/23/2016 FINAL  Final  Urine culture     Status: Abnormal   Collection Time: 11/18/16  9:54 AM  Result Value Ref Range Status   Specimen Description URINE, RANDOM  Final   Special Requests NONE  Final   Culture >=100,000 COLONIES/mL ENTEROCOCCUS FAECALIS (A)  Final   Report Status 11/22/2016 FINAL  Final   Organism ID, Bacteria ENTEROCOCCUS FAECALIS (A)  Final      Susceptibility   Enterococcus faecalis - MIC*    AMPICILLIN <=2 SENSITIVE Sensitive     LEVOFLOXACIN 0.5 SENSITIVE Sensitive     NITROFURANTOIN <=16 SENSITIVE Sensitive     VANCOMYCIN 4 SENSITIVE Sensitive     * >=100,000 COLONIES/mL ENTEROCOCCUS FAECALIS    RADIOLOGY:   Dg Chest 2 View  Result Date: 01/16/2017 CLINICAL DATA:  Recent discharge from hospital. Now increased shortness of breath. History of CHF and lymphoma. EXAM: CHEST  2 VIEW COMPARISON:  12/09/2016 FINDINGS: Shallow inspiration. Small to moderate bilateral pleural effusions with atelectasis or infiltration in the lung bases. This is progressing since previous study. Cardiac silhouette is somewhat obscured by the parenchymal process but heart size appears enlarged. No vascular congestion. No pneumothorax. Tortuous aorta. Degenerative changes in the spine. IMPRESSION: Small to moderate bilateral pleural effusions with basilar atelectasis or consolidation bilaterally. Cardiac enlargement. Changes are progressing since previous study. Electronically Signed   By: Lucienne Capers M.D.   On: 01/16/2017 01:39    Follow up with PCP in 1 week.  Management plans discussed with the patient, family and they are in agreement.  CODE STATUS:     Code Status Orders        Start     Ordered   01/16/17 0532  Do not attempt resuscitation (DNR)  Continuous    Question Answer Comment  In the event of cardiac or respiratory ARREST Do not call a "code blue"   In the event of cardiac or respiratory ARREST Do not perform Intubation, CPR, defibrillation or ACLS   In the event of cardiac or respiratory ARREST Use medication by any route, position, wound care, and other measures to relive pain and suffering. May use oxygen, suction and manual treatment of airway obstruction as needed for comfort.      01/16/17 0531    Code Status History    Date Active Date Inactive Code Status Order ID Comments User Context   11/18/2016 11:38 AM 11/26/2016  4:34 PM DNR [id number]  Henreitta Leber, MD Inpatient   11/04/2016 11:26 PM 11/06/2016  2:47 PM DNR [id number]  Lance Coon, MD Inpatient   10/04/2016  8:29 PM 10/05/2016 12:55 AM DNR [id number]  Hinda Kehr, MD ED   02/19/2016  9:28 PM 02/22/2016  5:43  PM DNR [id number]  Gladstone Lighter, MD Inpatient    Advance Directive Documentation   Flowsheet Row Most Recent Value  Type of Advance Directive  Healthcare Power of Attorney, Living will  Pre-existing out of facility DNR order (yellow form or pink MOST form)  No data  "MOST" Form in Place?  No data      TOTAL TIME TAKING CARE OF THIS PATIENT ON DAY OF DISCHARGE: more than 30 minutes.   Hillary Bow R M.D on 01/16/2017 at 3:25 PM  Between 7am to 6pm - Pager - [phone]  After 6pm go to [email or web address] - password [email or web address] [facility]  Office  [phone]  CC: Primary care physician; Kirk Ruths., MD  Note: This dictation was prepared with Dragon dictation along with smaller phrase technology. Any transcriptional errors that result from this process are unintentional.

## 2017-01-16 NOTE — Consult Note (Signed)
[facility] Nurse wound consult note Reason for Consult: Bilateral compression to lower extremities.  Stage 3 pressure injury to left buttocks, present on admission.  Wound type:chronic edema  Stage 3 pressure and moisture Pressure Injury POA: Yes Measurement:Left buttocks 2 cm x 1.5 cm x 0.2 cm  Lower legs intact with generalized edema. Wound [id number] and moist Drainage (amount, consistency, odor) scant serous weeping Periwound:intact Dressing procedure/placement/frequency:Cleanse buttocks wound with NS.  Apply calcium alginate to wound bed.  Cover with silicone border foam dressing.  Change Mon/Wed/Fri and PRN soilage.   Cleanse bilateral lower legs with soap and water.  Wrap with zinc layer from toes to below knees.  Secure with [place].  Change twice weekly on Tuesday and Friday.  Will not follow at this time.  Please re-consult if needed.  Domenic Moras RN BSN [place] Pager [phone]

## 2017-01-16 NOTE — NC FL2 (Signed)
[place] LEVEL OF CARE SCREENING TOOL     IDENTIFICATION  Patient Name: Spencer Acosta Birthdate: Dec 07, 1914 Sex: male Admission Date (Current Location): 01/16/2017  [place] and [place] Number:  Engineering geologist and Address:  [facility], [address], [place], [place] [address]      Provider Number: [id number]  Attending Physician Name and Address:  Hillary Bow, MD  Relative Name and Phone Number:       Current Level of Care: Hospital Recommended Level of Care: [place] Prior Approval Number:    Date Approved/Denied:   PASRR Number: [id number]  Discharge Plan: SNF    Current Diagnoses: Patient Active Problem List   Diagnosis Date Noted  . Dyspnea 01/16/2017  . Pressure injury of skin 01/16/2017  . Anemia 12/09/2016  . Encounter for antineoplastic immunotherapy 12/09/2016  . Pneumonia 11/18/2016  . Acute cystitis 11/17/2016  . Bilateral renal cysts, <2cm non-complex.  11/17/2016  . Cellulitis of lower extremity 11/04/2016  . Right leg DVT ([place]) 11/04/2016  . Lymphoma ([place]) 11/04/2016  . CKD (chronic kidney disease), stage III 11/04/2016  . Gross hematuria 10/10/2016  . Inguinal lymphadenopathy 10/10/2016  . Atrial fibrillation ([place]) 03/20/2016  . Colitis 02/19/2016  . Bleeding hemorrhoid 07/19/2015  . Atherosclerosis of aorta ([place]) 10/01/2014  . Crohn's disease of colon ([place]) 03/11/2014  . BP (high blood pressure) 03/11/2014  . Hypertriglyceridemia 03/11/2014  . Peripheral vascular disease ([place]) 03/11/2014  . Type 2 diabetes mellitus ([place]) 03/11/2014    Orientation RESPIRATION BLADDER Height & Weight     Self, Place, Situation  Normal, O2 (2) Incontinent Weight: 179 lb 1.6 oz (81.2 kg) Height:  5\' 8"  (172.7 cm)  BEHAVIORAL SYMPTOMS/MOOD NEUROLOGICAL BOWEL NUTRITION STATUS   (none) Grand mal Continent Diet (regular)  AMBULATORY STATUS COMMUNICATION OF NEEDS Skin   Extensive Assist Verbally  PU Stage and Appropriate Care                       Personal Care Assistance Level of Assistance  Bathing, Dressing Bathing Assistance: Maximum assistance   Dressing Assistance: Maximum assistance     Functional Limitations Info  Hearing   Hearing Info: Impaired      SPECIAL CARE FACTORS FREQUENCY  PT (By licensed PT)     PT Frequency: dnr              Contractures Contractures Info: Not present    Additional Factors Info  Code Status, Allergies Code Status Info: dnr Allergies Info: sulfa           Current Medications (01/16/2017):  This is the current hospital active medication list Current Facility-Administered Medications  Medication Dose Route Frequency Provider Last Rate Last Dose  . 0.9 %  sodium chloride infusion  250 mL Intravenous PRN Saundra Shelling, MD      . acetaminophen (TYLENOL) tablet 650 mg  650 mg Oral Q6H PRN Saundra Shelling, MD       Or  . acetaminophen (TYLENOL) suppository 650 mg  650 mg Rectal Q6H PRN Saundra Shelling, MD      . acidophilus (RISAQUAD) capsule 1 capsule  1 capsule Oral BID Saundra Shelling, MD   1 capsule at 01/16/17 0831  . allopurinol (ZYLOPRIM) tablet 100 mg  100 mg Oral Daily Saundra Shelling, MD   100 mg at 01/16/17 0831  . budesonide (PULMICORT) nebulizer solution 0.5 mg  0.5 mg Nebulization BID Hillary Bow, MD   0.5 mg  at 01/16/17 0748  . cholecalciferol (VITAMIN D) tablet 1,000 Units  1,000 Units Oral Daily Saundra Shelling, MD   1,000 Units at 01/16/17 0831  . enoxaparin (LOVENOX) injection 30 mg  30 mg Subcutaneous Daily Pavan Pyreddy, MD   30 mg at 01/16/17 0830  . furosemide (LASIX) injection 40 mg  40 mg Intravenous BID Hillary Bow, MD   40 mg at 01/16/17 0830  . ipratropium-albuterol (DUONEB) 0.5-2.5 (3) MG/3ML nebulizer solution 3 mL  3 mL Nebulization TID Hillary Bow, MD   3 mL at 01/16/17 1424  . latanoprost (XALATAN) 0.005 % ophthalmic solution 1 drop  1 drop Both Eyes QHS Pavan Pyreddy, MD      . Mesalamine  (ASACOL) DR capsule 800 mg  800 mg Oral TID Saundra Shelling, MD   800 mg at 01/16/17 0830  . metoprolol tartrate (LOPRESSOR) tablet 25 mg  25 mg Oral BID Hillary Bow, MD   25 mg at 01/16/17 1210  . multivitamin-lutein (OCUVITE-LUTEIN) capsule 1 capsule  1 capsule Oral Daily Saundra Shelling, MD   1 capsule at 01/16/17 0830  . ondansetron (ZOFRAN) tablet 4 mg  4 mg Oral Q6H PRN Saundra Shelling, MD       Or  . ondansetron (ZOFRAN) injection 4 mg  4 mg Intravenous Q6H PRN Saundra Shelling, MD      . Derrill Memo ON 01/17/2017] potassium chloride SA (K-DUR,KLOR-CON) CR tablet 20 mEq  20 mEq Oral Daily Srikar Sudini, MD      . potassium chloride SA (K-DUR,KLOR-CON) CR tablet 40 mEq  40 mEq Oral Q4H Srikar Sudini, MD   40 mEq at 01/16/17 1210  . predniSONE (DELTASONE) tablet 50 mg  50 mg Oral Q breakfast Srikar Sudini, MD      . senna-docusate (Senokot-S) tablet 1 tablet  1 tablet Oral QHS PRN Pavan Pyreddy, MD      . sodium chloride flush (NS) 0.9 % injection 3 mL  3 mL Intravenous Q12H Saundra Shelling, MD   3 mL at 01/16/17 [id number]  . sodium chloride flush (NS) 0.9 % injection 3 mL  3 mL Intravenous Q12H Saundra Shelling, MD   3 mL at 01/16/17 0831  . sodium chloride flush (NS) 0.9 % injection 3 mL  3 mL Intravenous PRN Saundra Shelling, MD         Discharge Medications: Please see discharge summary for a list of discharge medications.  Relevant Imaging Results:  Relevant Lab Results:   Additional Information ss: [id number]  Shela Leff, LCSW

## 2017-01-16 NOTE — ED Provider Notes (Signed)
[facility] Emergency Department Provider Note   ____________________________________________   First MD Initiated Contact with Patient 01/16/17 0134     (approximate)  I have reviewed the triage vital signs and the nursing notes.   HISTORY  Chief Complaint Shortness of Breath    HPI Spencer Acosta is a 83 y.o. male brought to the ED via EMS from [facility] [place] with a chief complaint of generalized weakness and shortness of breath. Per daughter, she discharged the patient from rehabilitation facility back to independent living at [facility] this evening. They were there for less than 2 hours when she called EMS for generalized weakness and shortness of breath. Patient has a history of follicular lymphoma on Rituxan, CKD, A. fib, history of DVT now on IVC filter secondary to bleeding issues with both Xarelto and Eliquis. He is on chronic oxygen therapy. Hospitalized in December for healthcare related pneumonia. Daughter denies recent fever, chills, abdominal pain, nausea, vomiting, diarrhea. Denies recent travel or trauma. Exertion makes his symptoms worse.   Past Medical History:  Diagnosis Date  . A-fib ([place])   . CKD (chronic kidney disease)   . Crohn's disease ([place])   . Diabetes mellitus without complication ([place])   . Dysrhythmia   . GI bleed   . HOH (hard of hearing)   . Hypertension   . Hypertriglyceridemia 03/11/2014  . Lymphoma ([place])   . PVD (peripheral vascular disease) [facility])     Patient Active Problem List   Diagnosis Date Noted  . Dyspnea 01/16/2017  . Pressure injury of skin 01/16/2017  . Anemia 12/09/2016  . Encounter for antineoplastic immunotherapy 12/09/2016  . Pneumonia 11/18/2016  . Acute cystitis 11/17/2016  . Bilateral renal cysts, <2cm non-complex.  11/17/2016  . Cellulitis of lower extremity 11/04/2016  . Right leg DVT ([place]) 11/04/2016  . Lymphoma ([place]) 11/04/2016  . CKD (chronic kidney disease), stage III 11/04/2016  . Gross  hematuria 10/10/2016  . Inguinal lymphadenopathy 10/10/2016  . Atrial fibrillation ([place]) 03/20/2016  . Colitis 02/19/2016  . Bleeding hemorrhoid 07/19/2015  . Atherosclerosis of aorta ([place]) 10/01/2014  . Crohn's disease of colon ([place]) 03/11/2014  . BP (high blood pressure) 03/11/2014  . Hypertriglyceridemia 03/11/2014  . Peripheral vascular disease ([place]) 03/11/2014  . Type 2 diabetes mellitus ([place]) 03/11/2014    Past Surgical History:  Procedure Laterality Date  . APPENDECTOMY    . PERIPHERAL VASCULAR CATHETERIZATION N/A 11/25/2016   Procedure: IVC Filter Insertion;  Surgeon: Algernon Huxley, MD;  Location: [facility];  Service: Cardiovascular;  Laterality: N/A;  . TURP VAPORIZATION      Prior to Admission medications   Medication Sig Start Date End Date Taking? Authorizing Provider  allopurinol (ZYLOPRIM) 100 MG tablet Take 1 tablet (100 mg total) by mouth daily. 11/27/16  Yes Richard Leslye Peer, MD  azithromycin (ZITHROMAX) 250 MG tablet Take 250 mg by mouth daily.   Yes Historical Provider, MD  budesonide (PULMICORT) 0.5 MG/2ML nebulizer solution Take 2 mLs (0.5 mg total) by nebulization 2 (two) times daily. 11/26/16  Yes Loletha Grayer, MD  cholecalciferol (VITAMIN D) 1000 units tablet Take 1,000 Units by mouth daily.   Yes Historical Provider, MD  furosemide (LASIX) 80 MG tablet Take 80 mg by mouth 2 (two) times daily.   Yes Historical Provider, MD  ipratropium-albuterol (DUONEB) 0.5-2.5 (3) MG/3ML SOLN Take 3 mLs by nebulization 3 (three) times daily. 11/26/16  Yes Richard Leslye Peer, MD  latanoprost (XALATAN) 0.005 % ophthalmic solution Place  1 drop into both eyes at bedtime.   Yes Historical Provider, MD  Mesalamine (ASACOL) 400 MG CPDR DR capsule Take 2 capsules (800 mg total) by mouth 3 (three) times daily. Patient taking differently: Take 400 mg by mouth 3 (three) times daily.  02/22/16  Yes Loletha Grayer, MD  metoprolol tartrate (LOPRESSOR) 25 MG tablet Take 1 tablet (25  mg total) by mouth 2 (two) times daily. 02/22/16  Yes Loletha Grayer, MD  Multiple Vitamins-Minerals (PRESERVISION AREDS PO) Take by mouth.   Yes Historical Provider, MD  multivitamin-lutein (OCUVITE-LUTEIN) CAPS capsule Take 1 capsule by mouth daily.   Yes Historical Provider, MD  potassium chloride SA (K-DUR,KLOR-CON) 20 MEQ tablet Take 20 mEq by mouth daily.   Yes Historical Provider, MD  Probiotic Product (ALIGN) 4 MG CAPS Take by mouth.   Yes Historical Provider, MD  acidophilus (RISAQUAD) CAPS capsule Take 1 capsule by mouth 2 (two) times daily. Patient not taking: Reported on 12/09/2016 02/22/16   Loletha Grayer, MD  cephALEXin (KEFLEX) 500 MG capsule Take 1 capsule (500 mg total) by mouth every 8 (eight) hours. Patient not taking: Reported on 12/23/2016 12/09/16   Lequita Asal, MD  nystatin (MYCOSTATIN) 100000 UNIT/ML suspension Take 5 mLs (500,000 Units total) by mouth 4 (four) times daily. Patient not taking: Reported on 01/16/2017 11/26/16   Loletha Grayer, MD  predniSONE (DELTASONE) 5 MG tablet Take 6 tabs po day1; take 5 tabs po day2; take 4 tabs po day3; take 3 tabs po day4; take 2 tabs po day5; take 1 tab po day6 Patient not taking: Reported on 12/09/2016 11/26/16   Loletha Grayer, MD    Allergies Sulfa antibiotics  Family History  Problem Relation Age of Onset  . Diabetes Other   . Diabetes Mother     Social History Social History  Substance Use Topics  . Smoking status: Former Smoker    Quit date: 10/22/1947  . Smokeless tobacco: Never Used     Comment: quit 70 years ago  . Alcohol use No     Comment: occasional    Review of Systems  Constitutional: Positive for generalized weakness. No fever/chills. Eyes: No visual changes. ENT: No sore throat. Cardiovascular: Denies chest pain. Respiratory: Positive for shortness of breath. Gastrointestinal: No abdominal pain.  No nausea, no vomiting.  No diarrhea.  No constipation. Genitourinary: Negative for  dysuria. Musculoskeletal: Negative for back pain. Skin: Negative for rash. Neurological: Negative for headaches, focal weakness or numbness.  10-point ROS otherwise negative.  ____________________________________________   PHYSICAL EXAM:  VITAL SIGNS: ED Triage Vitals  Enc Vitals Group     BP 01/16/17 0038 129/69     Pulse Rate 01/16/17 0038 95     Resp 01/16/17 0038 (!) 28     Temp 01/16/17 0038 97.5 F (36.4 C)     Temp Source 01/16/17 0038 Oral     SpO2 01/16/17 0035 98 %     Weight 01/16/17 0043 210 lb (95.3 kg)     Height 01/16/17 0043 5\' 9"  (1.753 m)     Head Circumference --      Peak Flow --      Pain Score 01/16/17 0044 0     Pain Loc --      Pain Edu? --      Excl. in [place]? --     Constitutional: Alert and oriented. Well appearing and in no acute distress. Eyes: Conjunctivae are normal. PERRL. EOMI. Head: Atraumatic. Nose: No congestion/rhinnorhea. Mouth/Throat: Mucous membranes  are moist.  Oropharynx non-erythematous. Neck: No stridor.   Cardiovascular: Normal rate, irregular rhythm. Grossly normal heart sounds.  Good peripheral circulation. Respiratory: Normal respiratory effort.  No retractions. Lungs CTAB. Gastrointestinal: Soft and nontender. No distention. No abdominal bruits. No CVA tenderness. Musculoskeletal: No lower extremity tenderness nor edema.  No joint effusions. Neurologic:  Normal speech and language. No gross focal neurologic deficits are appreciated.  Skin:  Skin is warm, dry and intact. No rash noted. Psychiatric: Mood and affect are normal. Speech and behavior are normal.  ____________________________________________   LABS (all labs ordered are listed, but only abnormal results are displayed)  Labs Reviewed  BASIC METABOLIC PANEL - Abnormal; Notable for the following:       Result Value   Sodium 147 (*)    CO2 35 (*)    Glucose, Bld 136 (*)    BUN 58 (*)    Creatinine, Ser 1.49 (*)    Calcium 8.2 (*)    GFR calc non Af Amer 37  (*)    GFR calc Af Amer 43 (*)    All other components within normal limits  CBC - Abnormal; Notable for the following:    RBC 2.79 (*)    Hemoglobin 8.2 (*)    HCT 24.7 (*)    RDW 18.2 (*)    All other components within normal limits  TROPONIN I - Abnormal; Notable for the following:    Troponin I 0.03 (*)    All other components within normal limits  BRAIN NATRIURETIC PEPTIDE - Abnormal; Notable for the following:    B Natriuretic Peptide 418.0 (*)    All other components within normal limits  CBC - Abnormal; Notable for the following:    RBC 2.82 (*)    Hemoglobin 8.1 (*)    HCT 25.3 (*)    RDW 18.4 (*)    All other components within normal limits  TROPONIN I - Abnormal; Notable for the following:    Troponin I 0.03 (*)    All other components within normal limits  BASIC METABOLIC PANEL - Abnormal; Notable for the following:    Sodium 147 (*)    Potassium 3.1 (*)    CO2 36 (*)    Glucose, Bld 118 (*)    BUN 53 (*)    Creatinine, Ser 1.59 (*)    Calcium 8.3 (*)    GFR calc non Af Amer 34 (*)    GFR calc Af Amer 40 (*)    All other components within normal limits  TROPONIN I  TROPONIN I   ____________________________________________  EKG  ED ECG REPORT I, Zoye Chandra J, the attending physician, personally viewed and interpreted this ECG.   Date: 01/16/2017  EKG Time: 0041  Rate: 95  Rhythm: atrial fibrillation, rate 95  Axis: Normal  Intervals:none  ST&T Change: Nonspecific  ____________________________________________  RADIOLOGY  Chest x-ray interpreted per Dr. Gerilyn Nestle: Small to moderate bilateral pleural effusions with basilar  atelectasis or consolidation bilaterally. Cardiac enlargement.  Changes are progressing since previous study.   ____________________________________________   PROCEDURES  Procedure(s) performed:   Rectal exam: External exam WNL without hemorrhoid. Tan stool on gloved finger which is weakly heme +. Stage I sacral decubitus  ulcer.  Procedures  Critical Care performed: No  ____________________________________________   INITIAL IMPRESSION / ASSESSMENT AND PLAN / ED COURSE  Pertinent labs & imaging results that were available during my care of the patient were reviewed by me and considered in my medical decision  making (see chart for details).  83 year old male with follicular lymphoma, recent hospitalization for HCAP on chronic oxygen therapy who presents with generalized weakness and shortness of breath. Found to be anemic with decrease H/H from prior, worsening renal insufficiency. Patient is DO NOT RESUSCITATE. Daughter is unsure at this time whether he would take a blood transfusion if needed but she is considering it. Unable to return to independent living at [facility]. Will discuss with hospitalist to evaluate patient in the emergency department for admission.      ____________________________________________   FINAL CLINICAL IMPRESSION(S) / ED DIAGNOSES  Final diagnoses:  Anemia, unspecified type  Shortness of breath  Weakness  Decubitus ulcer of sacral region, stage 1      NEW MEDICATIONS STARTED DURING THIS VISIT:  Current Discharge Medication List       Note:  This document was prepared using Dragon voice recognition software and may include unintentional dictation errors.    Paulette Blanch, MD 01/16/17 [phone]

## 2017-01-16 NOTE — ED Notes (Signed)
Patient transported to X-ray 

## 2017-01-16 NOTE — Progress Notes (Signed)
Initial Nutrition Assessment  DOCUMENTATION CODES:   Not applicable  INTERVENTION:  Recommend liberalizing diet from Heart Healthy to Regular.  Encouraged adequate intake of calories and protein with meals and beverages.   Would recommend regular multivitamin with minerals daily to promote wound healing. Patient ordered for Ocuvite-Lutein so will not order MVI.  NUTRITION DIAGNOSIS:   Increased nutrient needs related to wound healing as evidenced by estimated needs.  GOAL:   Patient will meet greater than or equal to 90% of their needs  MONITOR:   PO intake, Labs, I & O's, Weight trends  REASON FOR ASSESSMENT:   Malnutrition Screening Tool    ASSESSMENT:   83 year old male with PMHx of HTN, DM type 2, Crohn's disease, PVD, CKD, Lymphoma who presents with weakness and difficulty breathing found to have pleural effusion.    Spoke with patient and daughter at bedside. Daughter reports patient eats very well and has a good appetite. He typically eats 3 meals per day. He does not wear his top dentures so he eats soft foods such as omelette with cheese, vanilla yogurt, and fruit. She reports she orders meals for patients when he is in the hospital and also works with the rehab facility he was at previously to pick out meals he will enjoy. She reports patient will refuse any oral nutrition supplement.  UBW 178 lbs. Daughter reports patient had gained weight recently (noted he was 184 lbs on 12/18) due to fluid but has lost back to normal body weight with diuresis.  Medications reviewed and include: acidophilus, allopurinol, vitamin D 1000 units daily, Lasix 40 mg BID, potassium chloride 20 mEq 40 mEq Q4hrs today, prednisone 50 mg daily.  Labs reviewed: Sodium 147, Potassium 3.1, CO2 36, BUN 53, Creatinine 1.59, elevated Troponin.   Nutrition-Focused physical exam completed. Findings are moderate fat depletion, moderate muscle depletion. Unable to assess legs as they are wrapped  tightly. Some degree of wasting expected as patient is 83 years old.   Discussed with RN.   Diet Order:  Diet regular Room service appropriate? Yes; Fluid consistency: Thin  Skin:  Wound (see comment) (Stg III to buttocks)  Last BM:  Unknown  Height:   Ht Readings from Last 1 Encounters:  01/16/17 5\' 8"  (1.727 m)    Weight:   Wt Readings from Last 1 Encounters:  01/16/17 179 lb 1.6 oz (81.2 kg)    Ideal Body Weight:  70 kg  BMI:  Body mass index is 27.23 kg/m.  Estimated Nutritional Needs:   Kcal:  [id number] (MSJ x 1.2-1.4)  Protein:  80-100 grams (1-1.2 grams/kg)  Fluid:  2 L/day (25 ml/kg)  EDUCATION NEEDS:   Education needs addressed  Willey Blade, MS, RD, LDN Pager: [phone] After Hours Pager: [phone]

## 2017-01-16 NOTE — Progress Notes (Signed)
Same-day note  Patient seen and examined. Started bedside.  For shortness of breath. Feels normal. Extremely weak. On 2 L oxygen at home per  * Acute on chronic diastolic CHF with chronic bilateral pleural effusions * Folliclular lymphoma with anemia of chronic disease * Weakness  Discussed with patient and daughter. Daughter unable to care for patient at home. He was discharged from rehabilitation yesterday and was brought to the emergency room from home. Consult social work for long-term care placement. Physical therapy to see.  IV Lasix. Monitor input and output.  Likely discharge tomorrow.

## 2017-01-16 NOTE — ED Notes (Signed)
RN to beside with MD Beather Arbour for rectal exam

## 2017-01-16 NOTE — H&P (Signed)
[facility] NAME: Spencer Acosta    MR#:  [id number]  DATE OF BIRTH:  09/13/1915  DATE OF ADMISSION:  01/16/2017  PRIMARY CARE PHYSICIAN: Kirk Ruths., MD   REQUESTING/REFERRING PHYSICIAN:   CHIEF COMPLAINT:   Chief Complaint  Patient presents with  . Shortness of Breath    HISTORY OF PRESENT ILLNESS: Spencer Acosta  is a 83 y.o. male with a known history of Chronic kidney disease, chronic disease, diabetes mellitus type 2, peripheral vascular disease, lymphoma, hypertension, atrial fibrillation presented to the emergency room after patient started brought him as patient was feeling weak and rundown. Patient was treated for healthcare associated pneumonia last month at our hospital and discharged to rehabilitation center. After staying in the rehabilitation center he was discharged to home yesterday. Patient felt weak and rundown after going home and he expressed his wish to the daughter that he wants to again go back to rehabilitation center. As patient was already discharged from rehabilitation center patient's daughter brought him to the emergency room. Patient also complained of shortness of breath at home after discharge from the rehabilitation center. No complaints of any cough, fever and chills. He was evaluated in the emergency room and he was found to have some congestion in the lungs. His chest x-ray showed some pleural effusions. He patient has been on oxygen since last admission. He is comfortable on oxygen via nasal cannula. No complaints of any chest pain. Patient's daughter unable to take care of him at home and wants him again back in the rehabilitation facility .Hospitalist service was consulted for further care of the patient.  PAST MEDICAL HISTORY:   Past Medical History:  Diagnosis Date  . A-fib ([place])   . CKD (chronic kidney disease)   . Crohn's disease ([place])   . Diabetes mellitus without complication ([place])   .  Dysrhythmia   . GI bleed   . HOH (hard of hearing)   . Hypertension   . Hypertriglyceridemia 03/11/2014  . Lymphoma ([place])   . PVD (peripheral vascular disease) ([place])     PAST SURGICAL HISTORY: Past Surgical History:  Procedure Laterality Date  . APPENDECTOMY    . PERIPHERAL VASCULAR CATHETERIZATION N/A 11/25/2016   Procedure: IVC Filter Insertion;  Surgeon: Algernon Huxley, MD;  Location: [facility];  Service: Cardiovascular;  Laterality: N/A;  . TURP VAPORIZATION      SOCIAL HISTORY:  Social History  Substance Use Topics  . Smoking status: Former Smoker    Quit date: 10/22/1947  . Smokeless tobacco: Never Used     Comment: quit 70 years ago  . Alcohol use No     Comment: occasional    FAMILY HISTORY:  Family History  Problem Relation Age of Onset  . Diabetes Other   . Diabetes Mother     DRUG ALLERGIES:  Allergies  Allergen Reactions  . Sulfa Antibiotics Nausea And Vomiting    REVIEW OF SYSTEMS:   CONSTITUTIONAL: No fever, has weakness.  EYES: No blurred or double vision.  EARS, NOSE, AND THROAT: No tinnitus or ear pain.  RESPIRATORY: No cough, has shortness of breath on exertion,  No wheezing or hemoptysis.  CARDIOVASCULAR: No chest pain, orthopnea, edema.  GASTROINTESTINAL: No nausea, vomiting, diarrhea or abdominal pain.  GENITOURINARY: No dysuria, hematuria.  ENDOCRINE: No polyuria, nocturia,  HEMATOLOGY: No anemia, easy bruising or bleeding SKIN: No rash or lesion. MUSCULOSKELETAL: No joint pain or arthritis.   NEUROLOGIC:  No tingling, numbness, weakness.  PSYCHIATRY: No anxiety or depression.   MEDICATIONS AT HOME:  Prior to Admission medications   Medication Sig Start Date End Date Taking? Authorizing Provider  allopurinol (ZYLOPRIM) 100 MG tablet Take 1 tablet (100 mg total) by mouth daily. 11/27/16  Yes Richard Leslye Peer, MD  azithromycin (ZITHROMAX) 250 MG tablet Take 250 mg by mouth daily.   Yes Historical Provider, MD  budesonide  (PULMICORT) 0.5 MG/2ML nebulizer solution Take 2 mLs (0.5 mg total) by nebulization 2 (two) times daily. 11/26/16  Yes Loletha Grayer, MD  cholecalciferol (VITAMIN D) 1000 units tablet Take 1,000 Units by mouth daily.   Yes Historical Provider, MD  furosemide (LASIX) 80 MG tablet Take 80 mg by mouth 2 (two) times daily.   Yes Historical Provider, MD  ipratropium-albuterol (DUONEB) 0.5-2.5 (3) MG/3ML SOLN Take 3 mLs by nebulization 3 (three) times daily. 11/26/16  Yes Richard Leslye Peer, MD  latanoprost (XALATAN) 0.005 % ophthalmic solution Place 1 drop into both eyes at bedtime.   Yes Historical Provider, MD  Mesalamine (ASACOL) 400 MG CPDR DR capsule Take 2 capsules (800 mg total) by mouth 3 (three) times daily. Patient taking differently: Take 400 mg by mouth 3 (three) times daily.  02/22/16  Yes Loletha Grayer, MD  metoprolol tartrate (LOPRESSOR) 25 MG tablet Take 1 tablet (25 mg total) by mouth 2 (two) times daily. 02/22/16  Yes Loletha Grayer, MD  Multiple Vitamins-Minerals (PRESERVISION AREDS PO) Take by mouth.   Yes Historical Provider, MD  multivitamin-lutein (OCUVITE-LUTEIN) CAPS capsule Take 1 capsule by mouth daily.   Yes Historical Provider, MD  potassium chloride SA (K-DUR,KLOR-CON) 20 MEQ tablet Take 20 mEq by mouth daily.   Yes Historical Provider, MD  Probiotic Product (ALIGN) 4 MG CAPS Take by mouth.   Yes Historical Provider, MD  acidophilus (RISAQUAD) CAPS capsule Take 1 capsule by mouth 2 (two) times daily. Patient not taking: Reported on 12/09/2016 02/22/16   Loletha Grayer, MD  cephALEXin (KEFLEX) 500 MG capsule Take 1 capsule (500 mg total) by mouth every 8 (eight) hours. Patient not taking: Reported on 12/23/2016 12/09/16   Lequita Asal, MD  nystatin (MYCOSTATIN) 100000 UNIT/ML suspension Take 5 mLs (500,000 Units total) by mouth 4 (four) times daily. Patient not taking: Reported on 01/16/2017 11/26/16   Loletha Grayer, MD  predniSONE (DELTASONE) 5 MG tablet Take 6 tabs po  day1; take 5 tabs po day2; take 4 tabs po day3; take 3 tabs po day4; take 2 tabs po day5; take 1 tab po day6 Patient not taking: Reported on 12/09/2016 11/26/16   Loletha Grayer, MD      PHYSICAL EXAMINATION:   VITAL SIGNS: Blood pressure 111/78, pulse 91, temperature 97.5 F (36.4 C), temperature source Oral, resp. rate (!) 21, height 5\' 9"  (1.753 m), weight 95.3 kg (210 lb), SpO2 100 %.  GENERAL:  83 y.o.-year-old patient lying in the bed with no acute distress.  EYES: Pupils equal, round, reactive to light and accommodation. No scleral icterus. Extraocular muscles intact.  HEENT: Head atraumatic, normocephalic. Oropharynx and nasopharynx clear.  NECK:  Supple, no jugular venous distention. No thyroid enlargement, no tenderness.  LUNGS: Decreased breath sounds bilaterally, basal crepitations heard. No use of accessory muscles of respiration.  CARDIOVASCULAR: S1, S2 normal. No murmurs, rubs, or gallops.  ABDOMEN: Soft, nontender, nondistended. Bowel sounds present. No organomegaly or mass.  EXTREMITIES: No pedal edema, cyanosis, or clubbing.  NEUROLOGIC: Cranial nerves II through XII are intact. Muscle strength 5/5 in all  extremities. Sensation intact. Gait not checked.  PSYCHIATRIC: The patient is alert and oriented x 3.  SKIN: No obvious rash, lesion, or ulcer.   LABORATORY PANEL:   CBC  Recent Labs Lab 01/16/17 0039  WBC 10.4  HGB 8.2*  HCT 24.7*  PLT 218  MCV 88.6  MCH 29.3  MCHC 33.1  RDW 18.2*   ------------------------------------------------------------------------------------------------------------------  Chemistries   Recent Labs Lab 01/16/17 0039  NA 147*  K 3.5  CL 102  CO2 35*  GLUCOSE 136*  BUN 58*  CREATININE 1.49*  CALCIUM 8.2*   ------------------------------------------------------------------------------------------------------------------ estimated creatinine clearance is 30.8 mL/min (by C-G formula based on SCr of 1.49 mg/dL  (H)). ------------------------------------------------------------------------------------------------------------------ No results for input(s): TSH, T4TOTAL, T3FREE, THYROIDAB in the last 72 hours.  Invalid input(s): FREET3   Coagulation profile No results for input(s): INR, PROTIME in the last 168 hours. ------------------------------------------------------------------------------------------------------------------- No results for input(s): DDIMER in the last 72 hours. -------------------------------------------------------------------------------------------------------------------  Cardiac Enzymes  Recent Labs Lab 01/16/17 0039  TROPONINI 0.03*   ------------------------------------------------------------------------------------------------------------------ Invalid input(s): POCBNP  ---------------------------------------------------------------------------------------------------------------  Urinalysis    Component Value Date/Time   COLORURINE YELLOW (A) 11/18/2016 0954   APPEARANCEUR HAZY (A) 11/18/2016 0954   LABSPEC 1.016 11/18/2016 0954   PHURINE 5.0 11/18/2016 0954   GLUCOSEU NEGATIVE 11/18/2016 0954   HGBUR LARGE (A) 11/18/2016 0954   BILIRUBINUR NEGATIVE 11/18/2016 0954   KETONESUR NEGATIVE 11/18/2016 0954   PROTEINUR 30 (A) 11/18/2016 0954   NITRITE NEGATIVE 11/18/2016 0954   LEUKOCYTESUR NEGATIVE 11/18/2016 0954     RADIOLOGY: Dg Chest 2 View  Result Date: 01/16/2017 CLINICAL DATA:  Recent discharge from hospital. Now increased shortness of breath. History of CHF and lymphoma. EXAM: CHEST  2 VIEW COMPARISON:  12/09/2016 FINDINGS: Shallow inspiration. Small to moderate bilateral pleural effusions with atelectasis or infiltration in the lung bases. This is progressing since previous study. Cardiac silhouette is somewhat obscured by the parenchymal process but heart size appears enlarged. No vascular congestion. No pneumothorax. Tortuous aorta.  Degenerative changes in the spine. IMPRESSION: Small to moderate bilateral pleural effusions with basilar atelectasis or consolidation bilaterally. Cardiac enlargement. Changes are progressing since previous study. Electronically Signed   By: Lucienne Capers M.D.   On: 01/16/2017 01:39    EKG: Orders placed or performed during the hospital encounter of 01/16/17  . ED EKG within 10 minutes  . ED EKG within 10 minutes  . EKG 12-Lead  . EKG 12-Lead    IMPRESSION AND PLAN: 83 year old elderly male patient with history of lymphoma, chronic kidney disease, Crohn's disease, peripheral vascular disease, hypertension, hypertriglyceridemia presented to the emergency room with weakness and difficulty breathing. Admitting diagnosis 1. Dyspnea could be secondary to pleural effusion 2. Fluid overload (pleural effusion) 3. Chronic kidney disease 4. Anemia of chronic disease 5. Crohn's disease 6. Lymphoma Treatment plan Admit patient under observation bed Diurese patient with 1 dose of IV Lasix 40 mg Monitor renal function Physical therapy evaluation for endurance training Case management evaluation Resume home medications Continue oxygen via nasal cannula. Supportive care  All the records are reviewed and case discussed with ED provider. Management plans discussed with the patient, family and they are in agreement.  CODE STATUS:DNR Surrogate decision maker : Daughter Code Status History    Date Active Date Inactive Code Status Order ID Comments User Context   11/18/2016 11:38 AM 11/26/2016  4:34 PM DNR [id number]  Henreitta Leber, MD Inpatient   11/04/2016 11:26 PM 11/06/2016  2:47 PM DNR [id number]  Shanon Brow  Jannifer Franklin, MD Inpatient   10/04/2016  8:29 PM 10/05/2016 12:55 AM DNR [id number]  Hinda Kehr, MD ED   02/19/2016  9:28 PM 02/22/2016  5:43 PM DNR [id number]  Gladstone Lighter, MD Inpatient    Questions for Most Recent Historical Code Status (Order [id number])    Question Answer Comment   In the  event of cardiac or respiratory ARREST Do not call a "code blue"    In the event of cardiac or respiratory ARREST Do not perform Intubation, CPR, defibrillation or ACLS    In the event of cardiac or respiratory ARREST Use medication by any route, position, wound care, and other measures to relive pain and suffering. May use oxygen, suction and manual treatment of airway obstruction as needed for comfort.         Advance Directive Documentation   Flowsheet Row Most Recent Value  Type of Advance Directive  Healthcare Power of Attorney, Living will  Pre-existing out of facility DNR order (yellow form or pink MOST form)  No data  "MOST" Form in Place?  No data       TOTAL TIME TAKING CARE OF THIS PATIENT: 50 minutes.    Saundra Shelling M.D on 01/16/2017 at 4:05 AM  Between 7am to 6pm - Pager - [phone]  After 6pm go to [email or web address] - password [email or web address] [facility] [place] [facility]  Office  [phone]  CC: Primary care physician; Kirk Ruths., MD   '

## 2017-01-16 NOTE — ED Triage Notes (Signed)
Per EMS: Patient recently discharged from [facility]. Pt normally lives in independent living at [facility]. Pt brought back to independent living today and c/o increased SOB.   Pt has hx of CHF and Lyphoma.

## 2017-01-17 DIAGNOSIS — I130 Hypertensive heart and chronic kidney disease with heart failure and stage 1 through stage 4 chronic kidney disease, or unspecified chronic kidney disease: Secondary | ICD-10-CM | POA: Diagnosis not present

## 2017-01-17 LAB — BASIC METABOLIC PANEL
Anion gap: 6 (ref 5–15)
BUN: 53 mg/dL — AB (ref 6–20)
CHLORIDE: 103 mmol/L (ref 101–111)
CO2: 39 mmol/L — AB (ref 22–32)
Calcium: 8.9 mg/dL (ref 8.9–10.3)
Creatinine, Ser: 1.42 mg/dL — ABNORMAL HIGH (ref 0.61–1.24)
GFR calc non Af Amer: 39 mL/min — ABNORMAL LOW (ref >60)
GFR, EST AFRICAN AMERICAN: 45 mL/min — AB (ref >60)
Glucose, Bld: 185 mg/dL — ABNORMAL HIGH (ref 65–99)
POTASSIUM: 4.3 mmol/L (ref 3.5–5.1)
SODIUM: 148 mmol/L — AB (ref 135–145)

## 2017-01-17 NOTE — Care Management Obs Status (Signed)
[place] NOTIFICATION   Patient Details  Name: Spencer Acosta MRN: [id number] Date of Birth: November 24, 1915   Medicare Observation Status Notification Given:  Yes    Bolton Canupp A, RN 01/17/2017, 3:35 PM

## 2017-01-17 NOTE — Progress Notes (Signed)
[facility] at [place] NAME: Spencer Acosta    MR#:  [id number]  DATE OF BIRTH:  [birth year]  SUBJECTIVE:  CHIEF COMPLAINT:  Patient is short of breath with minimal exertion. Daughter at bedside  REVIEW OF SYSTEMS:  CONSTITUTIONAL: No fever, fatigue or weakness.  EYES: No blurred or double vision.  EARS, NOSE, AND THROAT: No tinnitus or ear pain.  RESPIRATORY: No cough, Reports exertional shortness of breath, denies wheezing or hemoptysis.  CARDIOVASCULAR: No chest pain, orthopnea, edema.  GASTROINTESTINAL: No nausea, vomiting, diarrhea or abdominal pain.  GENITOURINARY: No dysuria, hematuria.  ENDOCRINE: No polyuria, nocturia,  HEMATOLOGY: No anemia, easy bruising or bleeding SKIN: No rash or lesion. MUSCULOSKELETAL: No joint pain or arthritis.   NEUROLOGIC: No tingling, numbness, weakness.  PSYCHIATRY: No anxiety or depression.   DRUG ALLERGIES:   Allergies  Allergen Reactions  . Sulfa Antibiotics Nausea And Vomiting    VITALS:  Blood pressure 117/70, pulse 92, temperature 97.8 F (36.6 C), temperature source Oral, resp. rate 18, height 5\' 8"  (1.727 m), weight 81.2 kg (179 lb 1.6 oz), SpO2 100 %.  PHYSICAL EXAMINATION:  GENERAL:  83 y.o.-year-old patient lying in the bed with no acute distress.  EYES: Pupils equal, round, reactive to light and accommodation. No scleral icterus. Extraocular muscles intact.  HEENT: Head atraumatic, normocephalic. Oropharynx and nasopharynx clear.  NECK:  Supple, no jugular venous distention. No thyroid enlargement, no tenderness.  LUNGS: Moderate breath sounds bilaterally, no wheezing, positive rales,rhonchi , no crepitation. No use of accessory muscles of respiration.  CARDIOVASCULAR: S1, S2 normal. No murmurs, rubs, or gallops.  ABDOMEN: Soft, nontender, nondistended. Bowel sounds present. No organomegaly or mass.  EXTREMITIES: 1+ pedal edema, no cyanosis, or clubbing.  NEUROLOGIC: Cranial  nerves II through XII are intact. Muscle strength 5/5 in all extremities. Sensation intact. Gait not checked.  PSYCHIATRIC: The patient is alert and oriented x 3.  SKIN: No obvious rash, lesion, or ulcer.    LABORATORY PANEL:   CBC  Recent Labs Lab 01/16/17 0553  WBC 10.2  HGB 8.1*  HCT 25.3*  PLT 206   ------------------------------------------------------------------------------------------------------------------  Chemistries   Recent Labs Lab 01/17/17 0546  NA 148*  K 4.3  CL 103  CO2 39*  GLUCOSE 185*  BUN 53*  CREATININE 1.42*  CALCIUM 8.9   ------------------------------------------------------------------------------------------------------------------  Cardiac Enzymes  Recent Labs Lab 01/16/17 0553  TROPONINI 0.03*   ------------------------------------------------------------------------------------------------------------------  RADIOLOGY:  Dg Chest 2 View  Result Date: 01/16/2017 CLINICAL DATA:  Recent discharge from hospital. Now increased shortness of breath. History of CHF and lymphoma. EXAM: CHEST  2 VIEW COMPARISON:  12/09/2016 FINDINGS: Shallow inspiration. Small to moderate bilateral pleural effusions with atelectasis or infiltration in the lung bases. This is progressing since previous study. Cardiac silhouette is somewhat obscured by the parenchymal process but heart size appears enlarged. No vascular congestion. No pneumothorax. Tortuous aorta. Degenerative changes in the spine. IMPRESSION: Small to moderate bilateral pleural effusions with basilar atelectasis or consolidation bilaterally. Cardiac enlargement. Changes are progressing since previous study. Electronically Signed   By: Lucienne Capers M.D.   On: 01/16/2017 01:39    EKG:   Orders placed or performed during the hospital encounter of 01/16/17  . EKG 12-Lead  . EKG 12-Lead    ASSESSMENT AND PLAN:    * Acute on chronic diastolic CHF with bilateral pleural effusions Treat  with IV Lasix along with potassium supplementation.  He does have chronic bilateral pleural  effusion which is progressively getting worse according to the chest x-ray. Patient has Chronic shortness of breath at baseline Continue oral Lasix at 80 mg twice a day at the time of discharge Will need close follow-up at rehabilitation. Monitor renal function check BMP in a.m.  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 He follows up at the cancer center with Dr. Mike Gip.  * CKD stage III is stable  * Chronic respiratory failure on 2 L oxygen  * Weakness. He'll need physical therapy at rehabilitation.  * Paroxysmal atrial fibrillation. On metoprolol. No anticoagulation due to history of GI bleed.   Disposition skilled nursing facility  All the records are reviewed and case discussed with Care Management/Social Workerr. Management plans discussed with the patient, family and they are in agreement.  CODE STATUS: dnr  TOTAL TIME TAKING CARE OF THIS PATIENT: 36 minutes.   POSSIBLE D/C IN 1-2DAYS, DEPENDING ON CLINICAL CONDITION.  Note: This dictation was prepared with Dragon dictation along with smaller phrase technology. Any transcriptional errors that result from this process are unintentional.   Nicholes Mango M.D on 01/17/2017 at 2:58 PM  Between 7am to 6pm - Pager - [phone] After 6pm go to [email or web address] - password [email or web address] [place] [facility]  Office  [phone]  CC: Primary care physician; Kirk Ruths., MD

## 2017-01-18 DIAGNOSIS — I130 Hypertensive heart and chronic kidney disease with heart failure and stage 1 through stage 4 chronic kidney disease, or unspecified chronic kidney disease: Secondary | ICD-10-CM | POA: Diagnosis not present

## 2017-01-18 LAB — BASIC METABOLIC PANEL
Anion gap: 8 (ref 5–15)
BUN: 58 mg/dL — ABNORMAL HIGH (ref 6–20)
CHLORIDE: 103 mmol/L (ref 101–111)
CO2: 39 mmol/L — AB (ref 22–32)
CREATININE: 1.48 mg/dL — AB (ref 0.61–1.24)
Calcium: 8.8 mg/dL — ABNORMAL LOW (ref 8.9–10.3)
GFR calc non Af Amer: 37 mL/min — ABNORMAL LOW (ref >60)
GFR, EST AFRICAN AMERICAN: 43 mL/min — AB (ref >60)
Glucose, Bld: 134 mg/dL — ABNORMAL HIGH (ref 65–99)
Potassium: 3.8 mmol/L (ref 3.5–5.1)
Sodium: 150 mmol/L — ABNORMAL HIGH (ref 135–145)

## 2017-01-18 NOTE — Clinical Social Work Note (Signed)
CSW met with patient and his daughter at bedside to discuss dc plan for today. The patient's daughter reported that she will be transporting; however, she indicated that she needs to retrieve his oxygen from home to do so. She plans to transport after lunch. The facility is aware. CSW will con't to follow pending additional dc needs.  Santiago Bumpers, MSW, Latanya Presser [phone]

## 2017-01-18 NOTE — Discharge Summary (Addendum)
Spencer Acosta at [place] NAME: Spencer Acosta    MR#:  [id number]  DATE OF BIRTH:  1915/02/12  DATE OF ADMISSION:  01/16/2017 ADMITTING PHYSICIAN: Saundra Shelling, MD  DATE OF DISCHARGE: 01/18/2017  PRIMARY CARE PHYSICIAN: Kirk Ruths., MD    ADMISSION DIAGNOSIS:  Shortness of breath [R06.02] Weakness [R53.1] Decubitus ulcer of sacral region, stage 1 [L89.151] Anemia, unspecified type [D64.9]  DISCHARGE DIAGNOSIS:  Active Problems:   Dyspnea   Pressure injury of skin   SECONDARY DIAGNOSIS:   Past Medical History:  Diagnosis Date  . A-fib ([place])   . CKD (chronic kidney disease)   . Crohn's disease ([place])   . Diabetes mellitus without complication ([place])   . Dysrhythmia   . GI bleed   . HOH (hard of hearing)   . Hypertension   . Hypertriglyceridemia 03/11/2014  . Lymphoma ([place])   . PVD (peripheral vascular disease) ([place])     HOSPITAL COURSE:   1. Acute on chronic diastolic congestive heart failure with bilateral pleural effusions. Chronic respiratory failure. The patient was given IV Lasix during the hospital course. When the patient is sleeping he is breathing more comfortably while awake he does breathe a little bit harder. Lungs are clear upon discharge back to facility. Continue oxygen supplementation 2 L nasal cannula. 2. 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 Follow-up with oncology as outpatient. 3. Hypernatremia. Patient must eat and drink. Lasix can dehydrate. 4. Chronic kidney disease stage III. Creatinine stable last few days. 5. 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 6. Weakness. Patient will go back to 28. 7. Paroxysmal atrial fibrillation on metoprolol. Oh anticoagulation secondary to anemia and GI bleed in the past. 8. History of DVT status post IVC filter 9. Patient is a DO NOT RESUSCITATE. 10 Hospice referal at facility  DISCHARGE CONDITIONS:   Satisfactory  CONSULTS  OBTAINED:   none  DRUG ALLERGIES:   Allergies  Allergen Reactions  . Sulfa Antibiotics Nausea And Vomiting    DISCHARGE MEDICATIONS:   Current Discharge Medication List    CONTINUE these medications which have NOT CHANGED   Details  allopurinol (ZYLOPRIM) 100 MG tablet Take 1 tablet (100 mg total) by mouth daily. Qty: 30 tablet, Refills: 0    budesonide (PULMICORT) 0.5 MG/2ML nebulizer solution Take 2 mLs (0.5 mg total) by nebulization 2 (two) times daily. Qty: 120 mL, Refills: 0    cholecalciferol (VITAMIN D) 1000 units tablet Take 1,000 Units by mouth daily.    furosemide (LASIX) 80 MG tablet Take 80 mg by mouth 2 (two) times daily.    ipratropium-albuterol (DUONEB) 0.5-2.5 (3) MG/3ML SOLN Take 3 mLs by nebulization 3 (three) times daily. Qty: 360 mL, Refills: 0    latanoprost (XALATAN) 0.005 % ophthalmic solution Place 1 drop into both eyes at bedtime.    Mesalamine (ASACOL) 400 MG CPDR DR capsule Take 2 capsules (800 mg total) by mouth 3 (three) times daily. Qty: 180 capsule, Refills: 0    metoprolol tartrate (LOPRESSOR) 25 MG tablet Take 1 tablet (25 mg total) by mouth 2 (two) times daily. Qty: 60 tablet, Refills: 0    !! Multiple Vitamins-Minerals (PRESERVISION AREDS PO) Take by mouth.    !! multivitamin-lutein (OCUVITE-LUTEIN) CAPS capsule Take 1 capsule by mouth daily.    potassium chloride SA (K-DUR,KLOR-CON) 20 MEQ tablet Take 20 mEq by mouth daily.    acidophilus (RISAQUAD) CAPS capsule Take 1 capsule by mouth 2 (two) times daily. Qty:  60 capsule, Refills: 0    nystatin (MYCOSTATIN) 100000 UNIT/ML suspension Take 5 mLs (500,000 Units total) by mouth 4 (four) times daily. Qty: 60 mL, Refills: 0    predniSONE (DELTASONE) 5 MG tablet Take 6 tabs po day1; take 5 tabs po day2; take 4 tabs po day3; take 3 tabs po day4; take 2 tabs po day5; take 1 tab po day6 Qty: 21 tablet, Refills: 0     !! - Potential duplicate medications found. Please discuss with  provider.    STOP taking these medications     azithromycin (ZITHROMAX) 250 MG tablet      cephALEXin (KEFLEX) 500 MG capsule          DISCHARGE INSTRUCTIONS:   Follow-up with Dr. at rehabilitation facility  If you experience worsening of your admission symptoms, develop shortness of breath, life threatening emergency, suicidal or homicidal thoughts you must seek medical attention immediately by calling 911 or calling your MD immediately  if symptoms less severe.  You Must read complete instructions/literature along with all the possible adverse reactions/side effects for all the Medicines you take and that have been prescribed to you. Take any new Medicines after you have completely understood and accept all the possible adverse reactions/side effects.   Please note  You were cared for by a hospitalist during your hospital stay. If you have any questions about your discharge medications or the care you received while you were in the hospital after you are discharged, you can call the unit and asked to speak with the hospitalist on call if the hospitalist that took care of you is not available. Once you are discharged, your primary care physician will handle any further medical issues. Please note that NO REFILLS for any discharge medications will be authorized once you are discharged, as it is imperative that you return to your primary care physician (or establish a relationship with a primary care physician if you do not have one) for your aftercare needs so that they can reassess your need for medications and monitor your lab values.    Today   CHIEF COMPLAINT:   Chief Complaint  Patient presents with  . Shortness of Breath    HISTORY OF PRESENT ILLNESS:  Spencer Acosta  is a 83 y.o. male with a known history of Follicular lymphoma presents with shortness of breath.   VITAL SIGNS:  Blood pressure 114/64, pulse 86, temperature 97.9 F (36.6 C), temperature source Oral, resp.  rate 18, height 5\' 8"  (1.727 m), weight 81.2 kg (179 lb 1.6 oz), SpO2 98 %.   PHYSICAL EXAMINATION:  GENERAL:  83 y.o.-year-old patient lying in the bed with no acute distress.  EYES: Pupils equal, round, reactive to light and accommodation. No scleral icterus. Extraocular muscles intact.  HEENT: Head atraumatic, normocephalic. Oropharynx and nasopharynx clear.  NECK:  Supple, no jugular venous distention. No thyroid enlargement, no tenderness.  LUNGS: Normal breath sounds bilaterally, no wheezing, rales,rhonchi or crepitation. No use of accessory muscles of respiration.  CARDIOVASCULAR: S1, S2 normal. No murmurs, rubs, or gallops.  ABDOMEN: Soft, non-tender, non-distended. Bowel sounds present. No organomegaly or mass.  EXTREMITIES: No pedal edema, cyanosis, or clubbing.  NEUROLOGIC: Decreased hearing. Muscle strength 5/5 in all extremities. Sensation intact. Gait not checked.  PSYCHIATRIC: The patient is alert and answers yes or no questions.  SKIN: Patient with Unna boots on  DATA REVIEW:   CBC  Recent Labs Lab 01/16/17 0553  WBC 10.2  HGB 8.1*  HCT 25.3*  PLT 206    Chemistries   Recent Labs Lab 01/18/17 0554  NA 150*  K 3.8  CL 103  CO2 39*  GLUCOSE 134*  BUN 58*  CREATININE 1.48*  CALCIUM 8.8*    Cardiac Enzymes  Recent Labs Lab 01/16/17 0553  TROPONINI 0.03*   Management plans discussed with the patient, family and they are in agreement.  CODE STATUS:     Code Status Orders        Start     Ordered   01/16/17 0532  Do not attempt resuscitation (DNR)  Continuous    Question Answer Comment  In the event of cardiac or respiratory ARREST Do not call a "code blue"   In the event of cardiac or respiratory ARREST Do not perform Intubation, CPR, defibrillation or ACLS   In the event of cardiac or respiratory ARREST Use medication by any route, position, wound care, and other measures to relive pain and suffering. May use oxygen, suction and manual  treatment of airway obstruction as needed for comfort.      01/16/17 0531    Code Status History    Date Active Date Inactive Code Status Order ID Comments User Context   11/18/2016 11:38 AM 11/26/2016  4:34 PM DNR [id number]  Henreitta Leber, MD Inpatient   11/04/2016 11:26 PM 11/06/2016  2:47 PM DNR [id number]  Lance Coon, MD Inpatient   10/04/2016  8:29 PM 10/05/2016 12:55 AM DNR [id number]  Hinda Kehr, MD ED   02/19/2016  9:28 PM 02/22/2016  5:43 PM DNR [id number]  Gladstone Lighter, MD Inpatient    Advance Directive Documentation   Flowsheet Row Most Recent Value  Type of Advance Directive  Healthcare Power of Attorney, Living will  Pre-existing out of facility DNR order (yellow form or pink MOST form)  No data  "MOST" Form in Place?  No data      TOTAL TIME TAKING CARE OF THIS PATIENT: 32 minutes.    Loletha Grayer M.D on 01/18/2017 at 9:24 AM  Between 7am to 6pm - Pager - [phone]  After 6pm go to [email or web address] - password [email or web address]  [facility] Office  [phone]  CC: Primary care physician; Kirk Ruths., MD

## 2017-01-18 NOTE — Progress Notes (Signed)
Pt discharged via his daughter to [facility]. Writer called report to accepting nurse at [facility]. Pt being discharged on 2L 02 [place]. No distress noted, IV and tele removed. Ammie Dalton, RN

## 2017-01-19 DIAGNOSIS — I482 Chronic atrial fibrillation: Secondary | ICD-10-CM

## 2017-01-19 DIAGNOSIS — I130 Hypertensive heart and chronic kidney disease with heart failure and stage 1 through stage 4 chronic kidney disease, or unspecified chronic kidney disease: Secondary | ICD-10-CM

## 2017-01-19 DIAGNOSIS — C8280 Other types of follicular lymphoma, unspecified site: Secondary | ICD-10-CM

## 2017-01-19 DIAGNOSIS — E1122 Type 2 diabetes mellitus with diabetic chronic kidney disease: Secondary | ICD-10-CM | POA: Diagnosis not present

## 2017-01-19 DIAGNOSIS — E119 Type 2 diabetes mellitus without complications: Secondary | ICD-10-CM | POA: Diagnosis not present

## 2017-01-19 DIAGNOSIS — I5033 Acute on chronic diastolic (congestive) heart failure: Secondary | ICD-10-CM

## 2017-01-19 DIAGNOSIS — N183 Chronic kidney disease, stage 3 (moderate): Secondary | ICD-10-CM | POA: Diagnosis not present

## 2017-01-19 DIAGNOSIS — F39 Unspecified mood [affective] disorder: Secondary | ICD-10-CM | POA: Diagnosis not present

## 2017-01-21 ENCOUNTER — Telehealth: Payer: Self-pay | Admitting: Internal Medicine

## 2017-01-21 NOTE — Telephone Encounter (Signed)
[facility]  Patient Name: Spencer Acosta  DOB: [birth year]    Initial Comment Caller with [facility] and wanting to know if they can increase the pts pain meds. Having trouble breathing and in pain.    Nurse Assessment  Nurse: Harlow Mares, RN, Suanne Marker Date/Time (Eastern Time): 01/21/2017 10:42:10 PM  Confirm and document reason for call. If symptomatic, describe symptoms. ---Caller with [facility] and wanting to know if they can increase the pts pain meds. Having trouble breathing and in pain.  Does the patient have any new or worsening symptoms? ---Yes  Will a triage be completed? ---No  Select reason for no triage. ---Other  Please document clinical information provided and list any resource used. ---Advised to hold the line while nurse pages MD on call.     Guidelines    Guideline Title Affirmed Question Affirmed Notes       Final Disposition User   Clinical Call Harlow Mares, RN, Suanne Marker

## 2017-01-22 ENCOUNTER — Telehealth: Payer: Self-pay | Admitting: Internal Medicine

## 2017-01-22 NOTE — Telephone Encounter (Signed)
See phone noted from Dr Nicki Reaper 01/22/17.

## 2017-01-22 NOTE — Telephone Encounter (Signed)
Reviewed status with Tammi RN this morning Pain has not really been an issue for him but he may be transitioning to active dying. Will titrate meds (mostly the morphine) as needed to keep him comfortable

## 2017-01-22 NOTE — Telephone Encounter (Signed)
See other note

## 2017-01-22 NOTE — Telephone Encounter (Signed)
Hospice nurse Maudie Mercury) called and reported this pt was a new admit and that you were his attending physician.  Pt is having increased pain and sob.  Is currently receiving ativan and morphine.  Request to have dose adjustment for his increased pain and sob.  I gave ok for one time order for additional morphine and ativan.  She will address if changes in scheduled medications are warranted with pcp in am of 01/22/17

## 2017-01-27 ENCOUNTER — Ambulatory Visit: Payer: Medicare Other

## 2017-01-29 ENCOUNTER — Inpatient Hospital Stay: Payer: Medicare Other

## 2017-01-29 ENCOUNTER — Inpatient Hospital Stay: Payer: Medicare Other | Admitting: Oncology

## 2017-01-29 DEATH — deceased

## 2017-02-09 NOTE — Addendum Note (Signed)
Encounter addended by: Vernie Murders, RT on: 02/09/2017 10:42 AM<BR>    Actions taken: Imaging Exam ended, Charge Capture section accepted

## 2017-03-12 ENCOUNTER — Other Ambulatory Visit: Payer: Self-pay | Admitting: Nurse Practitioner

## 2017-06-30 IMAGING — CT CT CHEST W/O CM
2 of 3 series · 15 of 36 positions shown, 18 images · non-contrast
Comparison: None.

CLINICAL DATA: History of lymphoma.

EXAM:
CT CHEST WITHOUT CONTRAST
TECHNIQUE: Multidetector CT imaging of the chest was performed following the
standard protocol without IV contrast.

[Series 3: lungs · axial · 0.71mm/px · z∈[-514,-210]mm · 12 of 180 slices shown, 15 images]
[im 14/180  mediastinal]
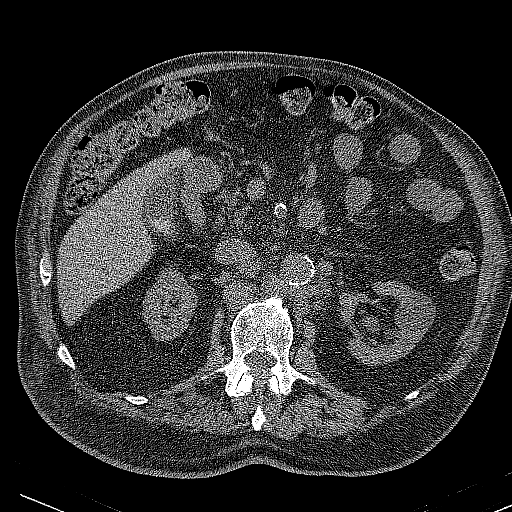
[im 14/180  lung]
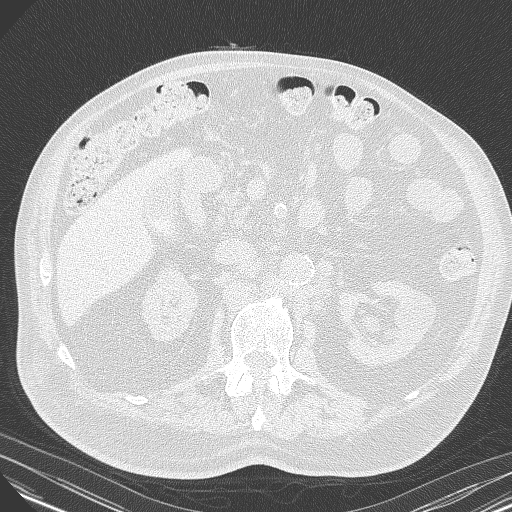
[im 27/180  lung]
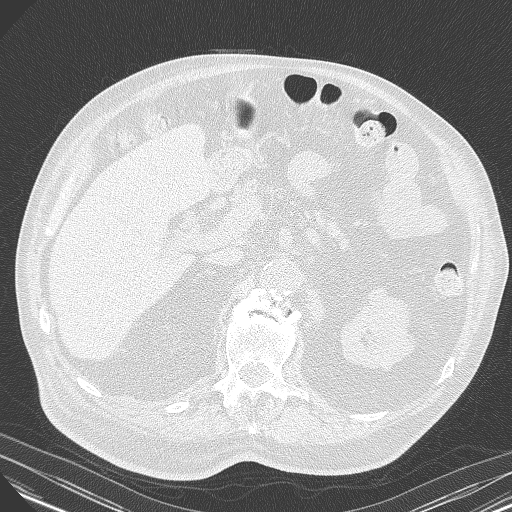
[im 40/180  lung]
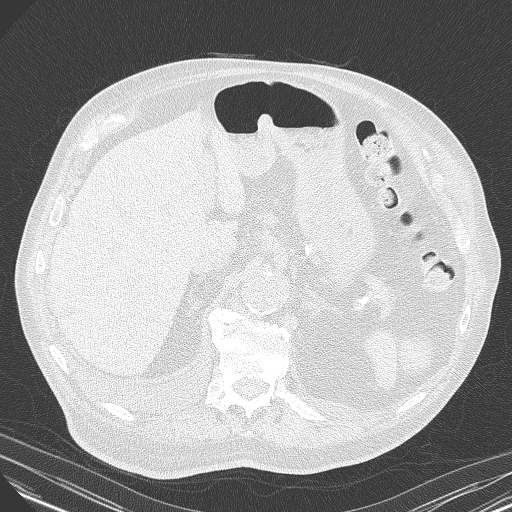
[im 54/180  lung]
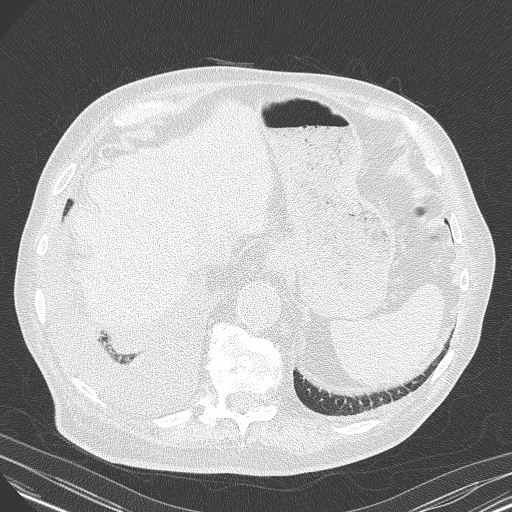
[im 67/180  mediastinal]
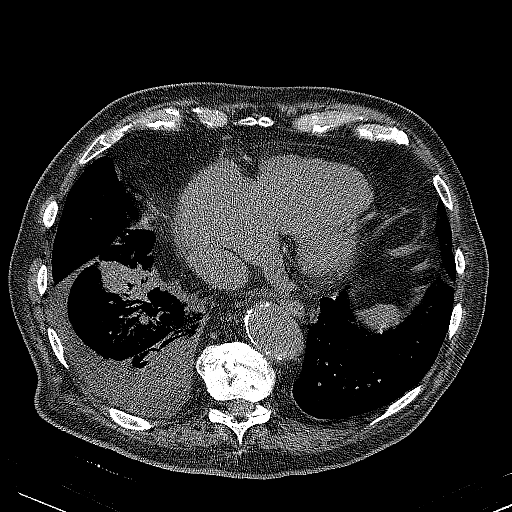
[im 67/180  lung]
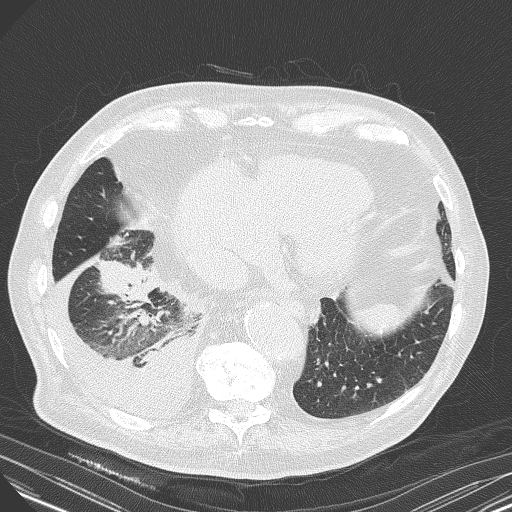
[im 80/180  lung]
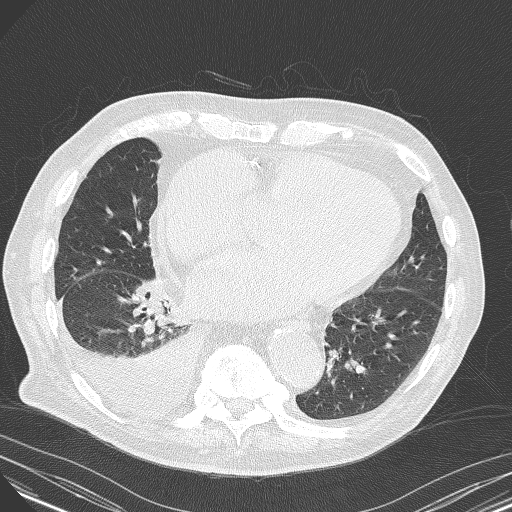
[im 100/180  lung]
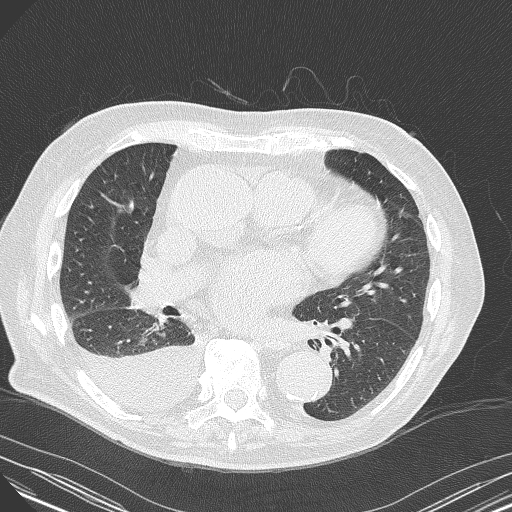
[im 113/180  lung]
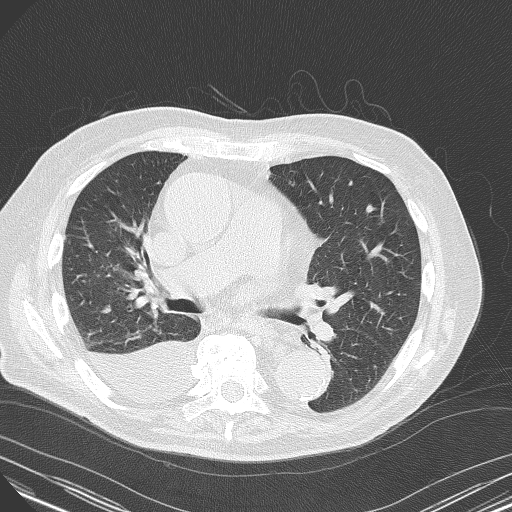
[im 126/180  mediastinal]
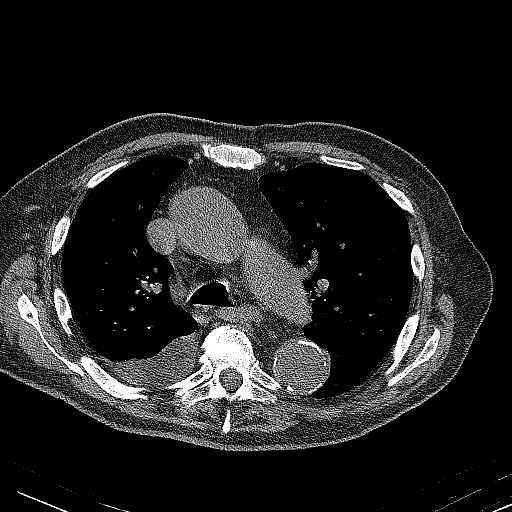
[im 126/180  lung]
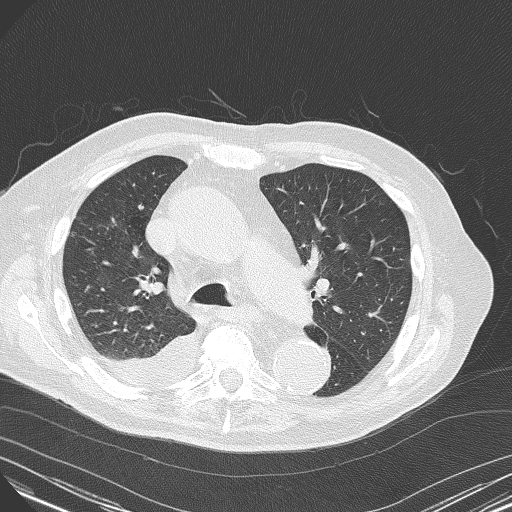
[im 140/180  lung]
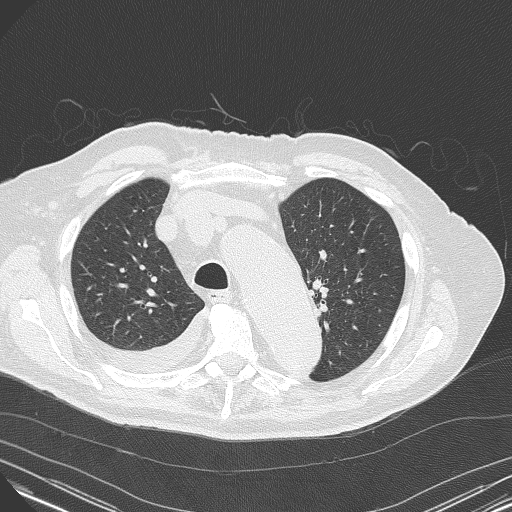
[im 153/180  lung]
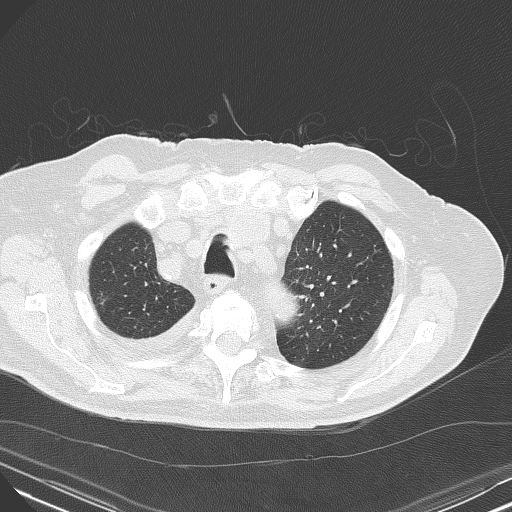
[im 166/180  lung]
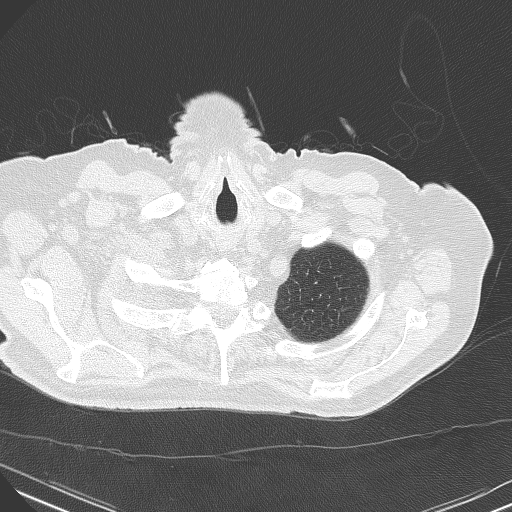

[Series 602: coronal · coronal · 0.71mm/px · 3 of 119 slices shown]
[im 24/119  lung]
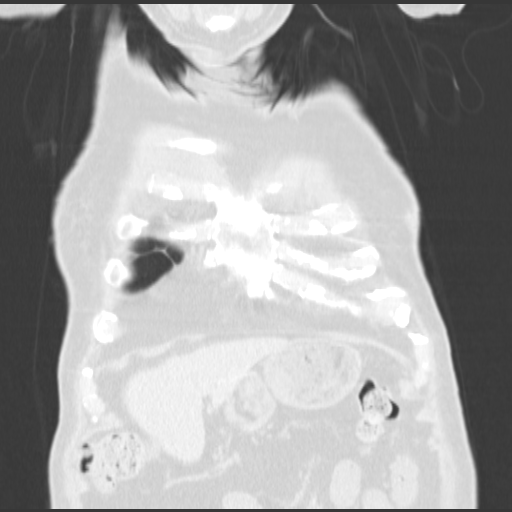
[im 48/119  lung]
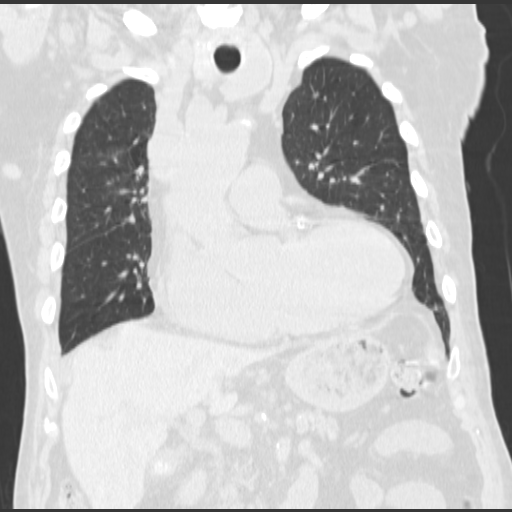
[im 71/119  lung]
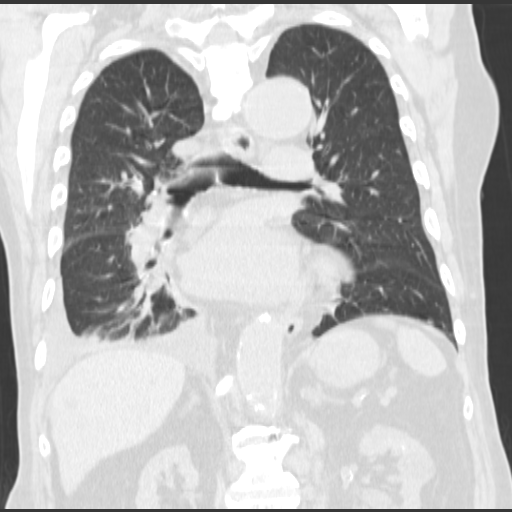

[15 of 36 positions shown; findings below may reference images not displayed]

FINDINGS: Cardiovascular: Moderate to marked cardiac enlargement. Aortic
atherosclerosis noted. Calcification in the LAD and RCA coronary
artery noted. No pericardial effusion.

Mediastinum/Nodes: No supraclavicular or axillary lymph nodes. No
significant mediastinal or hilar adenopathy.

Lungs/Pleura: Small to moderate right pleural effusion identified.
Airspace consolidation within the anterior right lower lobe is
identified, image 117 of series 3. Right middle lobe subpleural
nodule measures 4 mm, image 97 of series 3.

Upper Abdomen: No acute abnormality identified. Stones identified
within the gallbladder. There is abdominal aortic atherosclerosis.
Retroperitoneal adenopathy as described on CT from 11/04/2016.
Bilateral kidney cysts are present which are incompletely
characterized without IV contrast.

Musculoskeletal: No chest wall mass or suspicious bone lesions
identified.
IMPRESSION: 1. No significant thoracic adenopathy identified.
2. Cardiac enlargement, aortic atherosclerosis and multi vessel
coronary artery calcification.
3. Persistent right pleural effusion and right lower lobe airspace
consolidation.
4. Upper abdominal adenopathy as described on CT from 11/04/2016.

## 2017-07-10 IMAGING — DX DG CHEST 1V
1 series · 1 of 1 positions shown · non-contrast
Comparison: 11/18/2016

CLINICAL DATA: Cough, shortness of breath, and wheezing.  Lymphoma.

EXAM:
CHEST 1 VIEW

[chest ap]
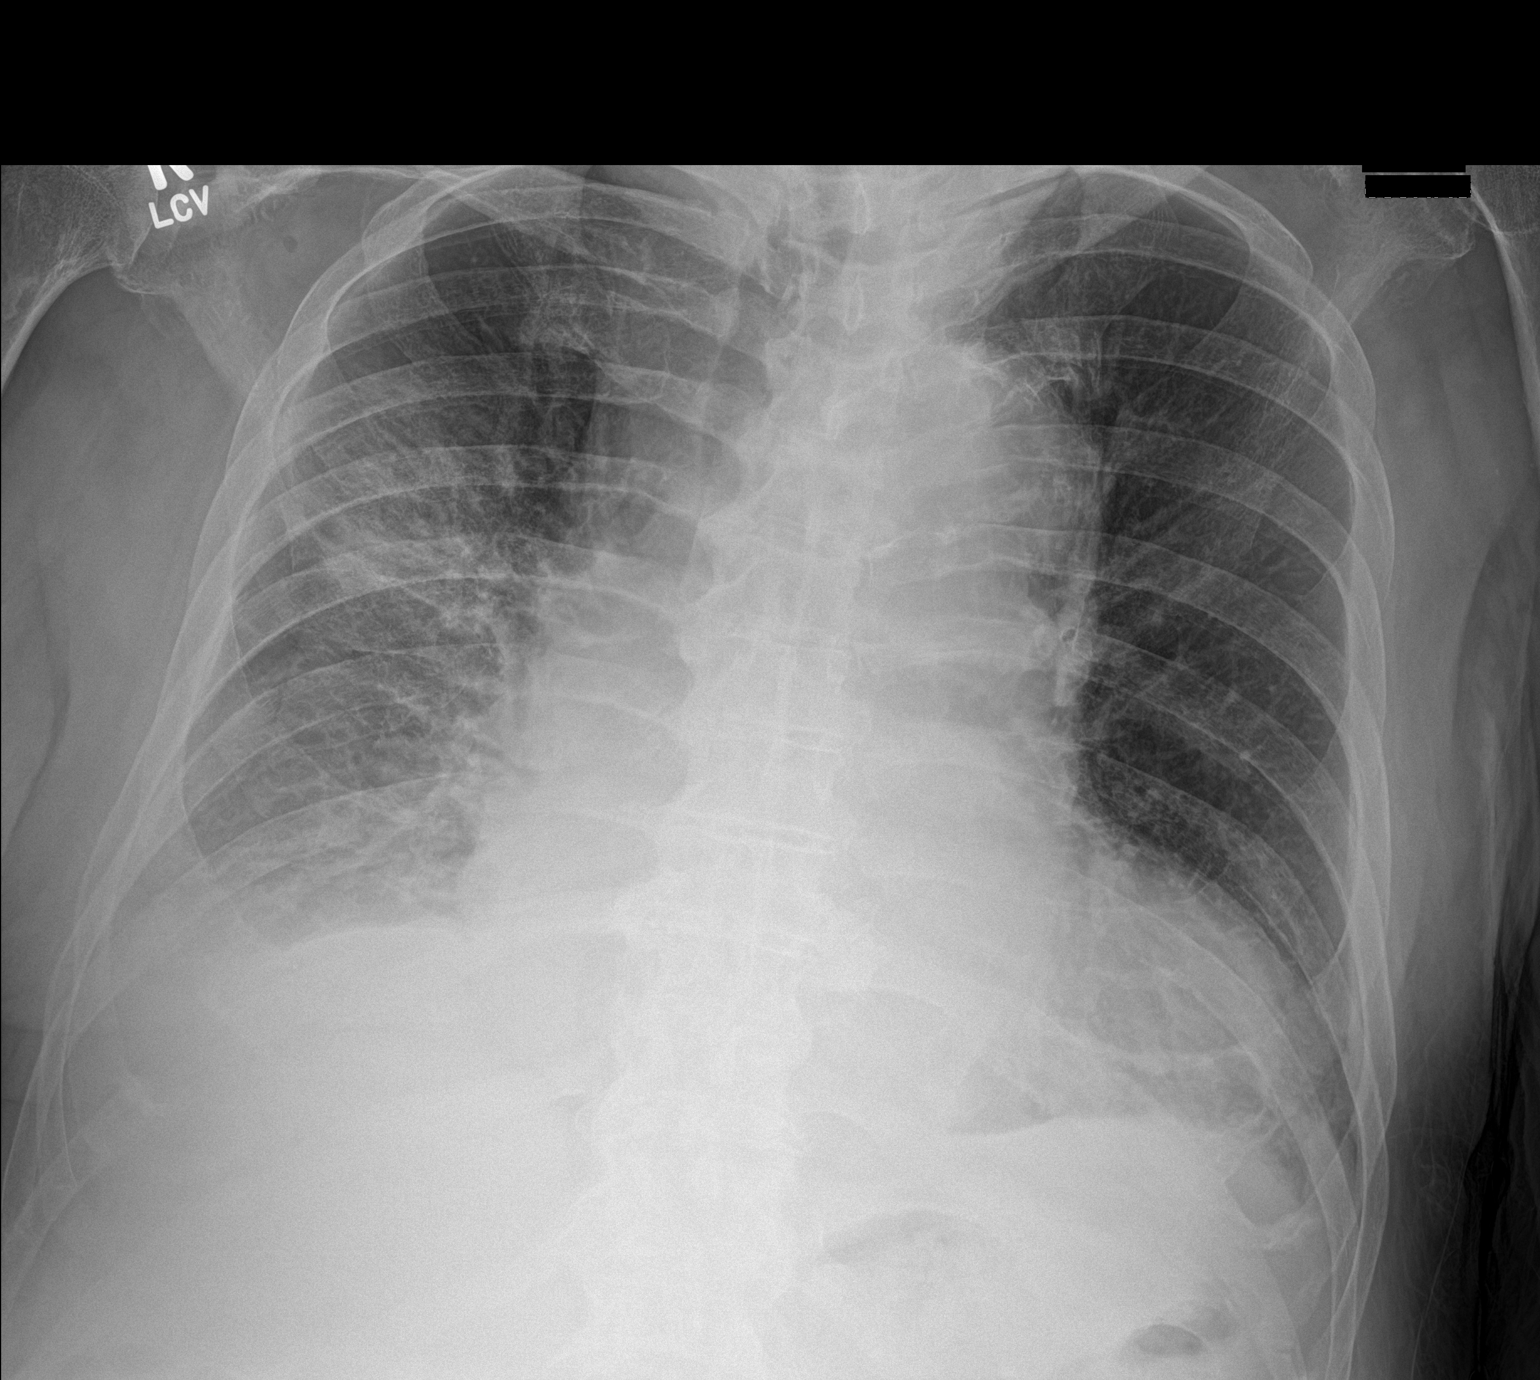

[1 of 1 positions shown; findings below may reference images not displayed]

FINDINGS: The cardiac silhouette remains enlarged. Aortic atherosclerosis is
noted. There is a persistent small right pleural effusion with right
lower lobe airspace consolidation, similar to the prior study
allowing for differences in patient positioning. Right upper lobe
airspace consolidation has improved. Minimal left basilar opacity
likely reflects atelectasis. No pneumothorax.
IMPRESSION: 1. Unchanged right pleural effusion and right lower lobe
consolidation compatible with pneumonia. Improved right upper lobe
consolidation.
2. Aortic atherosclerosis.

## 2017-09-01 IMAGING — CR DG CHEST 2V
1 series · 2 of 2 positions shown · non-contrast
Comparison: 12/09/2016

CLINICAL DATA: Recent discharge from hospital. Now increased
shortness of breath. History of CHF and lymphoma.

EXAM:
CHEST  2 VIEW

[Series 1: w chest lat · 0.14mm/px · 2 of 2 slices shown]
[im 1/2]
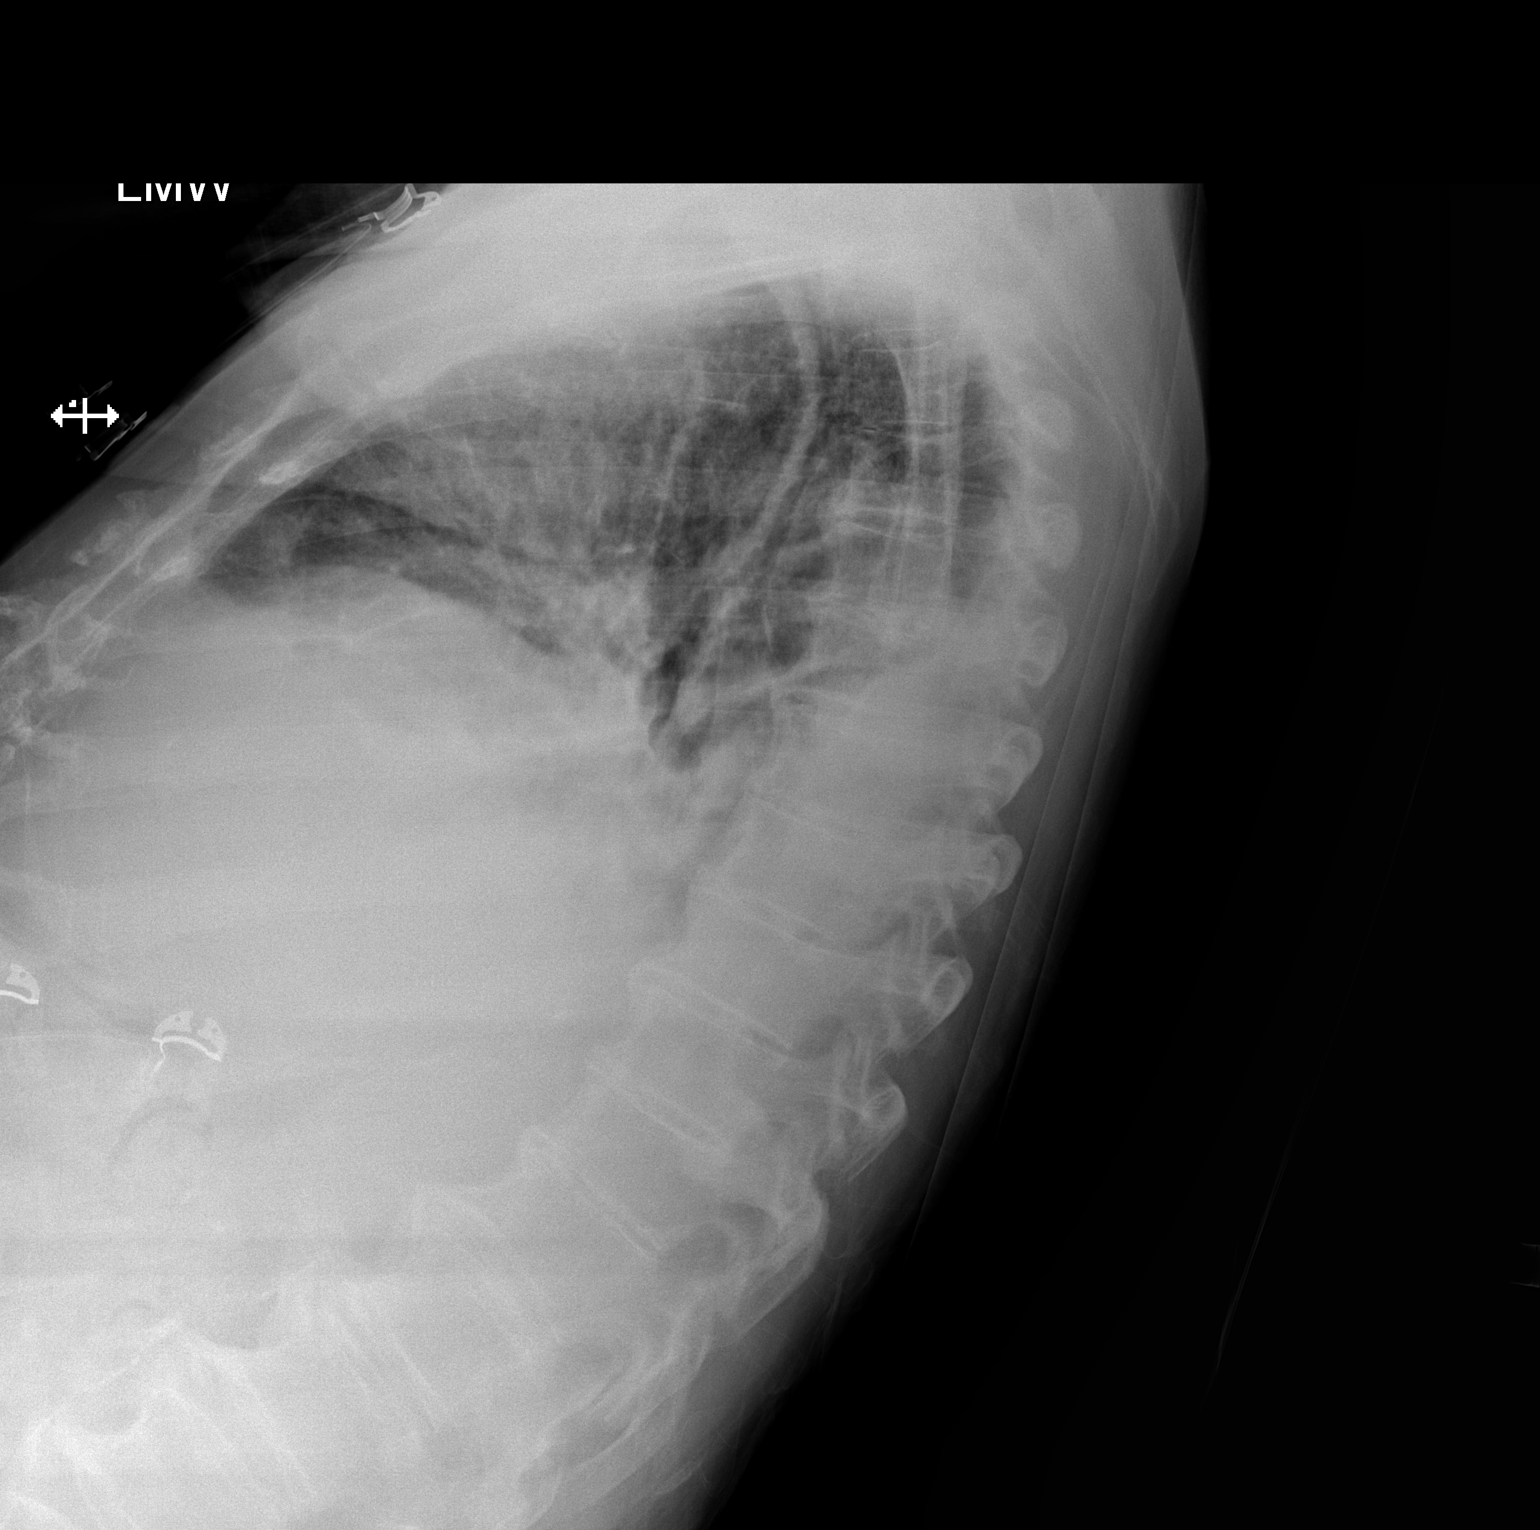
[im 2/2]
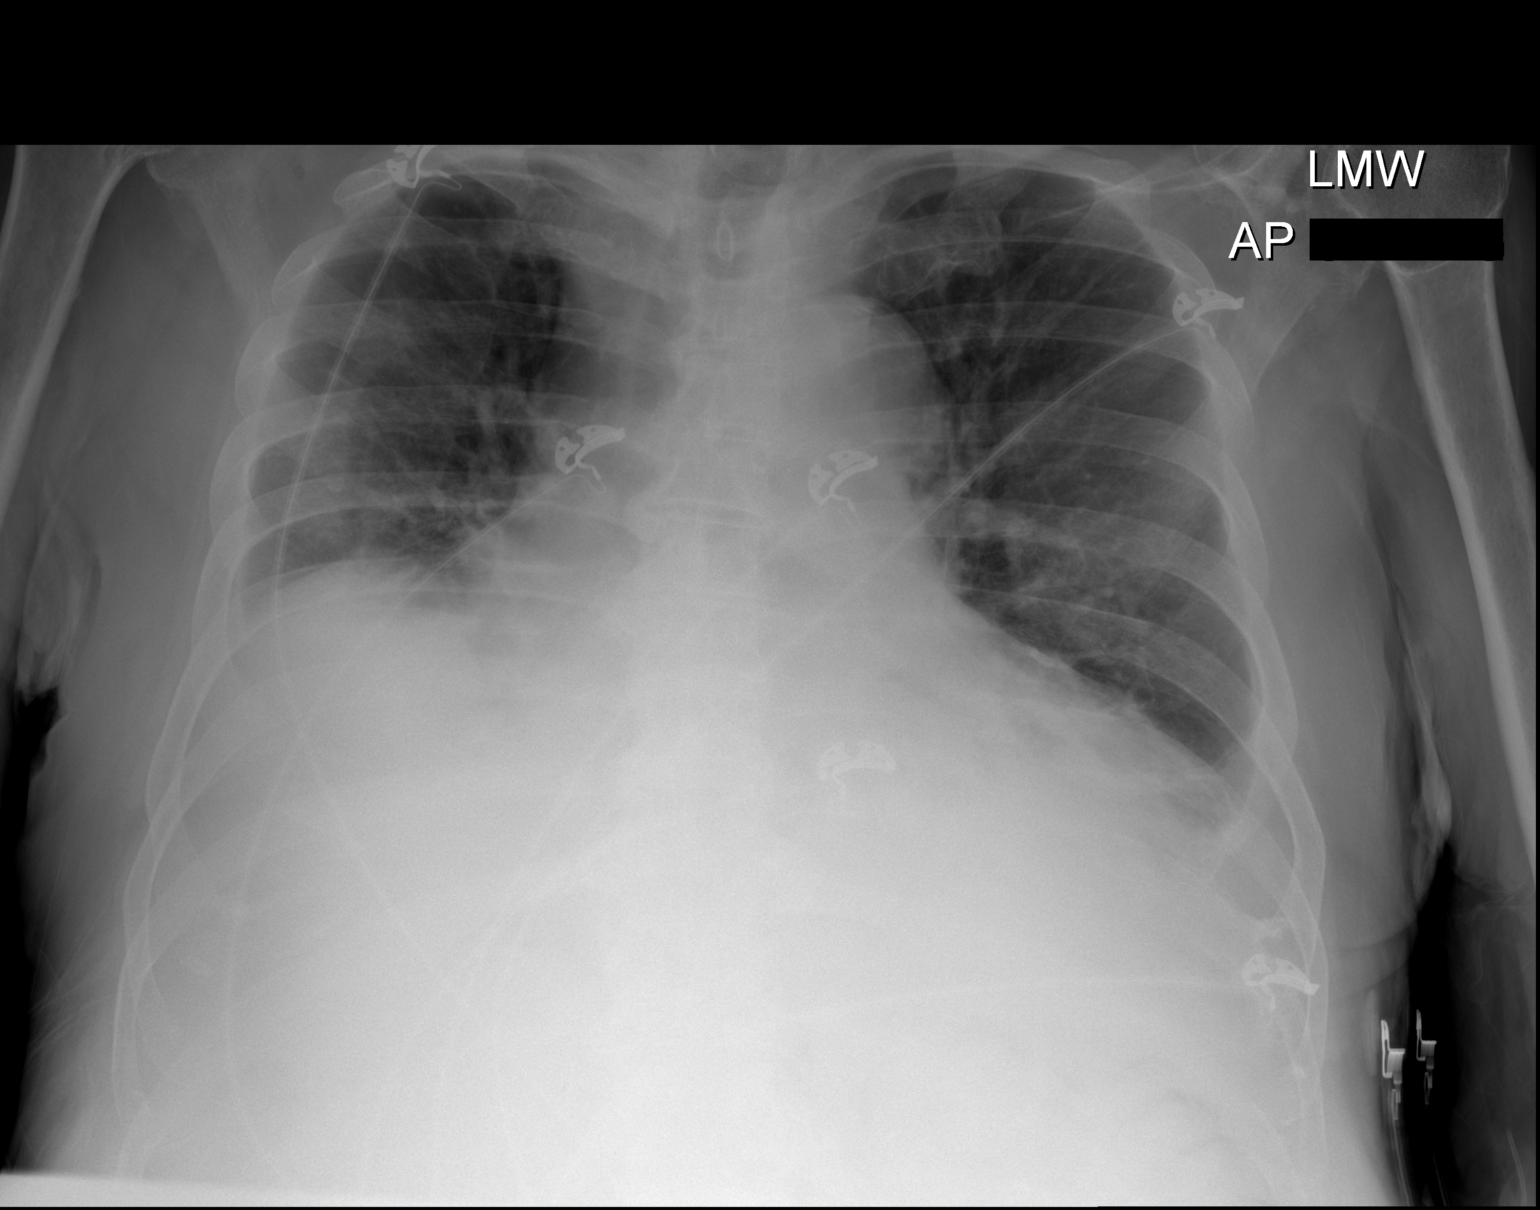

[2 of 2 positions shown; findings below may reference images not displayed]

FINDINGS: Shallow inspiration. Small to moderate bilateral pleural effusions
with atelectasis or infiltration in the lung bases. This is
progressing since previous study. Cardiac silhouette is somewhat
obscured by the parenchymal process but heart size appears enlarged.
No vascular congestion. No pneumothorax. Tortuous aorta.
Degenerative changes in the spine.
IMPRESSION: Small to moderate bilateral pleural effusions with basilar
atelectasis or consolidation bilaterally. Cardiac enlargement.
Changes are progressing since previous study.

## 2018-04-09 IMAGING — US US BIOPSY
1 series · 12 of 12 positions shown · non-contrast
Comparison: none

CLINICAL DATA: Right-sided inguinal, pelvic and abdominal
retroperitoneal lymphadenopathy. The patient presents for
ultrasound-guided biopsy of enlarged right inguinal lymph nodes.

[Series 1: us biopsy · 0.08mm/px · 12 of 12 slices shown]
[im 1/12]
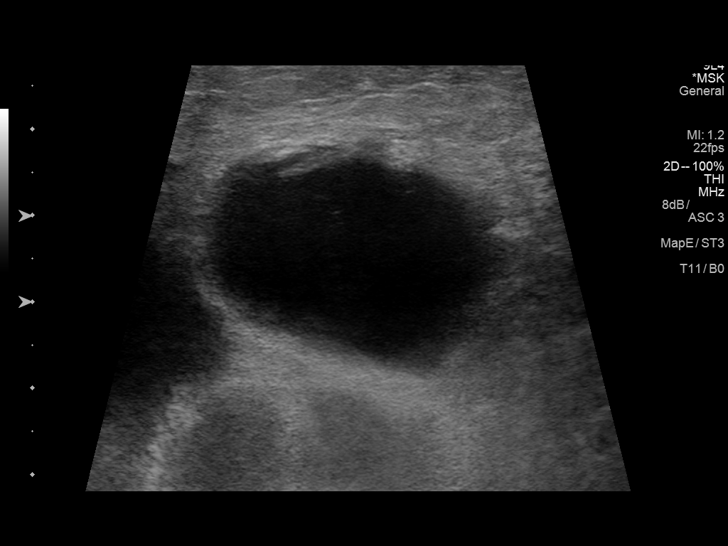
[im 2/12]
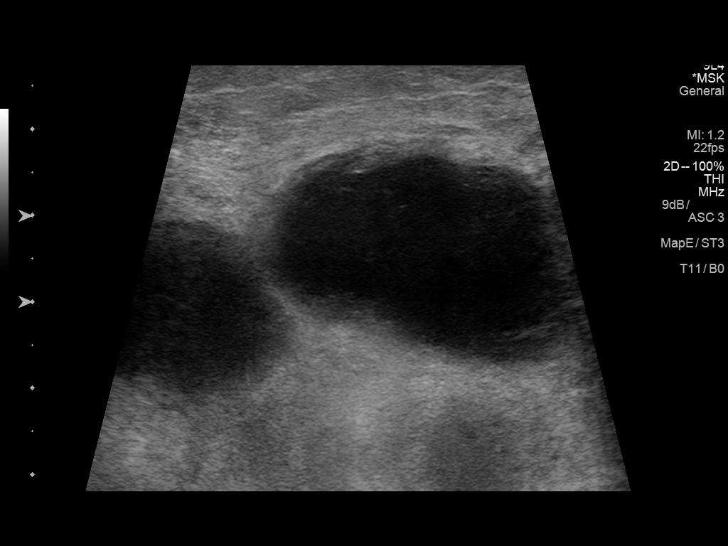
[im 3/12]
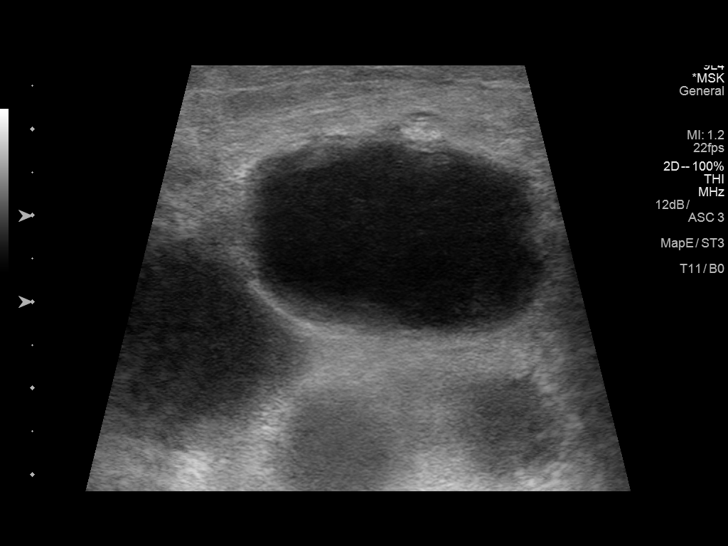
[im 4/12]
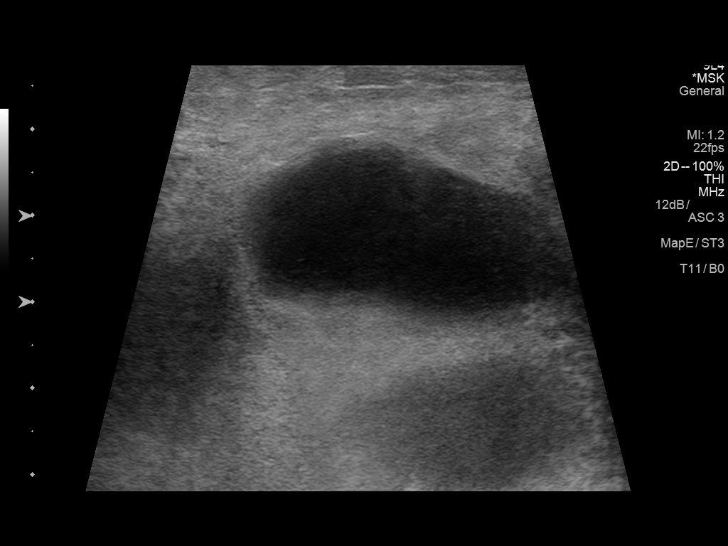
[im 5/12]
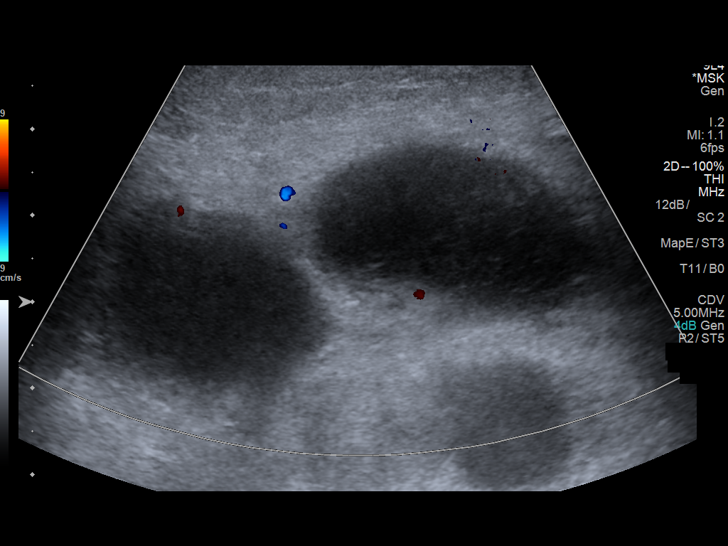
[im 6/12]
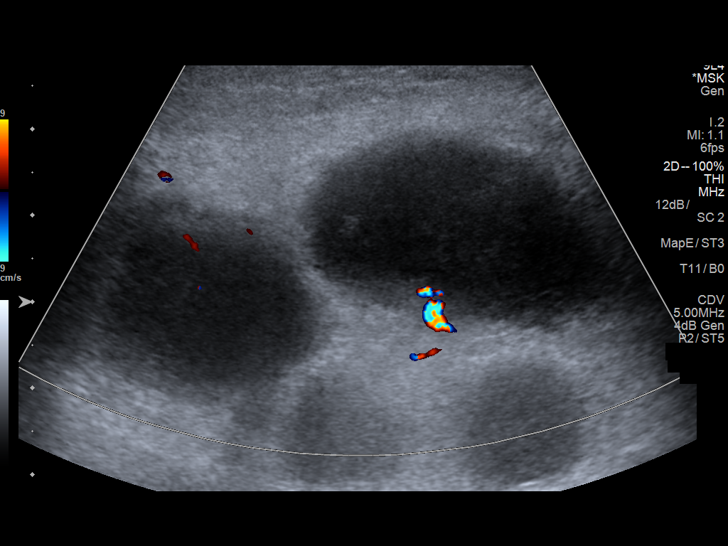
[im 7/12]
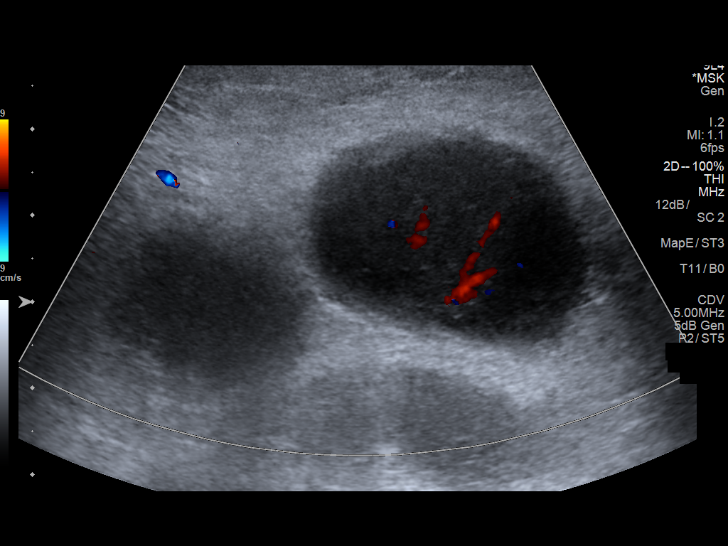
[im 8/12]
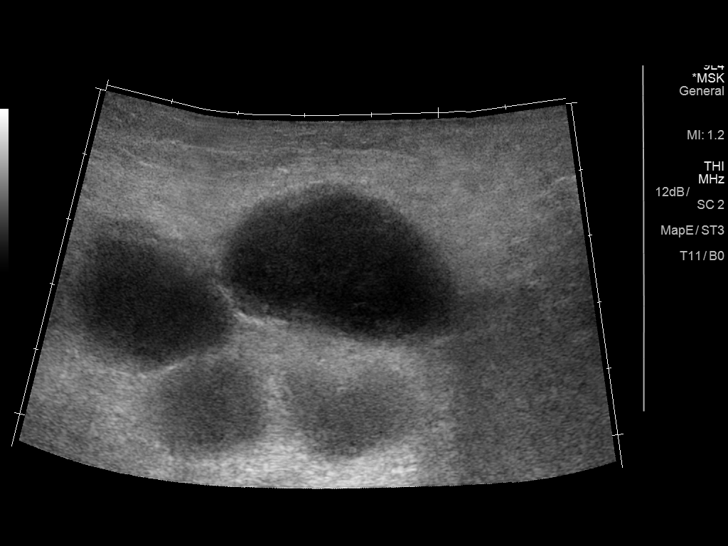
[im 9/12]
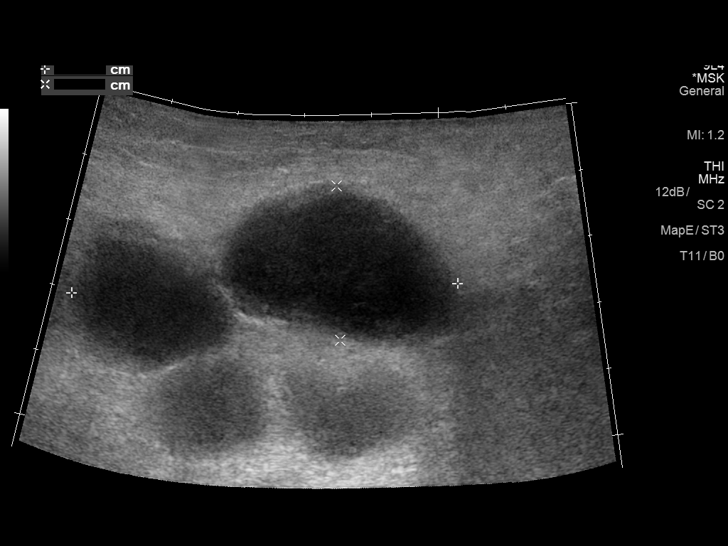
[im 10/12]
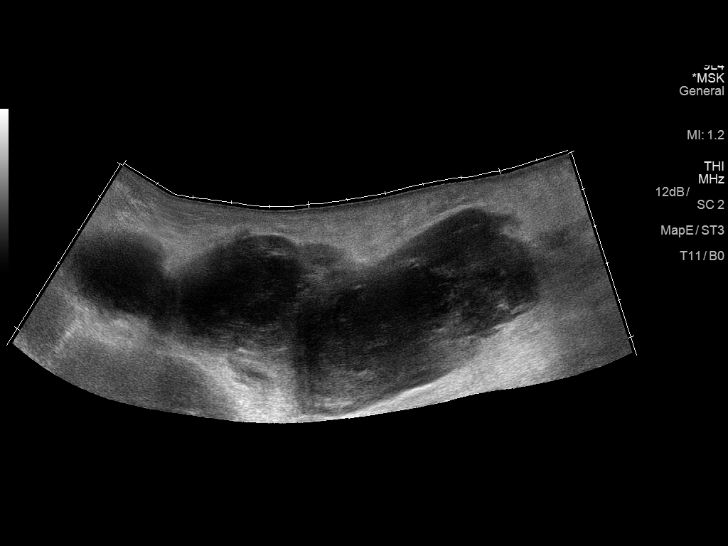
[im 11/12]
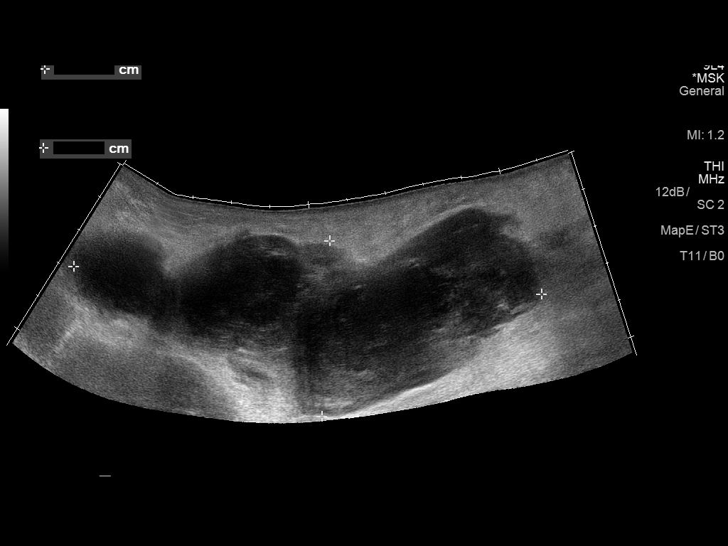
[im 12/12]
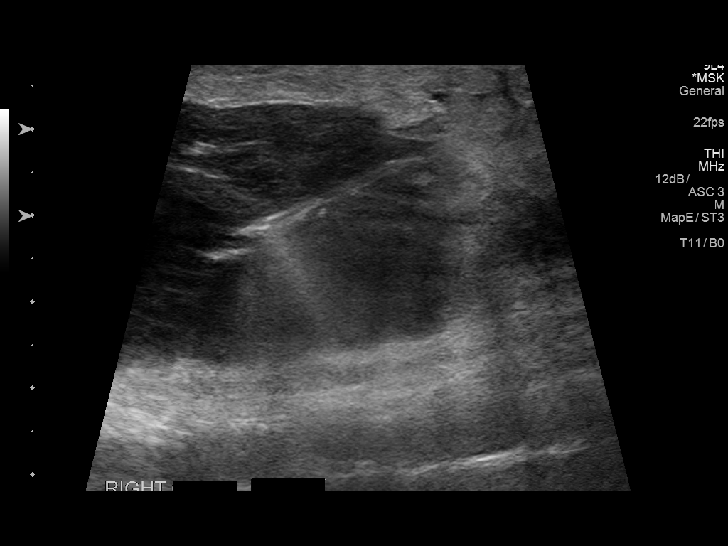

[12 of 12 positions shown; findings below may reference images not displayed]

EXAM:
ULTRASOUND GUIDED CORE BIOPSY OF RIGHT INGUINAL LYMPH NODE MASS

MEDICATIONS:
0.5 mg IV Versed; 25 mcg IV Fentanyl

Total Moderate Sedation Time: 19 minutes.

The patient's level of consciousness and physiologic status were
continuously monitored during the procedure by Radiology nursing.

PROCEDURE:
The procedure, risks, benefits, and alternatives were explained to
the patient. Questions regarding the procedure were encouraged and
answered. The patient understands and consents to the procedure. A
time out was performed prior to initiating the procedure.

The right groin region was prepped with chlorhexidine in a sterile
fashion, and a sterile drape was applied covering the operative
field. A sterile gown and sterile gloves were used for the
procedure. Local anesthesia was provided with 1% Lidocaine.

Ultrasound was used to localize right inguinal lymphadenopathy.
Under direct ultrasound guidance, a total of 5 separate 16 gauge
core biopsy samples were obtained in different portions of an
enlarged lymph node mass in the lower right inguinal region
extending into the proximal right thigh. Material was submitted on
T[place]a soaked in saline.

COMPLICATIONS:
None.
FINDINGS: Multiple enlarged hypoechoic lymph nodes are identified in the right
groin and proximal right thigh. Solid tissue was obtained from a
dominant enlarged lymph node measuring approximately 7 cm in
greatest length.
IMPRESSION: Ultrasound-guided core biopsy performed of enlarged right inguinal
lymph node.
